# Patient Record
Sex: Female | Born: 1940
Health system: Southern US, Community
[De-identification: ages and names within clinical notes are randomized; demographics above are authoritative.]

## PROBLEM LIST (undated history)

## (undated) DIAGNOSIS — F32A Depression, unspecified: Secondary | ICD-10-CM

## (undated) DIAGNOSIS — R5383 Other fatigue: Secondary | ICD-10-CM

## (undated) DIAGNOSIS — M8580 Other specified disorders of bone density and structure, unspecified site: Secondary | ICD-10-CM

## (undated) DIAGNOSIS — M1990 Unspecified osteoarthritis, unspecified site: Secondary | ICD-10-CM

## (undated) DIAGNOSIS — F329 Major depressive disorder, single episode, unspecified: Secondary | ICD-10-CM

## (undated) DIAGNOSIS — M810 Age-related osteoporosis without current pathological fracture: Secondary | ICD-10-CM

## (undated) DIAGNOSIS — F419 Anxiety disorder, unspecified: Secondary | ICD-10-CM

## (undated) DIAGNOSIS — R51 Headache: Secondary | ICD-10-CM

## (undated) DIAGNOSIS — K219 Gastro-esophageal reflux disease without esophagitis: Secondary | ICD-10-CM

## (undated) DIAGNOSIS — G250 Essential tremor: Secondary | ICD-10-CM

## (undated) DIAGNOSIS — K648 Other hemorrhoids: Secondary | ICD-10-CM

## (undated) DIAGNOSIS — C4490 Unspecified malignant neoplasm of skin, unspecified: Secondary | ICD-10-CM

## (undated) DIAGNOSIS — G8929 Other chronic pain: Secondary | ICD-10-CM

## (undated) DIAGNOSIS — R519 Headache, unspecified: Secondary | ICD-10-CM

## (undated) DIAGNOSIS — R42 Dizziness and giddiness: Secondary | ICD-10-CM

## (undated) DIAGNOSIS — M545 Low back pain, unspecified: Secondary | ICD-10-CM

## (undated) DIAGNOSIS — E785 Hyperlipidemia, unspecified: Secondary | ICD-10-CM

## (undated) HISTORY — PX: APPENDECTOMY: SHX54

## (undated) HISTORY — DX: Other specified disorders of bone density and structure, unspecified site: M85.80

## (undated) HISTORY — PX: LAMINECTOMY: SHX219

## (undated) HISTORY — DX: Age-related osteoporosis without current pathological fracture: M81.0

## (undated) HISTORY — DX: Headache, unspecified: R51.9

## (undated) HISTORY — DX: Low back pain, unspecified: M54.50

## (undated) HISTORY — DX: Anxiety disorder, unspecified: F41.9

## (undated) HISTORY — PX: LUMBAR SPINE SURGERY: SHX701

## (undated) HISTORY — DX: Other hemorrhoids: K64.8

## (undated) HISTORY — DX: Other fatigue: R53.83

## (undated) HISTORY — PX: COLONOSCOPY: SHX174

## (undated) HISTORY — PX: OTHER SURGICAL HISTORY: SHX169

## (undated) HISTORY — PX: TONSILLECTOMY: SUR1361

## (undated) HISTORY — DX: Unspecified osteoarthritis, unspecified site: M19.90

## (undated) HISTORY — DX: Essential tremor: G25.0

## (undated) HISTORY — DX: Depression, unspecified: F32.A

## (undated) HISTORY — DX: Major depressive disorder, single episode, unspecified: F32.9

## (undated) HISTORY — DX: Unspecified malignant neoplasm of skin, unspecified: C44.90

## (undated) HISTORY — DX: Other chronic pain: G89.29

## (undated) HISTORY — DX: Dizziness and giddiness: R42

## (undated) HISTORY — DX: Gastro-esophageal reflux disease without esophagitis: K21.9

## (undated) HISTORY — DX: Hyperlipidemia, unspecified: E78.5

## (undated) HISTORY — DX: Headache: R51

## (undated) HISTORY — PX: EYE SURGERY: SHX253

## (undated) HISTORY — PX: ABDOMINAL HYSTERECTOMY: SHX81

---

## 2001-04-20 ENCOUNTER — Other Ambulatory Visit: Admission: RE | Admit: 2001-04-20 | Discharge: 2001-04-20 | Payer: Self-pay | Admitting: Gynecology

## 2005-01-18 ENCOUNTER — Other Ambulatory Visit: Admission: RE | Admit: 2005-01-18 | Discharge: 2005-01-18 | Payer: Self-pay | Admitting: Gynecology

## 2005-04-15 ENCOUNTER — Ambulatory Visit: Payer: Self-pay | Admitting: Internal Medicine

## 2005-04-26 ENCOUNTER — Ambulatory Visit: Payer: Self-pay | Admitting: Internal Medicine

## 2005-10-21 ENCOUNTER — Encounter: Admission: RE | Admit: 2005-10-21 | Discharge: 2005-10-21 | Payer: Self-pay | Admitting: Internal Medicine

## 2005-11-05 ENCOUNTER — Encounter: Admission: RE | Admit: 2005-11-05 | Discharge: 2005-11-05 | Payer: Self-pay | Admitting: Internal Medicine

## 2005-12-10 ENCOUNTER — Encounter: Admission: RE | Admit: 2005-12-10 | Discharge: 2005-12-10 | Payer: Self-pay | Admitting: Internal Medicine

## 2007-09-05 ENCOUNTER — Encounter: Admission: RE | Admit: 2007-09-05 | Discharge: 2007-09-05 | Payer: Self-pay | Admitting: Internal Medicine

## 2009-05-08 ENCOUNTER — Encounter: Admission: RE | Admit: 2009-05-08 | Discharge: 2009-05-08 | Payer: Self-pay | Admitting: Internal Medicine

## 2009-07-02 ENCOUNTER — Emergency Department (HOSPITAL_COMMUNITY): Admission: EM | Admit: 2009-07-02 | Discharge: 2009-07-02 | Payer: Self-pay | Admitting: Emergency Medicine

## 2009-07-04 ENCOUNTER — Encounter: Admission: RE | Admit: 2009-07-04 | Discharge: 2009-07-04 | Payer: Self-pay | Admitting: Emergency Medicine

## 2009-07-15 ENCOUNTER — Ambulatory Visit (HOSPITAL_COMMUNITY): Admission: RE | Admit: 2009-07-15 | Discharge: 2009-07-15 | Payer: Self-pay | Admitting: Neurosurgery

## 2010-03-19 ENCOUNTER — Encounter: Admission: RE | Admit: 2010-03-19 | Discharge: 2010-03-19 | Payer: Self-pay | Admitting: Internal Medicine

## 2010-12-26 ENCOUNTER — Encounter (HOSPITAL_BASED_OUTPATIENT_CLINIC_OR_DEPARTMENT_OTHER): Payer: Self-pay | Admitting: Internal Medicine

## 2011-03-13 LAB — CBC
HCT: 36.2 % (ref 36.0–46.0)
Hemoglobin: 12.2 g/dL (ref 12.0–15.0)
MCHC: 33.6 g/dL (ref 30.0–36.0)
MCV: 96 fL (ref 78.0–100.0)
Platelets: 162 10*3/uL (ref 150–400)
RBC: 3.77 MIL/uL — ABNORMAL LOW (ref 3.87–5.11)
RDW: 13 % (ref 11.5–15.5)
WBC: 5.6 10*3/uL (ref 4.0–10.5)

## 2011-04-20 NOTE — Op Note (Signed)
NAMESAMANTHAN, DUGO             ACCOUNT NO.:  [id number]   MEDICAL RECORD NO.:  [id number]          PATIENT TYPE:  OIB   LOCATION:  3526                         FACILITY:  [facility]   PHYSICIAN:  Reinaldo Meeker, M.D. DATE OF BIRTH:  21-Oct-1941   DATE OF PROCEDURE:  07/15/2009  DATE OF DISCHARGE:                               OPERATIVE REPORT   PREOPERATIVE DIAGNOSIS:  Herniated disk L4-5 right.   POSTOPERATIVE DIAGNOSIS:  Herniated disk L4-5 right.   PROCEDURE:  Right L4-5 interlaminar laminotomy for excision of herniated  disk with operative microscope.   SURGEON:  Reinaldo Meeker, M.D.   ASSISTANT:  Donalee Citrin, M.D.   PROCEDURE IN DETAIL:  After placing in the prone position, the patient's  back was prepped and draped in usual sterile fashion.  Localizing x-rays  taken prior to incision to identify the appropriate level.  Midline  incision was made above the spinous process of L4 and L5.  Using Bovie  cutting current, the incision was carried down the spinous processes.  Subperiosteal dissection was then carried out on the right side of the  spinous processes and lamina.  Self-retaining retractor was placed for  exposure.  X-rays showed approach to the appropriate level.  Using the  high-speed drill, the inferior one-third of the L4 lamina, medial one-  third of the facet joint and superior one-third of the L5 lamina were  removed.  Residual bone and ligamentum flavum removed in a piecemeal  fashion.  The microscope was draped and brought into the field and used  through the remainder of the case.  Using microdissection technique, the  lateral aspect of the thecal sac and L5 nerve were identified.  Further  coagulation was carried out down the floor of the canal to identify the  L4-5 disk which was found to be remarkably herniated.  It appeared  through the disk space, there was some free disk material and this was  removed.  The disk was then incised with a 15 blade and  thoroughly  cleaned out with pituitary rongeurs and curettes.  This time inspection  was carried out in all directions for any evidence of residual  compression and none could be identified.  Large amounts of irrigation  carried out and any bleeding controlled with bipolar coagulation and  Gelfoam.  The wound was then closed in multiple layers of Vicryl in the  muscle fascia, subcutaneous, subcuticular tissues and staples were  placed on the skin.  A sterile dressing was then applied, and the  patient was extubated and taken to recovery room in stable condition.           ______________________________  Reinaldo Meeker, M.D.     ROK/MEDQ  D:  07/15/2009  T:  07/15/2009  Job:  [id number]

## 2011-08-11 ENCOUNTER — Other Ambulatory Visit: Payer: Self-pay | Admitting: Otolaryngology

## 2011-08-13 ENCOUNTER — Ambulatory Visit
Admission: RE | Admit: 2011-08-13 | Discharge: 2011-08-13 | Disposition: A | Discharge status: Home / Self Care | Payer: PRIVATE HEALTH INSURANCE | Source: Ambulatory Visit | Attending: Otolaryngology | Admitting: Otolaryngology

## 2012-01-12 ENCOUNTER — Other Ambulatory Visit (HOSPITAL_COMMUNITY): Payer: Self-pay | Admitting: Internal Medicine

## 2012-01-12 DIAGNOSIS — Z139 Encounter for screening, unspecified: Secondary | ICD-10-CM

## 2012-01-17 ENCOUNTER — Ambulatory Visit (HOSPITAL_COMMUNITY)
Admission: RE | Admit: 2012-01-17 | Discharge: 2012-01-17 | Disposition: A | Discharge status: Home / Self Care | Payer: Medicare Other | Source: Ambulatory Visit | Attending: Internal Medicine | Admitting: Internal Medicine

## 2012-01-17 DIAGNOSIS — Z139 Encounter for screening, unspecified: Secondary | ICD-10-CM

## 2012-01-17 DIAGNOSIS — Z1231 Encounter for screening mammogram for malignant neoplasm of breast: Secondary | ICD-10-CM | POA: Insufficient documentation

## 2012-08-02 DIAGNOSIS — Z79899 Other long term (current) drug therapy: Secondary | ICD-10-CM | POA: Diagnosis not present

## 2012-08-02 DIAGNOSIS — E785 Hyperlipidemia, unspecified: Secondary | ICD-10-CM | POA: Diagnosis not present

## 2012-08-02 DIAGNOSIS — E039 Hypothyroidism, unspecified: Secondary | ICD-10-CM | POA: Diagnosis not present

## 2012-08-09 DIAGNOSIS — Z0000 Encounter for general adult medical examination without abnormal findings: Secondary | ICD-10-CM | POA: Diagnosis not present

## 2012-08-09 DIAGNOSIS — Z23 Encounter for immunization: Secondary | ICD-10-CM | POA: Diagnosis not present

## 2012-08-09 DIAGNOSIS — M899 Disorder of bone, unspecified: Secondary | ICD-10-CM | POA: Diagnosis not present

## 2012-08-09 DIAGNOSIS — E785 Hyperlipidemia, unspecified: Secondary | ICD-10-CM | POA: Diagnosis not present

## 2012-08-09 DIAGNOSIS — R1312 Dysphagia, oropharyngeal phase: Secondary | ICD-10-CM | POA: Diagnosis not present

## 2012-08-31 ENCOUNTER — Encounter: Payer: Self-pay | Admitting: *Deleted

## 2012-09-01 ENCOUNTER — Encounter: Payer: Self-pay | Admitting: Internal Medicine

## 2012-09-19 ENCOUNTER — Encounter: Payer: Self-pay | Admitting: Internal Medicine

## 2012-09-19 ENCOUNTER — Ambulatory Visit (INDEPENDENT_AMBULATORY_CARE_PROVIDER_SITE_OTHER): Payer: Medicare Other | Admitting: Internal Medicine

## 2012-09-19 VITALS — BP 140/80 | HR 68 | Ht 66.5 in | Wt 135.4 lb

## 2012-09-19 DIAGNOSIS — K219 Gastro-esophageal reflux disease without esophagitis: Secondary | ICD-10-CM | POA: Diagnosis not present

## 2012-09-19 MED ORDER — OMEPRAZOLE 40 MG PO CPDR: 40.0000 mg | DELAYED_RELEASE_CAPSULE | Freq: Every day | ORAL | Status: DC

## 2012-09-19 NOTE — Patient Instructions (Addendum)
You have been scheduled for an endoscopy with propofol. Please follow written instructions given to you at your visit today. If you use inhalers (even only as needed), please bring them with you on the day of your procedure. We have sent the following medications to your pharmacy for you to pick up at your convenience: Omeprazole CC: Dr Jarome Matin

## 2012-09-19 NOTE — Progress Notes (Signed)
Suzanne Leon 1941-02-25 MRN [id number]        History of Present Illness:  This is a 71 year old white female with  solid food dysphagia. This has been getting gradually worse for past several years. We saw her in 2006 for screening colonoscopy and constipation. She was found to have internal hemorrhoids. Barium esophagram in September 2012 showed normal peristalsis. No reflux. 13 mm tablet past without delay. Her complaints are mostly related to meals. Several minutes after finishing meal the food seems to regurgitate  all the way up to her mouth; this is worse if she bends over. She denies nocturnal cough or hoarseness. Her barium swallow showed  transient silent laryngeal penetration. She avoids taking anti-inflammatory agents because of heartburn. She has been under a lot of stress due. to  her daughter's illness. She  recently passed away. Patient  is about 10 pounds below her usual weight. She has involuntary tremor involving her head and neck which was evaluated by Dr. Sandria Manly about 8 years ago but no definite diagnosis was made, she denies having any neurological diseases such  MS stroke or neuropathy. She describes severe burning substernally after food and acid.regurgitates. She has taken TUMS and Rolaids with only partial relief  Past Medical History  Diagnosis Date  . Internal hemorrhoids   . Anxiety   . Skin cancer   . Chronic headaches   . Depression   . HLD (hyperlipidemia)    Past Surgical History  Procedure Date  . Laminectomy   . Appendectomy   . Abdominal hysterectomy   . Tonsillectomy     reports that she has quit smoking. Her smoking use included Cigarettes. She has never used smokeless tobacco. She reports that she does not drink alcohol or use illicit drugs. family history includes Breast cancer in her daughter; Throat cancer in her mother; and Ulcers in her father. No Known Allergies      Review of Systems:  The remainder of the 10 point ROS is negative  except as outlined in H&P   Physical Exam: General appearance  Well developed, in no distress. Eyes- non icteric. HEENT nontraumatic, normocephalic. Mouth no lesions, tongue papillated, no cheilosis. Neck supple without adenopathy, thyroid not enlarged, no carotid bruits, no JVD. Lungs Clear to auscultation bilaterally. Cor normal S1, normal S2, regular rhythm, no murmur,  quiet precordium. Abdomen: soft, non tender, active bowl sounds Rectal: Not done Extremities no pedal edema. Skin no lesions. Neurological alert and oriented x 3. Voluntary bobbing of her head, no hand or leg tremor. Psychological normal mood and affect.  Assessment and Plan:  Dysphagia and substernal burning  consistent with severe gastroesophageal reflux. This has developed gradually over past several years and it is consistent with esophageal dysmotility in absence of esophageal stricture on Barium esophagram.. Her barium esophagram one year of old was completely normal which is hard to believe. We will start her on Prilosec 20 mg twice a day. I have given her samples of Prilosec because she doesn't have any insurance coverage. We will schedule her for upper endoscopy to rule out Barrett's esophagus and to assess her gastric outlet to rule out GOO. She may in the future benefit from gastric emptying scan to r/o gastroparesis.. I would be reluctant to put her on metoclopramide at this point because of her neurological symptoms. Depending on the findings on the upper endoscopy I would consider adding Carafate or antacids for additional coating affect. She has been already  elevating the head of the  bed at night .Esophageal manometry may be indicated in order to assess esophageal peristalsis..She may need to follow up with Dr Sandria Manly with respect to a neurological disorder underlying her tremor .   09/19/2012 [name]

## 2012-10-03 DIAGNOSIS — M810 Age-related osteoporosis without current pathological fracture: Secondary | ICD-10-CM | POA: Diagnosis not present

## 2012-10-03 DIAGNOSIS — R1312 Dysphagia, oropharyngeal phase: Secondary | ICD-10-CM | POA: Diagnosis not present

## 2012-11-14 ENCOUNTER — Telehealth: Payer: Self-pay | Admitting: *Deleted

## 2012-11-14 ENCOUNTER — Encounter: Payer: Self-pay | Admitting: Internal Medicine

## 2012-11-14 ENCOUNTER — Other Ambulatory Visit: Payer: Self-pay | Admitting: *Deleted

## 2012-11-14 ENCOUNTER — Ambulatory Visit (AMBULATORY_SURGERY_CENTER): Payer: Medicare Other | Admitting: Internal Medicine

## 2012-11-14 VITALS — BP 121/62 | HR 54 | Temp 98.0°F | Resp 22 | Ht 66.0 in | Wt 135.0 lb

## 2012-11-14 DIAGNOSIS — K219 Gastro-esophageal reflux disease without esophagitis: Secondary | ICD-10-CM | POA: Diagnosis not present

## 2012-11-14 DIAGNOSIS — K2960 Other gastritis without bleeding: Secondary | ICD-10-CM

## 2012-11-14 DIAGNOSIS — F341 Dysthymic disorder: Secondary | ICD-10-CM | POA: Diagnosis not present

## 2012-11-14 DIAGNOSIS — R1319 Other dysphagia: Secondary | ICD-10-CM | POA: Diagnosis not present

## 2012-11-14 DIAGNOSIS — E785 Hyperlipidemia, unspecified: Secondary | ICD-10-CM | POA: Diagnosis not present

## 2012-11-14 MED ORDER — SODIUM CHLORIDE 0.9 % IV SOLN: 500.0000 mL | INTRAVENOUS | Status: DC

## 2012-11-14 NOTE — Progress Notes (Signed)
Called to room to assist during endoscopic procedure.  Patient ID and intended procedure confirmed with present staff. Received instructions for my participation in the procedure from the performing physician.  

## 2012-11-14 NOTE — Op Note (Signed)
[facility] [address] [place], [address]   ENDOSCOPY PROCEDURE REPORT  PATIENT: Suzanne Leon, Suzanne Leon  MR#: [id number] BIRTHDATE: Jan 01, 1941 , 71  yrs. old GENDER: Female ENDOSCOPIST: Hart Carwin, MD REFERRED BY:  Jarome Matin, M.D. PROCEDURE DATE:  11/14/2012 PROCEDURE:  EGD w/ biopsy and Maloney dilation of esophagus ASA CLASS:     Class III INDICATIONS:  Dysphagia.   History of esophageal reflux.   barium swallow recently showed ? penetration, GERD,. MEDICATIONS: MAC sedation, administered by CRNA and propofol (Diprivan) 200mg  IV TOPICAL ANESTHETIC: none  DESCRIPTION OF PROCEDURE: After the risks benefits and alternatives of the procedure were thoroughly explained, informed consent was obtained.  The LB GIF-H180 [id number] endoscope was introduced through the mouth and advanced to the second portion of the duodenum. Without limitations.  The instrument was slowly withdrawn as the mucosa was fully examined.      esophageal lumen was normal. There was no retained food or secretions. There was no evidence of esophageal stricture. Endoscope traversed into the stomach without resistance while acutely and piriform sinuses appeared normal the gastric mucosa was normal in the proximal portion. In the gastric antrum there were scattered erosions in the prepyloric antrum. Biopsies were taken to rule out H. pylori duodenal bulb and descending duodenum was normal Maloney dilators 48 [place] passed through esophagus without resistance there was no blood on the dilator      Retroflexed views revealed no abnormalities.     The scope was then withdrawn from the patient and the procedure completed.  COMPLICATIONS: There were no complications. ENDOSCOPIC IMPRESSION: essentially normal upper endoscopy without evidence of esophageal stricture ,status post passage of 48 French Maloney dilator without resistance Mild antral gastritis. Status post biopsies to rule out  H. pylori RECOMMENDATIONS: 1.  Anti-reflux regimen to be follow 2.  Continue PPI 3. schedule esophageal manometry to r/o dysmotility 4 .await biopsies  REPEAT EXAM: no recall EGD  eSigned:  Hart Carwin, MD 11/14/2012 1:38 PM   CC:  PATIENT NAME:  [name], [name] MR#: [id number]

## 2012-11-14 NOTE — Progress Notes (Signed)
Pt. Scheduled for esophageal manometry, January 13.  Instructed to not eat or drink anything after midnight prior to test. Location is [facility].   Patient did not experience any of the following events: a burn prior to discharge; a fall within the facility; wrong site/side/patient/procedure/implant event; or a hospital transfer or hospital admission upon discharge from the facility. [phone]) Patient did not have preoperative order for IV antibiotic SSI prophylaxis. [phone])

## 2012-11-14 NOTE — Patient Instructions (Addendum)
YOU HAD AN ENDOSCOPIC PROCEDURE TODAY AT THE [facility]: Refer to the procedure report that was given to you for any specific questions about what was found during the examination.  If the procedure report does not answer your questions, please call your gastroenterologist to clarify.  If you requested that your care partner not be given the details of your procedure findings, then the procedure report has been included in a sealed envelope for you to review at your convenience later.  YOU SHOULD EXPECT: Some feelings of bloating in the abdomen. Passage of more gas than usual.  Walking can help get rid of the air that was put into your GI tract during the procedure and reduce the bloating. If you had a lower endoscopy (such as a colonoscopy or flexible sigmoidoscopy) you may notice spotting of blood in your stool or on the toilet paper. If you underwent a bowel prep for your procedure, then you may not have a normal bowel movement for a few days.  DIET: Your first meal following the procedure should be a light meal and then it is ok to progress to your normal diet.  A half-sandwich or bowl of soup is an example of a good first meal.  Heavy or fried foods are harder to digest and may make you feel nauseous or bloated.  Likewise meals heavy in dairy and vegetables can cause extra gas to form and this can also increase the bloating.  Drink plenty of fluids but you should avoid alcoholic beverages for 24 hours.  ACTIVITY: Your care partner should take you home directly after the procedure.  You should plan to take it easy, moving slowly for the rest of the day.  You can resume normal activity the day after the procedure however you should NOT DRIVE or use heavy machinery for 24 hours (because of the sedation medicines used during the test).    SYMPTOMS TO REPORT IMMEDIATELY: A gastroenterologist can be reached at any hour.  During normal business hours, 8:30 AM to 5:00 PM Monday through Friday,  call [phone].  After hours and on weekends, please call the GI answering service at [phone] who will take a message and have the physician on call contact you.    Following upper endoscopy (EGD)  Vomiting of blood or coffee ground material  New chest pain or pain under the shoulder blades  Painful or persistently difficult swallowing  New shortness of breath  Fever of 100F or higher  Black, tarry-looking stools  FOLLOW UP: If any biopsies were taken you will be contacted by phone or by letter within the next 1-3 weeks.  Call your gastroenterologist if you have not heard about the biopsies in 3 weeks.  Our staff will call the home number listed on your records the next business day following your procedure to check on you and address any questions or concerns that you may have at that time regarding the information given to you following your procedure. This is a courtesy call and so if there is no answer at the home number and we have not heard from you through the emergency physician on call, we will assume that you have returned to your regular daily activities without incident.  SIGNATURES/CONFIDENTIALITY: You and/or your care partner have signed paperwork which will be entered into your electronic medical record.  These signatures attest to the fact that that the information above on your After Visit Summary has been reviewed and is understood.  Full   responsibility of the confidentiality of this discharge information lies with you and/or your care-partner.    Post dilation diet given with instructions  Gastritis information given.  Take Prilosec twice a day  Esophageal manometry to be scheduled.

## 2012-11-14 NOTE — Telephone Encounter (Signed)
Per Dr. Juanda Chance, patient needs esophageal manometry. Scheduled at [facility] endo Noreene Larsson) on 12/18/12 at 10:15 AM. NPO after midnight. Booking number [id number]. Erskine Squibb in [facility] notified and will give patient instructions.

## 2012-11-15 ENCOUNTER — Telehealth: Payer: Self-pay

## 2012-11-15 NOTE — Telephone Encounter (Signed)
  Follow up Call-  Call back number 11/14/2012  Post procedure Call Back phone  # [phone]  Permission to leave phone message Yes     Patient questions:  Do you have a fever, pain , or abdominal swelling? no Pain Score  0 *  Have you tolerated food without any problems? yes  Have you been able to return to your normal activities? yes  Do you have any questions about your discharge instructions: Diet   no Medications  no Follow up visit  no  Do you have questions or concerns about your Care? no  Actions: * If pain score is 4 or above: No action needed, pain <4.

## 2012-11-20 ENCOUNTER — Encounter: Payer: Self-pay | Admitting: Internal Medicine

## 2012-12-18 ENCOUNTER — Ambulatory Visit (HOSPITAL_COMMUNITY)
Admission: RE | Admit: 2012-12-18 | Discharge: 2012-12-18 | Disposition: A | Discharge status: Home / Self Care | Payer: Medicare Other | Source: Ambulatory Visit | Attending: Internal Medicine | Admitting: Internal Medicine

## 2012-12-18 ENCOUNTER — Encounter (HOSPITAL_COMMUNITY)
Admission: RE | Disposition: A | Discharge status: Home / Self Care | Payer: Self-pay | Source: Ambulatory Visit | Attending: Internal Medicine

## 2012-12-18 DIAGNOSIS — R1319 Other dysphagia: Secondary | ICD-10-CM

## 2012-12-18 DIAGNOSIS — K219 Gastro-esophageal reflux disease without esophagitis: Secondary | ICD-10-CM

## 2012-12-18 DIAGNOSIS — R1310 Dysphagia, unspecified: Secondary | ICD-10-CM | POA: Diagnosis not present

## 2012-12-18 HISTORY — PX: ESOPHAGEAL MANOMETRY: SHX5429

## 2012-12-18 SURGERY — MANOMETRY, ESOPHAGUS

## 2012-12-18 MED ORDER — LIDOCAINE VISCOUS 2 % MT SOLN
OROMUCOSAL | Status: AC
  Filled 2012-12-18: qty 15

## 2012-12-19 ENCOUNTER — Encounter (HOSPITAL_COMMUNITY): Payer: Self-pay | Admitting: Internal Medicine

## 2013-03-29 DIAGNOSIS — IMO0002 Reserved for concepts with insufficient information to code with codable children: Secondary | ICD-10-CM | POA: Diagnosis not present

## 2013-03-29 DIAGNOSIS — M545 Low back pain, unspecified: Secondary | ICD-10-CM | POA: Diagnosis not present

## 2013-03-29 DIAGNOSIS — F411 Generalized anxiety disorder: Secondary | ICD-10-CM | POA: Diagnosis not present

## 2013-03-29 DIAGNOSIS — R1312 Dysphagia, oropharyngeal phase: Secondary | ICD-10-CM | POA: Diagnosis not present

## 2013-03-29 DIAGNOSIS — M810 Age-related osteoporosis without current pathological fracture: Secondary | ICD-10-CM | POA: Diagnosis not present

## 2013-08-09 DIAGNOSIS — E785 Hyperlipidemia, unspecified: Secondary | ICD-10-CM | POA: Diagnosis not present

## 2013-08-09 DIAGNOSIS — M810 Age-related osteoporosis without current pathological fracture: Secondary | ICD-10-CM | POA: Diagnosis not present

## 2013-08-09 DIAGNOSIS — E039 Hypothyroidism, unspecified: Secondary | ICD-10-CM | POA: Diagnosis not present

## 2013-08-09 DIAGNOSIS — Z79899 Other long term (current) drug therapy: Secondary | ICD-10-CM | POA: Diagnosis not present

## 2013-08-16 DIAGNOSIS — Z1331 Encounter for screening for depression: Secondary | ICD-10-CM | POA: Diagnosis not present

## 2013-08-16 DIAGNOSIS — IMO0002 Reserved for concepts with insufficient information to code with codable children: Secondary | ICD-10-CM | POA: Diagnosis not present

## 2013-08-16 DIAGNOSIS — F411 Generalized anxiety disorder: Secondary | ICD-10-CM | POA: Diagnosis not present

## 2013-08-16 DIAGNOSIS — R1312 Dysphagia, oropharyngeal phase: Secondary | ICD-10-CM | POA: Diagnosis not present

## 2013-08-16 DIAGNOSIS — Z79899 Other long term (current) drug therapy: Secondary | ICD-10-CM | POA: Diagnosis not present

## 2013-08-16 DIAGNOSIS — M545 Low back pain, unspecified: Secondary | ICD-10-CM | POA: Diagnosis not present

## 2013-08-16 DIAGNOSIS — E039 Hypothyroidism, unspecified: Secondary | ICD-10-CM | POA: Diagnosis not present

## 2013-08-16 DIAGNOSIS — R5381 Other malaise: Secondary | ICD-10-CM | POA: Diagnosis not present

## 2013-08-16 DIAGNOSIS — M810 Age-related osteoporosis without current pathological fracture: Secondary | ICD-10-CM | POA: Diagnosis not present

## 2013-08-16 DIAGNOSIS — E785 Hyperlipidemia, unspecified: Secondary | ICD-10-CM | POA: Diagnosis not present

## 2013-08-17 DIAGNOSIS — Z1212 Encounter for screening for malignant neoplasm of rectum: Secondary | ICD-10-CM | POA: Diagnosis not present

## 2013-11-16 DIAGNOSIS — IMO0001 Reserved for inherently not codable concepts without codable children: Secondary | ICD-10-CM | POA: Diagnosis not present

## 2013-11-16 DIAGNOSIS — E785 Hyperlipidemia, unspecified: Secondary | ICD-10-CM | POA: Diagnosis not present

## 2013-11-16 DIAGNOSIS — IMO0002 Reserved for concepts with insufficient information to code with codable children: Secondary | ICD-10-CM | POA: Diagnosis not present

## 2013-11-16 DIAGNOSIS — M810 Age-related osteoporosis without current pathological fracture: Secondary | ICD-10-CM | POA: Diagnosis not present

## 2013-12-21 ENCOUNTER — Other Ambulatory Visit (HOSPITAL_COMMUNITY): Payer: Self-pay | Admitting: Internal Medicine

## 2013-12-21 DIAGNOSIS — Z139 Encounter for screening, unspecified: Secondary | ICD-10-CM

## 2013-12-27 ENCOUNTER — Ambulatory Visit (HOSPITAL_COMMUNITY)
Admission: RE | Admit: 2013-12-27 | Discharge: 2013-12-27 | Disposition: A | Discharge status: Home / Self Care | Payer: Medicare Other | Source: Ambulatory Visit | Attending: Internal Medicine | Admitting: Internal Medicine

## 2013-12-27 DIAGNOSIS — Z1231 Encounter for screening mammogram for malignant neoplasm of breast: Secondary | ICD-10-CM | POA: Insufficient documentation

## 2013-12-27 DIAGNOSIS — Z139 Encounter for screening, unspecified: Secondary | ICD-10-CM

## 2014-06-03 DIAGNOSIS — J029 Acute pharyngitis, unspecified: Secondary | ICD-10-CM | POA: Diagnosis not present

## 2014-06-05 ENCOUNTER — Other Ambulatory Visit: Payer: Self-pay | Admitting: Otolaryngology

## 2014-06-05 DIAGNOSIS — J029 Acute pharyngitis, unspecified: Secondary | ICD-10-CM

## 2014-06-10 ENCOUNTER — Ambulatory Visit
Admission: RE | Admit: 2014-06-10 | Discharge: 2014-06-10 | Disposition: A | Discharge status: Home / Self Care | Payer: Medicare Other | Source: Ambulatory Visit | Attending: Otolaryngology | Admitting: Otolaryngology

## 2014-06-10 DIAGNOSIS — J029 Acute pharyngitis, unspecified: Secondary | ICD-10-CM | POA: Diagnosis not present

## 2014-06-10 MED ORDER — IOHEXOL 300 MG/ML  SOLN
75.0000 mL | Freq: Once | INTRAMUSCULAR | Status: AC | PRN
  Administered 2014-06-10: 75 mL via INTRAVENOUS

## 2014-07-23 DIAGNOSIS — J029 Acute pharyngitis, unspecified: Secondary | ICD-10-CM | POA: Diagnosis not present

## 2014-08-21 DIAGNOSIS — R809 Proteinuria, unspecified: Secondary | ICD-10-CM | POA: Diagnosis not present

## 2014-08-21 DIAGNOSIS — R82998 Other abnormal findings in urine: Secondary | ICD-10-CM | POA: Diagnosis not present

## 2014-08-21 DIAGNOSIS — Z79899 Other long term (current) drug therapy: Secondary | ICD-10-CM | POA: Diagnosis not present

## 2014-08-21 DIAGNOSIS — E039 Hypothyroidism, unspecified: Secondary | ICD-10-CM | POA: Diagnosis not present

## 2014-08-21 DIAGNOSIS — M810 Age-related osteoporosis without current pathological fracture: Secondary | ICD-10-CM | POA: Diagnosis not present

## 2014-08-21 DIAGNOSIS — E785 Hyperlipidemia, unspecified: Secondary | ICD-10-CM | POA: Diagnosis not present

## 2014-08-28 DIAGNOSIS — Z1212 Encounter for screening for malignant neoplasm of rectum: Secondary | ICD-10-CM | POA: Diagnosis not present

## 2014-08-28 DIAGNOSIS — E039 Hypothyroidism, unspecified: Secondary | ICD-10-CM | POA: Diagnosis not present

## 2014-08-28 DIAGNOSIS — R1312 Dysphagia, oropharyngeal phase: Secondary | ICD-10-CM | POA: Diagnosis not present

## 2014-08-28 DIAGNOSIS — Z0000 Encounter for general adult medical examination without abnormal findings: Secondary | ICD-10-CM | POA: Diagnosis not present

## 2014-08-28 DIAGNOSIS — Z23 Encounter for immunization: Secondary | ICD-10-CM | POA: Diagnosis not present

## 2014-08-28 DIAGNOSIS — E785 Hyperlipidemia, unspecified: Secondary | ICD-10-CM | POA: Diagnosis not present

## 2014-08-28 DIAGNOSIS — F411 Generalized anxiety disorder: Secondary | ICD-10-CM | POA: Diagnosis not present

## 2014-08-28 DIAGNOSIS — IMO0001 Reserved for inherently not codable concepts without codable children: Secondary | ICD-10-CM | POA: Diagnosis not present

## 2014-08-28 DIAGNOSIS — Z1331 Encounter for screening for depression: Secondary | ICD-10-CM | POA: Diagnosis not present

## 2014-08-28 DIAGNOSIS — Z79899 Other long term (current) drug therapy: Secondary | ICD-10-CM | POA: Diagnosis not present

## 2014-08-28 DIAGNOSIS — M810 Age-related osteoporosis without current pathological fracture: Secondary | ICD-10-CM | POA: Diagnosis not present

## 2014-10-03 DIAGNOSIS — M2550 Pain in unspecified joint: Secondary | ICD-10-CM | POA: Diagnosis not present

## 2014-10-03 DIAGNOSIS — M791 Myalgia: Secondary | ICD-10-CM | POA: Diagnosis not present

## 2014-10-03 DIAGNOSIS — F419 Anxiety disorder, unspecified: Secondary | ICD-10-CM | POA: Diagnosis not present

## 2014-10-03 DIAGNOSIS — R5382 Chronic fatigue, unspecified: Secondary | ICD-10-CM | POA: Diagnosis not present

## 2014-12-11 DIAGNOSIS — M2550 Pain in unspecified joint: Secondary | ICD-10-CM | POA: Diagnosis not present

## 2014-12-11 DIAGNOSIS — F419 Anxiety disorder, unspecified: Secondary | ICD-10-CM | POA: Diagnosis not present

## 2014-12-11 DIAGNOSIS — M791 Myalgia: Secondary | ICD-10-CM | POA: Diagnosis not present

## 2014-12-11 DIAGNOSIS — R5382 Chronic fatigue, unspecified: Secondary | ICD-10-CM | POA: Diagnosis not present

## 2014-12-13 DIAGNOSIS — R110 Nausea: Secondary | ICD-10-CM | POA: Diagnosis not present

## 2014-12-13 DIAGNOSIS — R5381 Other malaise: Secondary | ICD-10-CM | POA: Diagnosis not present

## 2015-02-25 ENCOUNTER — Encounter: Payer: Self-pay | Admitting: Internal Medicine

## 2015-07-25 ENCOUNTER — Encounter (HOSPITAL_COMMUNITY): Payer: Self-pay | Admitting: Nurse Practitioner

## 2015-07-25 ENCOUNTER — Emergency Department (HOSPITAL_COMMUNITY)
Admission: EM | Admit: 2015-07-25 | Discharge: 2015-07-25 | Disposition: A | Discharge status: Home / Self Care | Payer: Self-pay

## 2015-07-25 ENCOUNTER — Emergency Department (HOSPITAL_COMMUNITY)
Admission: EM | Admit: 2015-07-25 | Discharge: 2015-07-25 | Disposition: A | Discharge status: Home / Self Care | Payer: Medicare Other | Attending: Emergency Medicine | Admitting: Emergency Medicine

## 2015-07-25 DIAGNOSIS — Y998 Other external cause status: Secondary | ICD-10-CM | POA: Diagnosis not present

## 2015-07-25 DIAGNOSIS — S81831A Puncture wound without foreign body, right lower leg, initial encounter: Secondary | ICD-10-CM | POA: Diagnosis not present

## 2015-07-25 DIAGNOSIS — Z8739 Personal history of other diseases of the musculoskeletal system and connective tissue: Secondary | ICD-10-CM | POA: Insufficient documentation

## 2015-07-25 DIAGNOSIS — Y9289 Other specified places as the place of occurrence of the external cause: Secondary | ICD-10-CM | POA: Insufficient documentation

## 2015-07-25 DIAGNOSIS — Z79899 Other long term (current) drug therapy: Secondary | ICD-10-CM | POA: Insufficient documentation

## 2015-07-25 DIAGNOSIS — E785 Hyperlipidemia, unspecified: Secondary | ICD-10-CM | POA: Diagnosis not present

## 2015-07-25 DIAGNOSIS — Z85828 Personal history of other malignant neoplasm of skin: Secondary | ICD-10-CM | POA: Diagnosis not present

## 2015-07-25 DIAGNOSIS — Z23 Encounter for immunization: Secondary | ICD-10-CM | POA: Insufficient documentation

## 2015-07-25 DIAGNOSIS — G8929 Other chronic pain: Secondary | ICD-10-CM | POA: Diagnosis not present

## 2015-07-25 DIAGNOSIS — W5501XA Bitten by cat, initial encounter: Secondary | ICD-10-CM | POA: Insufficient documentation

## 2015-07-25 DIAGNOSIS — S81851A Open bite, right lower leg, initial encounter: Secondary | ICD-10-CM | POA: Diagnosis not present

## 2015-07-25 DIAGNOSIS — Y9389 Activity, other specified: Secondary | ICD-10-CM | POA: Insufficient documentation

## 2015-07-25 DIAGNOSIS — F419 Anxiety disorder, unspecified: Secondary | ICD-10-CM | POA: Diagnosis not present

## 2015-07-25 DIAGNOSIS — K219 Gastro-esophageal reflux disease without esophagitis: Secondary | ICD-10-CM | POA: Diagnosis not present

## 2015-07-25 DIAGNOSIS — Z87891 Personal history of nicotine dependence: Secondary | ICD-10-CM | POA: Insufficient documentation

## 2015-07-25 MED ORDER — HYDROCODONE-ACETAMINOPHEN 5-325 MG PO TABS
1.0000 | ORAL_TABLET | Freq: Once | ORAL | Status: AC
  Administered 2015-07-25: 1 via ORAL
  Filled 2015-07-25: qty 1

## 2015-07-25 MED ORDER — RABIES VACCINE, PCEC IM SUSR
1.0000 mL | Freq: Once | INTRAMUSCULAR | Status: AC
  Administered 2015-07-25: 1 mL via INTRAMUSCULAR
  Filled 2015-07-25: qty 1

## 2015-07-25 MED ORDER — TETANUS-DIPHTH-ACELL PERTUSSIS 5-2.5-18.5 LF-MCG/0.5 IM SUSP
0.5000 mL | Freq: Once | INTRAMUSCULAR | Status: AC
  Administered 2015-07-25: 0.5 mL via INTRAMUSCULAR
  Filled 2015-07-25: qty 0.5

## 2015-07-25 MED ORDER — AMOXICILLIN-POT CLAVULANATE 875-125 MG PO TABS
1.0000 | ORAL_TABLET | Freq: Once | ORAL | Status: AC
  Administered 2015-07-25: 1 via ORAL
  Filled 2015-07-25: qty 1

## 2015-07-25 MED ORDER — AMOXICILLIN-POT CLAVULANATE 875-125 MG PO TABS: 1.0000 | ORAL_TABLET | Freq: Two times a day (BID) | ORAL | Status: DC

## 2015-07-25 MED ORDER — RABIES IMMUNE GLOBULIN 150 UNIT/ML IM INJ
20.0000 [IU]/kg | INJECTION | Freq: Once | INTRAMUSCULAR | Status: AC
Start: 2015-07-25 — End: 2015-07-25
  Administered 2015-07-25: 1275 [IU]
  Filled 2015-07-25: qty 8.5

## 2015-07-25 NOTE — Discharge Instructions (Signed)
Wash the affected area with soap and water and apply a thin layer of topical antibiotic ointment. Do this every 12 hours.   Do not use rubbing alcohol or hydrogen peroxide.                        Look for signs of infection: if you see redness, if the area becomes warm, if pain increases sharply, there is discharge (pus), if red streaks appear or you develop fever or vomiting, RETURN immediately to the Emergency Department  for a recheck.   Take your antibiotics as directed and to completion. You should never have any leftover antibiotics! Push fluids and stay well hydrated.   Go to the urgent care on [facility] for rabies vaccinations as follows:   DAY 3:  07/28/2015     To:  Urgent Care  DAY 7:  08/01/2015     To:  Urgent Care  DAY 14: 08/08/2015      To:  Urgent Care  Please follow with your primary care doctor in the next 2 days for a check-up. They must obtain records for further management.   Do not hesitate to return to the Emergency Department for any new, worsening or concerning symptoms.

## 2015-07-25 NOTE — ED Provider Notes (Signed)
CSN: [id number]     Arrival date & time 07/25/15  1761 History  This chart was scribed for Monico Blitz, PA-C, working with Evelina Bucy, MD by Starleen Arms, ED Scribe. This patient was seen in room TR07C/TR07C and the patient's care was started at 8:07 PM.   Chief Complaint  Patient presents with  . Animal Bite   The history is provided by the patient. No language interpreter was used.   HPI Comments: Suzanne Leon is a 74 y.o. female who presents to the Emergency Department complaining of Bite to right lower extremity occurring several hours prior to arrival. Patient was eating a stray cat, she said it several times in the past, she was petting it is it was eating and it became aggressive and bit her on the right calf. States that her last tetanus shot is unknown. She states that she tried to obtain the cat but that ran away. Pain is minimal.  Past Medical History  Diagnosis Date  . Internal hemorrhoids   . Anxiety   . Skin cancer   . Chronic headaches   . Depression   . HLD (hyperlipidemia)   . Arthritis   . GERD (gastroesophageal reflux disease)   . Osteoporosis    Past Surgical History  Procedure Laterality Date  . Laminectomy    . Appendectomy    . Abdominal hysterectomy    . Tonsillectomy    . Colonoscopy    . Esophageal manometry  12/18/2012    Procedure: ESOPHAGEAL MANOMETRY (EM);  Surgeon: Lafayette Dragon, MD;  Location: [facility];  Service: Endoscopy;  Laterality: N/A;   Family History  Problem Relation Age of Onset  . Breast cancer Daughter   . Ulcers Father     peptic  . Throat cancer Mother    Social History  Substance Use Topics  . Smoking status: Former Smoker    Types: Cigarettes  . Smokeless tobacco: Never Used  . Alcohol Use: No   OB History    No data available     Review of Systems  Skin: Positive for wound.      Allergies  Review of patient's allergies indicates no known allergies.  Home Medications   Prior to Admission  medications   Medication Sig Start Date End Date Taking? Authorizing Provider  ALPRAZolam (XANAX) 0.25 MG tablet Take 0.25 mg by mouth at bedtime as needed.    Historical Provider, MD  amoxicillin-clavulanate (AUGMENTIN) 875-125 MG per tablet Take 1 tablet by mouth 2 (two) times daily. One po bid x 7 days 07/25/15   Elmyra Ricks Rashawn Rolon, PA-C  omeprazole (PRILOSEC) 40 MG capsule Take 1 capsule (40 mg total) by mouth daily. 09/19/12   Lafayette Dragon, MD  propranolol (INDERAL) 10 MG tablet Take 10 mg by mouth as needed.    Historical Provider, MD  simvastatin (ZOCOR) 80 MG tablet Take 80 mg by mouth daily.    Historical Provider, MD   BP 150/68 mmHg  Pulse 73  Temp(Src) 98 F (36.7 C) (Oral)  Resp 16  Ht 5\' 8"  (1.727 m)  Wt 138 lb (62.596 kg)  BMI 20.99 kg/m2  SpO2 96% Physical Exam  Constitutional: She is oriented to person, place, and time. She appears well-developed and well-nourished. No distress.  HENT:  Head: Normocephalic and atraumatic.  Eyes: Conjunctivae and EOM are normal.  Neck: Neck supple. No tracheal deviation present.  Cardiovascular: Normal rate and regular rhythm.   Pulmonary/Chest: Effort normal and breath sounds normal. No respiratory  distress.  Musculoskeletal: Normal range of motion.  Neurological: She is alert and oriented to person, place, and time.  Positive tremor  Skin: Skin is warm and dry.  Patient has 2 puncture wounds to the right lower extremity just distal to the knee on the medial side.  Psychiatric: She has a normal mood and affect. Her behavior is normal.  Nursing note and vitals reviewed.   ED Course  Procedures (including critical care time)  DIAGNOSTIC STUDIES: Oxygen Saturation is 96% on RA, normal by my interpretation.    COORDINATION OF CARE:    Labs Review Labs Reviewed - No data to display  Imaging Review No results found. I have personally reviewed and evaluated these images and lab results as part of my medical  decision-making.   EKG Interpretation None      MDM   Final diagnoses:  Cat bite, initial encounter   Filed Vitals:   07/25/15 1857 07/25/15 1916  BP: 150/68   Pulse: 73   Temp: 98 F (36.7 C)   TempSrc: Oral   Resp: 16   Height: 5\' 8"  (1.727 m) 5\' 8"  (1.727 m)  Weight: 140 lb (63.504 kg) 138 lb (62.596 kg)  SpO2: 96%     Medications  rabies vaccine (RABAVERT) injection 1 mL (not administered)  rabies immune globulin (HYPERAB) injection 1,275 Units (not administered)  HYDROcodone-acetaminophen (NORCO/VICODIN) 5-325 MG per tablet 1 tablet (not administered)  Tdap (BOOSTRIX) injection 0.5 mL (0.5 mLs Intramuscular Given 07/25/15 1944)  amoxicillin-clavulanate (AUGMENTIN) 875-125 MG per tablet 1 tablet (1 tablet Oral Given 07/25/15 1943)    Suzanne Leon is a pleasant 74 y.o. female presenting with stray cat bite to right lower extremity. Cat is unavailable for rabies testing. She will need rabies prophylaxis and IgG. Her tetanus is updated and Augmentin is initiated.  Evaluation does not show pathology that would require ongoing emergent intervention or inpatient treatment. Pt is hemodynamically stable and mentating appropriately. Discussed findings and plan with patient/guardian, who agrees with care plan. All questions answered. Return precautions discussed and outpatient follow up given.   New Prescriptions   AMOXICILLIN-CLAVULANATE (AUGMENTIN) 875-125 MG PER TABLET    Take 1 tablet by mouth 2 (two) times daily. One po bid x 7 days   I personally performed the services described in this documentation, which was scribed in my presence. The recorded information has been reviewed and is accurate.    Monico Blitz, PA-C 07/25/15 2008  Evelina Bucy, MD 07/26/15 Berniece Salines

## 2015-07-25 NOTE — ED Notes (Signed)
A stray cat bit her on her R inner calf today. She was unable to catch the cat and does not know the cat. Small puncture wounds with no active bleeding noted. Denies pain.

## 2015-07-28 ENCOUNTER — Encounter (HOSPITAL_COMMUNITY): Payer: Self-pay

## 2015-07-28 ENCOUNTER — Emergency Department (INDEPENDENT_AMBULATORY_CARE_PROVIDER_SITE_OTHER)
Admission: EM | Admit: 2015-07-28 | Discharge: 2015-07-28 | Disposition: A | Discharge status: Home / Self Care | Payer: Medicare Other | Source: Home / Self Care

## 2015-07-28 DIAGNOSIS — Z23 Encounter for immunization: Secondary | ICD-10-CM

## 2015-07-28 DIAGNOSIS — Z203 Contact with and (suspected) exposure to rabies: Secondary | ICD-10-CM

## 2015-07-28 MED ORDER — RABIES VACCINE, PCEC IM SUSR
INTRAMUSCULAR | Status: AC
  Filled 2015-07-28: qty 1

## 2015-07-28 MED ORDER — RABIES VACCINE, PCEC IM SUSR
1.0000 mL | Freq: Once | INTRAMUSCULAR | Status: AC
  Administered 2015-07-28: 1 mL via INTRAMUSCULAR

## 2015-07-28 NOTE — ED Notes (Addendum)
Here for shot #2, schedule day #3 of series . Denies pain, denies problems

## 2015-08-01 ENCOUNTER — Encounter (HOSPITAL_COMMUNITY): Payer: Self-pay

## 2015-08-01 ENCOUNTER — Emergency Department (INDEPENDENT_AMBULATORY_CARE_PROVIDER_SITE_OTHER)
Admission: EM | Admit: 2015-08-01 | Discharge: 2015-08-01 | Disposition: A | Discharge status: Home / Self Care | Payer: Medicare Other | Source: Home / Self Care

## 2015-08-01 DIAGNOSIS — Z203 Contact with and (suspected) exposure to rabies: Secondary | ICD-10-CM

## 2015-08-01 DIAGNOSIS — S81851D Open bite, right lower leg, subsequent encounter: Secondary | ICD-10-CM | POA: Diagnosis not present

## 2015-08-01 DIAGNOSIS — Z23 Encounter for immunization: Secondary | ICD-10-CM

## 2015-08-01 MED ORDER — RABIES VACCINE, PCEC IM SUSR
1.0000 mL | Freq: Once | INTRAMUSCULAR | Status: AC
  Administered 2015-08-01: 1 mL via INTRAMUSCULAR

## 2015-08-01 MED ORDER — RABIES VACCINE, PCEC IM SUSR
INTRAMUSCULAR | Status: AC
  Filled 2015-08-01: qty 1

## 2015-08-01 NOTE — ED Notes (Signed)
Here for shot #3, day #7 of rabies series . Denies problems

## 2015-08-08 ENCOUNTER — Emergency Department (INDEPENDENT_AMBULATORY_CARE_PROVIDER_SITE_OTHER)
Admission: EM | Admit: 2015-08-08 | Discharge: 2015-08-08 | Disposition: A | Discharge status: Home / Self Care | Payer: Medicare Other | Source: Home / Self Care

## 2015-08-08 ENCOUNTER — Encounter (HOSPITAL_COMMUNITY): Payer: Self-pay | Admitting: *Deleted

## 2015-08-08 DIAGNOSIS — Z203 Contact with and (suspected) exposure to rabies: Secondary | ICD-10-CM | POA: Diagnosis not present

## 2015-08-08 DIAGNOSIS — Z23 Encounter for immunization: Secondary | ICD-10-CM | POA: Diagnosis not present

## 2015-08-08 MED ORDER — RABIES VACCINE, PCEC IM SUSR
INTRAMUSCULAR | Status: AC
  Filled 2015-08-08: qty 1

## 2015-08-08 MED ORDER — RABIES VACCINE, PCEC IM SUSR
1.0000 mL | Freq: Once | INTRAMUSCULAR | Status: AC
  Administered 2015-08-08: 1 mL via INTRAMUSCULAR

## 2015-08-08 NOTE — ED Notes (Signed)
Pt  Here  For  Next  In  Series   Of     Injections    Verbalizes  No     Complaints

## 2015-08-08 NOTE — Discharge Instructions (Signed)
congrats    You  Are  Finished   Return  As  Needed

## 2015-08-28 DIAGNOSIS — E785 Hyperlipidemia, unspecified: Secondary | ICD-10-CM | POA: Diagnosis not present

## 2015-08-28 DIAGNOSIS — E039 Hypothyroidism, unspecified: Secondary | ICD-10-CM | POA: Diagnosis not present

## 2015-08-28 DIAGNOSIS — N390 Urinary tract infection, site not specified: Secondary | ICD-10-CM | POA: Diagnosis not present

## 2015-08-28 DIAGNOSIS — M810 Age-related osteoporosis without current pathological fracture: Secondary | ICD-10-CM | POA: Diagnosis not present

## 2015-08-28 DIAGNOSIS — R8290 Unspecified abnormal findings in urine: Secondary | ICD-10-CM | POA: Diagnosis not present

## 2015-09-04 DIAGNOSIS — E785 Hyperlipidemia, unspecified: Secondary | ICD-10-CM | POA: Diagnosis not present

## 2015-09-04 DIAGNOSIS — M79641 Pain in right hand: Secondary | ICD-10-CM | POA: Diagnosis not present

## 2015-09-04 DIAGNOSIS — Z1389 Encounter for screening for other disorder: Secondary | ICD-10-CM | POA: Diagnosis not present

## 2015-09-04 DIAGNOSIS — M545 Low back pain: Secondary | ICD-10-CM | POA: Diagnosis not present

## 2015-09-04 DIAGNOSIS — Z0000 Encounter for general adult medical examination without abnormal findings: Secondary | ICD-10-CM | POA: Diagnosis not present

## 2015-09-04 DIAGNOSIS — R1312 Dysphagia, oropharyngeal phase: Secondary | ICD-10-CM | POA: Diagnosis not present

## 2015-09-04 DIAGNOSIS — M810 Age-related osteoporosis without current pathological fracture: Secondary | ICD-10-CM | POA: Diagnosis not present

## 2015-09-04 DIAGNOSIS — I10 Essential (primary) hypertension: Secondary | ICD-10-CM | POA: Diagnosis not present

## 2015-09-04 DIAGNOSIS — Z6821 Body mass index (BMI) 21.0-21.9, adult: Secondary | ICD-10-CM | POA: Diagnosis not present

## 2015-09-04 DIAGNOSIS — E039 Hypothyroidism, unspecified: Secondary | ICD-10-CM | POA: Diagnosis not present

## 2015-09-04 DIAGNOSIS — G4700 Insomnia, unspecified: Secondary | ICD-10-CM | POA: Diagnosis not present

## 2015-09-16 DIAGNOSIS — R400 Somnolence: Secondary | ICD-10-CM | POA: Diagnosis not present

## 2015-09-16 DIAGNOSIS — Z1212 Encounter for screening for malignant neoplasm of rectum: Secondary | ICD-10-CM | POA: Diagnosis not present

## 2015-09-17 DIAGNOSIS — R400 Somnolence: Secondary | ICD-10-CM | POA: Diagnosis not present

## 2015-10-16 ENCOUNTER — Encounter: Payer: Self-pay | Admitting: Internal Medicine

## 2016-01-01 ENCOUNTER — Other Ambulatory Visit (HOSPITAL_COMMUNITY): Payer: Self-pay | Admitting: Internal Medicine

## 2016-01-01 DIAGNOSIS — Z1231 Encounter for screening mammogram for malignant neoplasm of breast: Secondary | ICD-10-CM

## 2016-01-09 ENCOUNTER — Ambulatory Visit (HOSPITAL_COMMUNITY)
Admission: RE | Admit: 2016-01-09 | Discharge: 2016-01-09 | Disposition: A | Discharge status: Home / Self Care | Payer: Medicare Other | Source: Ambulatory Visit | Attending: Internal Medicine | Admitting: Internal Medicine

## 2016-01-09 DIAGNOSIS — Z1231 Encounter for screening mammogram for malignant neoplasm of breast: Secondary | ICD-10-CM | POA: Insufficient documentation

## 2016-01-13 ENCOUNTER — Other Ambulatory Visit: Payer: Self-pay | Admitting: Internal Medicine

## 2016-01-13 DIAGNOSIS — R928 Other abnormal and inconclusive findings on diagnostic imaging of breast: Secondary | ICD-10-CM

## 2016-01-20 ENCOUNTER — Other Ambulatory Visit: Payer: Self-pay | Admitting: Internal Medicine

## 2016-01-20 DIAGNOSIS — IMO0002 Reserved for concepts with insufficient information to code with codable children: Secondary | ICD-10-CM

## 2016-01-20 DIAGNOSIS — R229 Localized swelling, mass and lump, unspecified: Secondary | ICD-10-CM

## 2016-01-20 DIAGNOSIS — R928 Other abnormal and inconclusive findings on diagnostic imaging of breast: Secondary | ICD-10-CM

## 2016-01-22 ENCOUNTER — Ambulatory Visit (HOSPITAL_COMMUNITY)
Admission: RE | Admit: 2016-01-22 | Discharge: 2016-01-22 | Disposition: A | Discharge status: Home / Self Care | Payer: Medicare Other | Source: Ambulatory Visit | Attending: Internal Medicine | Admitting: Internal Medicine

## 2016-01-22 DIAGNOSIS — IMO0002 Reserved for concepts with insufficient information to code with codable children: Secondary | ICD-10-CM

## 2016-01-22 DIAGNOSIS — R928 Other abnormal and inconclusive findings on diagnostic imaging of breast: Secondary | ICD-10-CM

## 2016-01-22 DIAGNOSIS — N6001 Solitary cyst of right breast: Secondary | ICD-10-CM | POA: Diagnosis not present

## 2016-01-22 DIAGNOSIS — N63 Unspecified lump in breast: Secondary | ICD-10-CM | POA: Diagnosis not present

## 2016-01-22 DIAGNOSIS — N6011 Diffuse cystic mastopathy of right breast: Secondary | ICD-10-CM | POA: Diagnosis not present

## 2016-01-22 DIAGNOSIS — R229 Localized swelling, mass and lump, unspecified: Secondary | ICD-10-CM

## 2016-01-27 ENCOUNTER — Encounter (HOSPITAL_COMMUNITY): Payer: Medicare Other

## 2016-05-31 DIAGNOSIS — R0609 Other forms of dyspnea: Secondary | ICD-10-CM | POA: Diagnosis not present

## 2016-05-31 DIAGNOSIS — E038 Other specified hypothyroidism: Secondary | ICD-10-CM | POA: Diagnosis not present

## 2016-05-31 DIAGNOSIS — Z6820 Body mass index (BMI) 20.0-20.9, adult: Secondary | ICD-10-CM | POA: Diagnosis not present

## 2016-05-31 DIAGNOSIS — G4709 Other insomnia: Secondary | ICD-10-CM | POA: Diagnosis not present

## 2016-05-31 DIAGNOSIS — I10 Essential (primary) hypertension: Secondary | ICD-10-CM | POA: Diagnosis not present

## 2016-06-17 DIAGNOSIS — E7800 Pure hypercholesterolemia, unspecified: Secondary | ICD-10-CM | POA: Diagnosis not present

## 2016-06-17 DIAGNOSIS — R001 Bradycardia, unspecified: Secondary | ICD-10-CM | POA: Diagnosis not present

## 2016-06-17 DIAGNOSIS — R9431 Abnormal electrocardiogram [ECG] [EKG]: Secondary | ICD-10-CM | POA: Diagnosis not present

## 2016-06-17 DIAGNOSIS — R0609 Other forms of dyspnea: Secondary | ICD-10-CM | POA: Diagnosis not present

## 2016-06-28 DIAGNOSIS — R0602 Shortness of breath: Secondary | ICD-10-CM | POA: Diagnosis not present

## 2016-06-28 DIAGNOSIS — R9431 Abnormal electrocardiogram [ECG] [EKG]: Secondary | ICD-10-CM | POA: Diagnosis not present

## 2016-06-29 DIAGNOSIS — R9431 Abnormal electrocardiogram [ECG] [EKG]: Secondary | ICD-10-CM | POA: Diagnosis not present

## 2016-06-29 DIAGNOSIS — R0602 Shortness of breath: Secondary | ICD-10-CM | POA: Diagnosis not present

## 2016-07-07 DIAGNOSIS — R9431 Abnormal electrocardiogram [ECG] [EKG]: Secondary | ICD-10-CM | POA: Diagnosis not present

## 2016-07-07 DIAGNOSIS — R0609 Other forms of dyspnea: Secondary | ICD-10-CM | POA: Diagnosis not present

## 2016-07-07 DIAGNOSIS — E7800 Pure hypercholesterolemia, unspecified: Secondary | ICD-10-CM | POA: Diagnosis not present

## 2016-07-07 DIAGNOSIS — R001 Bradycardia, unspecified: Secondary | ICD-10-CM | POA: Diagnosis not present

## 2016-09-02 DIAGNOSIS — N390 Urinary tract infection, site not specified: Secondary | ICD-10-CM | POA: Diagnosis not present

## 2016-09-02 DIAGNOSIS — M810 Age-related osteoporosis without current pathological fracture: Secondary | ICD-10-CM | POA: Diagnosis not present

## 2016-09-02 DIAGNOSIS — E038 Other specified hypothyroidism: Secondary | ICD-10-CM | POA: Diagnosis not present

## 2016-09-02 DIAGNOSIS — R8299 Other abnormal findings in urine: Secondary | ICD-10-CM | POA: Diagnosis not present

## 2016-09-02 DIAGNOSIS — I10 Essential (primary) hypertension: Secondary | ICD-10-CM | POA: Diagnosis not present

## 2016-09-02 DIAGNOSIS — E784 Other hyperlipidemia: Secondary | ICD-10-CM | POA: Diagnosis not present

## 2016-09-09 DIAGNOSIS — Z23 Encounter for immunization: Secondary | ICD-10-CM | POA: Diagnosis not present

## 2016-09-09 DIAGNOSIS — F329 Major depressive disorder, single episode, unspecified: Secondary | ICD-10-CM | POA: Diagnosis not present

## 2016-09-09 DIAGNOSIS — Z6820 Body mass index (BMI) 20.0-20.9, adult: Secondary | ICD-10-CM | POA: Diagnosis not present

## 2016-09-09 DIAGNOSIS — E784 Other hyperlipidemia: Secondary | ICD-10-CM | POA: Diagnosis not present

## 2016-09-09 DIAGNOSIS — I10 Essential (primary) hypertension: Secondary | ICD-10-CM | POA: Diagnosis not present

## 2016-09-09 DIAGNOSIS — Z0000 Encounter for general adult medical examination without abnormal findings: Secondary | ICD-10-CM | POA: Diagnosis not present

## 2016-09-09 DIAGNOSIS — E038 Other specified hypothyroidism: Secondary | ICD-10-CM | POA: Diagnosis not present

## 2016-09-09 DIAGNOSIS — Z1389 Encounter for screening for other disorder: Secondary | ICD-10-CM | POA: Diagnosis not present

## 2016-09-09 DIAGNOSIS — M810 Age-related osteoporosis without current pathological fracture: Secondary | ICD-10-CM | POA: Diagnosis not present

## 2016-09-09 DIAGNOSIS — F418 Other specified anxiety disorders: Secondary | ICD-10-CM | POA: Diagnosis not present

## 2016-10-27 ENCOUNTER — Encounter: Payer: Self-pay | Admitting: Endocrinology

## 2016-10-27 ENCOUNTER — Ambulatory Visit (INDEPENDENT_AMBULATORY_CARE_PROVIDER_SITE_OTHER): Payer: Medicare Other | Admitting: Endocrinology

## 2016-10-27 VITALS — BP 134/70 | HR 79 | Ht 67.0 in | Wt 132.0 lb

## 2016-10-27 DIAGNOSIS — R5383 Other fatigue: Secondary | ICD-10-CM

## 2016-10-27 DIAGNOSIS — L630 Alopecia (capitis) totalis: Secondary | ICD-10-CM

## 2016-10-27 DIAGNOSIS — G4700 Insomnia, unspecified: Secondary | ICD-10-CM | POA: Diagnosis not present

## 2016-10-27 DIAGNOSIS — G250 Essential tremor: Secondary | ICD-10-CM | POA: Insufficient documentation

## 2016-10-27 HISTORY — DX: Alopecia (capitis) totalis: L63.0

## 2016-10-27 NOTE — Progress Notes (Signed)
Patient ID: Suzanne Leon, female   DOB: 1941/09/11, 75 y.o.   MRN: [id number]             Reason for Appointment:  ? Hypothyroidism, new visit    History of Present Illness:   Hypothyroidism was first diagnosed in 76s by her gynecologist  The patient is a poor historian and does not remember what symptoms she was having at that time She was followed by her gynecologist and periodically had adjustments of her dose. However since 1980s the patient has not taken any thyroid supplements that she was given because of being involved with caring for her family members and did not follow-up also  For quite some time the patient says that she has been having problems of feeling tired especially on waking up in the morning, she has had cold intolerance for several years.  Also thinks that she has had progressive hair loss over the last several years and now is wearing a week         The patient has been evaluated by her primary care physicians over the last several years and has been told that her thyroid levels have been normal However she wants a second opinion now since she thinks that her symptoms are related to the thyroid and that her blood tests are not showing her problem She has not had any significant weight change over the last few years         Patient's weight history is as follows:  Wt Readings from Last 3 Encounters:  10/27/16 132 lb (59.9 kg)  07/25/15 138 lb (62.6 kg)  11/14/12 135 lb (61.2 kg)    Thyroid function results have been as follows:  09/02/16: TSH was 2.2 and free T4 1 0.1  No results found for: FREET4, TSH   Past Medical History:  Diagnosis Date  . Anxiety   . Arthritis   . Chronic headaches   . Depression   . GERD (gastroesophageal reflux disease)   . HLD (hyperlipidemia)   . Internal hemorrhoids   . Osteoporosis   . Skin cancer     Past Surgical History:  Procedure Laterality Date  . ABDOMINAL HYSTERECTOMY    . APPENDECTOMY    .  COLONOSCOPY    . ESOPHAGEAL MANOMETRY  12/18/2012   Procedure: ESOPHAGEAL MANOMETRY (EM);  Surgeon: Lafayette Dragon, MD;  Location: [facility];  Service: Endoscopy;  Laterality: N/A;  . LAMINECTOMY    . TONSILLECTOMY      Family History  Problem Relation Age of Onset  . Breast cancer Daughter   . Thyroid disease Daughter   . Ulcers Father     peptic  . Throat cancer Mother   . Diabetes Neg Hx     Social History:  reports that she has quit smoking. Her smoking use included Cigarettes. She has never used smokeless tobacco. She reports that she does not drink alcohol or use drugs.  Allergies: No Known Allergies    Medication List       Accurate as of 10/27/16 12:36 PM. Always use your most recent med list.          ALPRAZolam 0.25 MG tablet Commonly known as:  XANAX Take 0.25 mg by mouth at bedtime as needed.   propranolol 10 MG tablet Commonly known as:  INDERAL Take 10 mg by mouth as needed.       Review of Systems:  Review of Systems  Constitutional: Negative for weight loss, weight gain and  reduced appetite.  HENT: Positive for trouble swallowing.        Dry mouth and throat Has trouble swallowing dry foods but otherwise no problem  Respiratory: Positive for shortness of breath.        She feels sometimes has difficulty taking of breath and something slowing her breathing in the windpipe  Cardiovascular:       She had negative evaluation from cardiologist, no cause of shortness of breath found  Gastrointestinal: Positive for constipation. Negative for nausea.       Better with fiber  Endocrine: Positive for fatigue and cold intolerance.       Tired mostly in ams   Musculoskeletal: Positive for joint pain.       Has mild joint pain of elbows or fingers but no swelling  Skin:       Hair loss: She has had progressive hair loss over the last several years including body hair and eyebrows  Neurological: Positive for tremors.       Has shakiness of her head but  not extremities, treated with Inderal, has not seen neurologist  Psychiatric/Behavioral: Positive for depressed mood, nervousness and insomnia.                Examination:    BP 134/70   Pulse 79   Ht 5\' 7"  (1.702 m)   Wt 132 lb (59.9 kg)   BMI 20.67 kg/m   GENERAL:  Average build, mildly asthenic.   No pallor, clubbing, lymphadenopathy or edema.   Skin:  no rash or pigmentation. She has diffuse thinning of her scalp. Has decreased body hair and only minimal hair medially on the eyebrows   EYES:  No prominence of the eyes or swelling of the eyelids  ENT: Oral mucosa and tongue normal.  No oral pigmentation  THYROID:  Not palpable.  HEART:  Normal  S1 and S2; no murmur or click.  CHEST:    Lungs: Vescicular breath sounds heard equally.  No crepitations/ wheeze.  ABDOMEN:  No distention.  Liver and spleen not palpable.  No other mass or tenderness.  NEUROLOGICAL: Reflexes are bilaterally normal at biceps, unable to elicit at ankles.  JOINTS:  Normal.  Spine appears normal to inspection   Assessment:  FATIGUE: This is likely to be related to her insomnia and some element of anxiety and depression  She does not have hypothyroidism No evidence of thyroid enlargement Explained to her that her TSH and free T4 are both normal ruling out hypothyroidism and even with taking thyroid hormone supplements it will not help her feel better and would be even harmful.  Does not appear to have any other systemic disease  ALOPECIA totalis: This is likely autoimmune and explained this to her.  Needs to work with a dermatologist for treatment  PLAN:   She will need to follow-up with her PCP for management of general medical problems, insomnia and depression   Jalysa Swopes 10/27/2016, 12:36 PM

## 2017-06-23 ENCOUNTER — Other Ambulatory Visit: Payer: Self-pay | Admitting: Internal Medicine

## 2017-06-23 DIAGNOSIS — R1310 Dysphagia, unspecified: Secondary | ICD-10-CM

## 2017-06-23 DIAGNOSIS — R5383 Other fatigue: Secondary | ICD-10-CM | POA: Diagnosis not present

## 2017-06-23 DIAGNOSIS — E038 Other specified hypothyroidism: Secondary | ICD-10-CM | POA: Diagnosis not present

## 2017-06-23 DIAGNOSIS — L988 Other specified disorders of the skin and subcutaneous tissue: Secondary | ICD-10-CM | POA: Diagnosis not present

## 2017-06-23 DIAGNOSIS — R1312 Dysphagia, oropharyngeal phase: Secondary | ICD-10-CM | POA: Diagnosis not present

## 2017-06-23 DIAGNOSIS — I10 Essential (primary) hypertension: Secondary | ICD-10-CM | POA: Diagnosis not present

## 2017-06-23 DIAGNOSIS — Z1389 Encounter for screening for other disorder: Secondary | ICD-10-CM | POA: Diagnosis not present

## 2017-06-23 DIAGNOSIS — F3289 Other specified depressive episodes: Secondary | ICD-10-CM | POA: Diagnosis not present

## 2017-06-23 DIAGNOSIS — Z6820 Body mass index (BMI) 20.0-20.9, adult: Secondary | ICD-10-CM | POA: Diagnosis not present

## 2017-06-27 ENCOUNTER — Other Ambulatory Visit: Payer: Medicare Other

## 2017-06-29 ENCOUNTER — Ambulatory Visit
Admission: RE | Admit: 2017-06-29 | Discharge: 2017-06-29 | Disposition: A | Discharge status: Home / Self Care | Payer: Medicare Other | Source: Ambulatory Visit | Attending: Internal Medicine | Admitting: Internal Medicine

## 2017-06-29 DIAGNOSIS — R1310 Dysphagia, unspecified: Secondary | ICD-10-CM | POA: Diagnosis not present

## 2017-07-22 DIAGNOSIS — J029 Acute pharyngitis, unspecified: Secondary | ICD-10-CM | POA: Diagnosis not present

## 2017-07-22 DIAGNOSIS — J3489 Other specified disorders of nose and nasal sinuses: Secondary | ICD-10-CM

## 2017-07-22 HISTORY — DX: Other specified disorders of nose and nasal sinuses: J34.89

## 2017-09-06 DIAGNOSIS — I10 Essential (primary) hypertension: Secondary | ICD-10-CM | POA: Diagnosis not present

## 2017-09-06 DIAGNOSIS — M810 Age-related osteoporosis without current pathological fracture: Secondary | ICD-10-CM | POA: Diagnosis not present

## 2017-09-06 DIAGNOSIS — E7849 Other hyperlipidemia: Secondary | ICD-10-CM | POA: Diagnosis not present

## 2017-09-06 DIAGNOSIS — E038 Other specified hypothyroidism: Secondary | ICD-10-CM | POA: Diagnosis not present

## 2017-09-06 DIAGNOSIS — Z0000 Encounter for general adult medical examination without abnormal findings: Secondary | ICD-10-CM | POA: Diagnosis not present

## 2017-09-13 DIAGNOSIS — L988 Other specified disorders of the skin and subcutaneous tissue: Secondary | ICD-10-CM | POA: Diagnosis not present

## 2017-09-13 DIAGNOSIS — Z23 Encounter for immunization: Secondary | ICD-10-CM | POA: Diagnosis not present

## 2017-09-13 DIAGNOSIS — I10 Essential (primary) hypertension: Secondary | ICD-10-CM | POA: Diagnosis not present

## 2017-09-13 DIAGNOSIS — E7849 Other hyperlipidemia: Secondary | ICD-10-CM | POA: Diagnosis not present

## 2017-09-13 DIAGNOSIS — Z0000 Encounter for general adult medical examination without abnormal findings: Secondary | ICD-10-CM | POA: Diagnosis not present

## 2017-09-13 DIAGNOSIS — R1312 Dysphagia, oropharyngeal phase: Secondary | ICD-10-CM | POA: Diagnosis not present

## 2017-09-13 DIAGNOSIS — Z6820 Body mass index (BMI) 20.0-20.9, adult: Secondary | ICD-10-CM | POA: Diagnosis not present

## 2017-09-13 DIAGNOSIS — M810 Age-related osteoporosis without current pathological fracture: Secondary | ICD-10-CM | POA: Diagnosis not present

## 2017-09-13 DIAGNOSIS — E038 Other specified hypothyroidism: Secondary | ICD-10-CM | POA: Diagnosis not present

## 2017-09-21 DIAGNOSIS — Z1212 Encounter for screening for malignant neoplasm of rectum: Secondary | ICD-10-CM | POA: Diagnosis not present

## 2017-10-05 DIAGNOSIS — B078 Other viral warts: Secondary | ICD-10-CM | POA: Diagnosis not present

## 2017-10-07 DIAGNOSIS — Z681 Body mass index (BMI) 19 or less, adult: Secondary | ICD-10-CM | POA: Diagnosis not present

## 2017-10-07 DIAGNOSIS — R008 Other abnormalities of heart beat: Secondary | ICD-10-CM | POA: Diagnosis not present

## 2017-10-07 DIAGNOSIS — R002 Palpitations: Secondary | ICD-10-CM | POA: Diagnosis not present

## 2017-12-15 DIAGNOSIS — D140 Benign neoplasm of middle ear, nasal cavity and accessory sinuses: Secondary | ICD-10-CM | POA: Diagnosis not present

## 2017-12-15 DIAGNOSIS — L739 Follicular disorder, unspecified: Secondary | ICD-10-CM | POA: Diagnosis not present

## 2018-06-30 DIAGNOSIS — J029 Acute pharyngitis, unspecified: Secondary | ICD-10-CM | POA: Diagnosis not present

## 2018-06-30 DIAGNOSIS — Z681 Body mass index (BMI) 19 or less, adult: Secondary | ICD-10-CM | POA: Diagnosis not present

## 2018-06-30 DIAGNOSIS — K219 Gastro-esophageal reflux disease without esophagitis: Secondary | ICD-10-CM | POA: Diagnosis not present

## 2018-09-26 DIAGNOSIS — M810 Age-related osteoporosis without current pathological fracture: Secondary | ICD-10-CM | POA: Diagnosis not present

## 2018-09-26 DIAGNOSIS — E7849 Other hyperlipidemia: Secondary | ICD-10-CM | POA: Diagnosis not present

## 2018-09-26 DIAGNOSIS — I10 Essential (primary) hypertension: Secondary | ICD-10-CM | POA: Diagnosis not present

## 2018-09-26 DIAGNOSIS — R82998 Other abnormal findings in urine: Secondary | ICD-10-CM | POA: Diagnosis not present

## 2018-09-26 DIAGNOSIS — E038 Other specified hypothyroidism: Secondary | ICD-10-CM | POA: Diagnosis not present

## 2018-09-26 LAB — LIPID PANEL
CHOLESTEROL: 250 — AB (ref 0–200)
HDL: 79 — AB (ref 35–70)
LDL Cholesterol: 158
LDl/HDL Ratio: 2
TRIGLYCERIDES: 66 (ref 40–160)

## 2018-09-26 LAB — HEPATIC FUNCTION PANEL
ALK PHOS: 72 (ref 25–125)
ALT: 16 (ref 7–35)
AST: 22 (ref 13–35)
Bilirubin, Total: 1

## 2018-09-26 LAB — CBC AND DIFFERENTIAL
HCT: 38 (ref 36–46)
HEMOGLOBIN: 11.9 — AB (ref 12.0–16.0)
Neutrophils Absolute: 2
WBC: 4

## 2018-09-26 LAB — BASIC METABOLIC PANEL
BUN: 17 (ref 4–21)
Creatinine: 0.9 (ref 0.5–1.1)
Glucose: 93
Potassium: 4.1 (ref 3.4–5.3)
Sodium: 143 (ref 137–147)

## 2018-09-26 LAB — TSH: TSH: 1.18 (ref 0.41–5.90)

## 2018-09-26 LAB — MICROALBUMIN, URINE: Microalb, Ur: 53.2

## 2018-09-26 LAB — VITAMIN D 25 HYDROXY (VIT D DEFICIENCY, FRACTURES): VIT D 25 HYDROXY: 27.2

## 2018-10-03 DIAGNOSIS — I10 Essential (primary) hypertension: Secondary | ICD-10-CM | POA: Diagnosis not present

## 2018-10-03 DIAGNOSIS — M545 Low back pain: Secondary | ICD-10-CM | POA: Diagnosis not present

## 2018-10-03 DIAGNOSIS — F3289 Other specified depressive episodes: Secondary | ICD-10-CM | POA: Diagnosis not present

## 2018-10-03 DIAGNOSIS — K5909 Other constipation: Secondary | ICD-10-CM | POA: Diagnosis not present

## 2018-10-03 DIAGNOSIS — Z681 Body mass index (BMI) 19 or less, adult: Secondary | ICD-10-CM | POA: Diagnosis not present

## 2018-10-03 DIAGNOSIS — R1319 Other dysphagia: Secondary | ICD-10-CM | POA: Diagnosis not present

## 2018-10-03 DIAGNOSIS — M810 Age-related osteoporosis without current pathological fracture: Secondary | ICD-10-CM | POA: Diagnosis not present

## 2018-10-03 DIAGNOSIS — K219 Gastro-esophageal reflux disease without esophagitis: Secondary | ICD-10-CM | POA: Diagnosis not present

## 2018-10-03 DIAGNOSIS — Z1389 Encounter for screening for other disorder: Secondary | ICD-10-CM | POA: Diagnosis not present

## 2018-10-03 DIAGNOSIS — G4709 Other insomnia: Secondary | ICD-10-CM | POA: Diagnosis not present

## 2018-10-03 DIAGNOSIS — Z0000 Encounter for general adult medical examination without abnormal findings: Secondary | ICD-10-CM | POA: Diagnosis not present

## 2018-10-10 ENCOUNTER — Other Ambulatory Visit (HOSPITAL_COMMUNITY): Payer: Self-pay | Admitting: Internal Medicine

## 2018-10-10 DIAGNOSIS — Z1212 Encounter for screening for malignant neoplasm of rectum: Secondary | ICD-10-CM | POA: Diagnosis not present

## 2018-10-10 DIAGNOSIS — R1310 Dysphagia, unspecified: Secondary | ICD-10-CM

## 2018-10-19 ENCOUNTER — Encounter: Payer: Self-pay | Admitting: Family Medicine

## 2018-10-19 ENCOUNTER — Ambulatory Visit (INDEPENDENT_AMBULATORY_CARE_PROVIDER_SITE_OTHER): Payer: Medicare Other | Admitting: Family Medicine

## 2018-10-19 VITALS — BP 150/82 | HR 61 | Temp 97.8°F | Resp 12 | Ht 67.0 in | Wt 120.0 lb

## 2018-10-19 DIAGNOSIS — R1319 Other dysphagia: Secondary | ICD-10-CM

## 2018-10-19 DIAGNOSIS — G250 Essential tremor: Secondary | ICD-10-CM

## 2018-10-19 DIAGNOSIS — R030 Elevated blood-pressure reading, without diagnosis of hypertension: Secondary | ICD-10-CM | POA: Diagnosis not present

## 2018-10-19 DIAGNOSIS — R1310 Dysphagia, unspecified: Secondary | ICD-10-CM

## 2018-10-19 DIAGNOSIS — R634 Abnormal weight loss: Secondary | ICD-10-CM | POA: Insufficient documentation

## 2018-10-19 DIAGNOSIS — I10 Essential (primary) hypertension: Secondary | ICD-10-CM | POA: Insufficient documentation

## 2018-10-19 DIAGNOSIS — R0989 Other specified symptoms and signs involving the circulatory and respiratory systems: Secondary | ICD-10-CM

## 2018-10-19 DIAGNOSIS — R09A2 Foreign body sensation, throat: Secondary | ICD-10-CM | POA: Insufficient documentation

## 2018-10-19 DIAGNOSIS — R198 Other specified symptoms and signs involving the digestive system and abdomen: Secondary | ICD-10-CM | POA: Insufficient documentation

## 2018-10-19 HISTORY — DX: Abnormal weight loss: R63.4

## 2018-10-19 NOTE — Progress Notes (Signed)
Subjective:    Patient ID: Suzanne Leon, female    DOB: 1941-07-04, 77 y.o.   MRN: [id number]  Chief Complaint  Patient presents with  . Establish Care  . Choking    Sensation of food been stock in her throat. x 2 years.  . Thyroid Problem  . Hypertension    HPI  #Thyroid issues - originally diagnosed in 1973 - hypothyroid and taking replacement medication - stopped in the 1980s - endorses trembling, throat issues - stays freezing cold "all the time" - difficulty concentrating - symptoms have been an issue for 30 years -- has "been having trouble" - endorses 20 lbs wt loss w/o change in eating habits - eating a lot  Wt Readings from Last 3 Encounters:  10/19/18 120 lb 0.8 oz (54.5 kg)  10/27/16 132 lb (59.9 kg)  07/25/15 138 lb (62.6 kg)     #Choking - sensation of something in her throat - endorses mild heartburn - difficulty swallowing - food not wanting to go down - things keep coming back  - tried - some kind of medication about 6 months ago - Thinks it was a primary care office - cannot eat sold food - even pound cake - does OK with liquids but sometimes with difficulty - normal esophageal motility in 2018 - x2 years or more  No hx of abnormal cancer screening  HTN BP Readings from Last 3 Encounters:  10/19/18 (!) 158/84  10/27/16 134/70  08/01/15 146/69   - has had high BP in doctor offices - endorses fatigue and SOB - no hx of being on medication for BP   Review of Systems  Constitutional: Negative for chills, diaphoresis and fever.  HENT: Positive for congestion, postnasal drip, rhinorrhea and sinus pain. Negative for sinus pressure.   Respiratory: Negative for cough.   Cardiovascular: Negative for chest pain.  Gastrointestinal: Negative for abdominal pain, blood in stool, constipation, diarrhea, nausea and vomiting.  Endocrine: Positive for cold intolerance and polydipsia (dry mouth). Negative for heat intolerance.  Genitourinary:  Negative.        Objective:   Physical Exam  Constitutional:  Thin, well appearing  HENT:  Head: Normocephalic and atraumatic.  Right Ear: Ear canal normal. A middle ear effusion is present.  Left Ear: Tympanic membrane and ear canal normal.  Eyes: Pupils are equal, round, and reactive to light. Conjunctivae are normal.  Neck: Normal range of motion. Neck supple. No tracheal deviation present. No thyromegaly present.  Cardiovascular: Normal rate and regular rhythm.  No murmur heard. Pulmonary/Chest: Effort normal and breath sounds normal.  Abdominal: Soft. Bowel sounds are normal. She exhibits no distension and no mass. There is no tenderness. There is no rebound and no guarding.  Lymphadenopathy:    She has no cervical adenopathy.  Neurological: She is alert.  Skin: Skin is warm and dry.  Psychiatric: She has a normal mood and affect.    BP (!) 158/84 (BP Location: Left Arm, Patient Position: Sitting, Cuff Size: Normal)   Pulse 61   Temp 97.8 F (36.6 C)   Resp 12   Ht 5\' 7"  (1.702 m)   Wt 120 lb 0.8 oz (54.5 kg)   BMI 18.80 kg/m   Repeat BP 150/82     Assessment & Plan:  Suzanne Leon is a 77 yo female who lives independently and presents with several complaints that have been ongoing for years.   Essential tremor Takes Propranolol. Improves symptoms. Stable  Globus sensation  Present for >2 years (hx not clear). Normal ESOPHOGRAM / BARIUM SWALLOW / BARIUM TABLET STUDY in 2018. Endorses new 15 lb weight loss with regular diet changes. Cannot remember what medication she tried previously w/o help - refer to GI for endoscopy - SLP if normal - obtain prior records  Essential hypertension Unclear if whitecoat or essential at this time, though suspect essential. Home BP monitoring - return in 1 month to re-evaluate  Weight loss 15 lbs over the last 2 years. Unclear etiology. Recent labs with prior PCP with normal work-up including TSH. Non-smoker.  - obtain  outside records - GI referral for globus sensation  Lesleigh Noe

## 2018-10-19 NOTE — Assessment & Plan Note (Signed)
Present for >2 years (hx not clear). Normal ESOPHOGRAM / BARIUM SWALLOW / BARIUM TABLET STUDY in 2018. Endorses new 15 lb weight loss with regular diet changes. Cannot remember what medication she tried previously w/o help - refer to GI for endoscopy - SLP if normal - obtain prior records

## 2018-10-19 NOTE — Assessment & Plan Note (Signed)
Takes Propranolol. Improves symptoms. Stable

## 2018-10-19 NOTE — Patient Instructions (Signed)
Please see referral coordinator after your visit  1. Swallowing difficulty - step 1: refer to GI doctor to look down your throat - step 2: if normal - then we will refer you to speech pathology for evaluation  2. Blood Pressure - Check Blood pressure at home 3 times per week 4 hours after you wake up - Make sure you are sitting with your feet flat on the ground - write down your numbers and bring them to your next appointment - make an appointment to see me in 1 month to follow-up on throat and BP  It was great to meet you today!

## 2018-10-19 NOTE — Assessment & Plan Note (Signed)
15 lbs over the last 2 years. Unclear etiology. Recent labs with prior PCP with normal work-up including TSH. Non-smoker.  - obtain outside records - GI referral for globus sensation

## 2018-10-19 NOTE — Assessment & Plan Note (Signed)
Unclear if whitecoat or essential at this time, though suspect essential. Home BP monitoring - return in 1 month to re-evaluate

## 2018-10-20 ENCOUNTER — Telehealth: Payer: Self-pay | Admitting: Family Medicine

## 2018-10-20 ENCOUNTER — Encounter: Payer: Self-pay | Admitting: Gastroenterology

## 2018-10-20 LAB — T4, FREE: FREE T4: 1.2

## 2018-10-20 NOTE — Telephone Encounter (Signed)
Pt's son called office stating the doctor the pt seen back in October placed a referral and his mother received a letter in the mail yesterday stating she is scheduled with [facility] December 3, at 2:30 for Hearing and Speech. She was also scheduled for December 9, 3:30 with [facility] for Ear, Nose, Throat, & Neck. Please advise

## 2018-10-20 NOTE — Telephone Encounter (Signed)
OK to move forward with previous referrals. They should be helpful. Would still recommend GI referral which they can cancel if there is clarity in diagnosis from the ENT referral.   Lesleigh Noe

## 2018-10-20 NOTE — Telephone Encounter (Signed)
Scheduled patient with Dr Havery Moros with [facility] on 11/16/18 at 10:45am and patients son advised.

## 2018-10-20 NOTE — Telephone Encounter (Signed)
Called patients son and told him I would check with Dr Einar Pheasant if she wants to hold off on the GI Referral until patient sees ENT Dr at [facility] on December 9th. REferral was made by patients previous PCP and they just got these Appointments in the mail yesterday. Please advise if you want to wait for ENT consult notes before sending to GI.

## 2018-10-20 NOTE — Telephone Encounter (Signed)
Called to schedule a 4 wk f/u around 12/11. Lvm asking pt to call office

## 2018-11-13 DIAGNOSIS — G252 Other specified forms of tremor: Secondary | ICD-10-CM | POA: Diagnosis not present

## 2018-11-13 DIAGNOSIS — J383 Other diseases of vocal cords: Secondary | ICD-10-CM | POA: Diagnosis not present

## 2018-11-13 DIAGNOSIS — J3801 Paralysis of vocal cords and larynx, unilateral: Secondary | ICD-10-CM | POA: Diagnosis not present

## 2018-11-13 DIAGNOSIS — F458 Other somatoform disorders: Secondary | ICD-10-CM | POA: Diagnosis not present

## 2018-11-13 DIAGNOSIS — R1313 Dysphagia, pharyngeal phase: Secondary | ICD-10-CM | POA: Diagnosis not present

## 2018-11-15 ENCOUNTER — Ambulatory Visit: Payer: PRIVATE HEALTH INSURANCE | Admitting: Gastroenterology

## 2018-11-16 ENCOUNTER — Ambulatory Visit: Payer: PRIVATE HEALTH INSURANCE | Admitting: Gastroenterology

## 2018-12-13 DIAGNOSIS — H25013 Cortical age-related cataract, bilateral: Secondary | ICD-10-CM | POA: Diagnosis not present

## 2019-03-29 ENCOUNTER — Ambulatory Visit (INDEPENDENT_AMBULATORY_CARE_PROVIDER_SITE_OTHER)
Admission: EM | Admit: 2019-03-29 | Discharge: 2019-03-29 | Disposition: A | Discharge status: Other Acute Inpatient Hospital | Payer: Medicare Other | Source: Home / Self Care | Attending: Internal Medicine | Admitting: Internal Medicine

## 2019-03-29 ENCOUNTER — Encounter (HOSPITAL_COMMUNITY): Payer: Self-pay

## 2019-03-29 ENCOUNTER — Other Ambulatory Visit: Payer: Self-pay

## 2019-03-29 ENCOUNTER — Emergency Department (HOSPITAL_COMMUNITY)
Admission: EM | Admit: 2019-03-29 | Discharge: 2019-03-29 | Disposition: A | Discharge status: Home / Self Care | Payer: Medicare Other | Attending: Emergency Medicine | Admitting: Emergency Medicine

## 2019-03-29 ENCOUNTER — Emergency Department (HOSPITAL_COMMUNITY): Payer: Medicare Other

## 2019-03-29 DIAGNOSIS — I10 Essential (primary) hypertension: Secondary | ICD-10-CM

## 2019-03-29 DIAGNOSIS — R002 Palpitations: Secondary | ICD-10-CM | POA: Insufficient documentation

## 2019-03-29 DIAGNOSIS — R42 Dizziness and giddiness: Secondary | ICD-10-CM | POA: Diagnosis not present

## 2019-03-29 DIAGNOSIS — Z85828 Personal history of other malignant neoplasm of skin: Secondary | ICD-10-CM | POA: Diagnosis not present

## 2019-03-29 DIAGNOSIS — K219 Gastro-esophageal reflux disease without esophagitis: Secondary | ICD-10-CM | POA: Insufficient documentation

## 2019-03-29 DIAGNOSIS — I493 Ventricular premature depolarization: Secondary | ICD-10-CM | POA: Diagnosis not present

## 2019-03-29 DIAGNOSIS — E079 Disorder of thyroid, unspecified: Secondary | ICD-10-CM | POA: Insufficient documentation

## 2019-03-29 LAB — TSH: TSH: 2.228 u[IU]/mL (ref 0.350–4.500)

## 2019-03-29 LAB — TROPONIN I
Troponin I: <0.03 ng/mL (ref <0.03)
Troponin I: <0.03 ng/mL (ref <0.03)

## 2019-03-29 LAB — CBC
HCT: 39.2 % (ref 36.0–46.0)
Hemoglobin: 12.5 g/dL (ref 12.0–15.0)
MCH: 30.6 pg (ref 26.0–34.0)
MCHC: 31.9 g/dL (ref 30.0–36.0)
MCV: 96.1 fL (ref 80.0–100.0)
Platelets: 160 10*3/uL (ref 150–400)
RBC: 4.08 MIL/uL (ref 3.87–5.11)
RDW: 12.8 % (ref 11.5–15.5)
WBC: 5.8 10*3/uL (ref 4.0–10.5)
nRBC: 0 % (ref 0.0–0.2)

## 2019-03-29 LAB — BASIC METABOLIC PANEL
Anion gap: 11 (ref 5–15)
BUN: 18 mg/dL (ref 8–23)
CO2: 26 mmol/L (ref 22–32)
Calcium: 9.5 mg/dL (ref 8.9–10.3)
Chloride: 106 mmol/L (ref 98–111)
Creatinine, Ser: 1.18 mg/dL — ABNORMAL HIGH (ref 0.44–1.00)
GFR calc Af Amer: 52 mL/min — ABNORMAL LOW (ref >60)
GFR calc non Af Amer: 44 mL/min — ABNORMAL LOW (ref >60)
Glucose, Bld: 102 mg/dL — ABNORMAL HIGH (ref 70–99)
Potassium: 4.3 mmol/L (ref 3.5–5.1)
Sodium: 143 mmol/L (ref 135–145)

## 2019-03-29 LAB — MAGNESIUM: Magnesium: 2.2 mg/dL (ref 1.7–2.4)

## 2019-03-29 MED ORDER — METOPROLOL SUCCINATE ER 25 MG PO TB24: 25.0000 mg | ORAL_TABLET | Freq: Every day | ORAL | 0 refills | Status: DC

## 2019-03-29 NOTE — ED Notes (Addendum)
Pt ambulated independently to restroom

## 2019-03-29 NOTE — ED Triage Notes (Signed)
Pt reports her heart racing around 1600 today. No chest pain, no LOC, lightheaded and weakness.

## 2019-03-29 NOTE — ED Provider Notes (Signed)
[facility] Provider Note   CSN: [id number] Arrival date & time: 03/29/19  1741    History   Chief Complaint Chief Complaint  Patient presents with  . Palpitations    HPI CARLOTA Leon is a 78 y.o. female with past medical history of GERD, abnormal swallowing, depression, essential tremor, thyroid abnormalities, who presents today for evaluation of palpitations.  She reports that for over a year she has been having palpitations, however they they have gotten much more frequent.  She says that they are in the same location is usual however she has become lightheaded when standing with these palpitations.  She reports that she only drinks 1 cup of coffee a day and that is decaf.  She does not drink any sodas.  She denies any new lesions or changes to medicines and reports compliance with her home medicines.  She denies any leg swelling.  No shortness of breath.  No fevers at home or cough.  She does not have a cardiologist.     HPI  Past Medical History:  Diagnosis Date  . Anxiety   . Arthritis   . Chronic headaches   . Depression   . GERD (gastroesophageal reflux disease)   . HLD (hyperlipidemia)   . Internal hemorrhoids   . Osteoporosis   . Skin cancer    skin cancer    Patient Active Problem List   Diagnosis Date Noted  . Globus sensation 10/19/2018  . Essential hypertension 10/19/2018  . Weight loss 10/19/2018  . Alopecia totalis 10/27/2016  . Insomnia 10/27/2016  . Essential tremor 10/27/2016    Past Surgical History:  Procedure Laterality Date  . ABDOMINAL HYSTERECTOMY    . APPENDECTOMY    . COLONOSCOPY    . ESOPHAGEAL MANOMETRY  12/18/2012   Procedure: ESOPHAGEAL MANOMETRY (EM);  Surgeon: Lafayette Dragon, MD;  Location: [facility];  Service: Endoscopy;  Laterality: N/A;  . LAMINECTOMY    . TONSILLECTOMY       OB History   No obstetric history on file.      Home Medications    Prior to Admission  medications   Medication Sig Start Date End Date Taking? Authorizing Provider  metoprolol succinate (TOPROL-XL) 25 MG 24 hr tablet Take 1 tablet (25 mg total) by mouth daily. 03/29/19   Lorin Glass, PA-C  propranolol (INDERAL) 10 MG tablet Take 10 mg by mouth 2 (two) times daily as needed.     [provider]    Family History Family History  Problem Relation Age of Onset  . Breast cancer Daughter   . Thyroid disease Daughter   . Ulcers Father        peptic  . Throat cancer Mother 31  . Migraines Son   . Diabetes Neg Hx     Social History Social History   Tobacco Use  . Smoking status: Former Smoker    Types: Cigarettes  . Smokeless tobacco: Never Used  . Tobacco comment: when she was a teenager  Substance Use Topics  . Alcohol use: No  . Drug use: No     Allergies   Patient has no known allergies.   Review of Systems Review of Systems  Constitutional: Negative for chills, diaphoresis and fever.  HENT: Negative for congestion.   Respiratory: Negative for cough and chest tightness.   Cardiovascular: Positive for palpitations. Negative for chest pain and leg swelling.  Gastrointestinal: Negative for abdominal pain, diarrhea, nausea and vomiting.  Musculoskeletal: Negative for back pain.  Skin: Negative for color change, pallor and wound.  Neurological: Positive for light-headedness. Negative for headaches.  All other systems reviewed and are negative.    Physical Exam Updated Vital Signs BP 138/75   Pulse (!) 29   Temp 98.2 F (36.8 C) (Oral)   Resp 16   Ht 5\' 7"  (1.702 m)   Wt 54.4 kg   SpO2 100%   BMI 18.79 kg/m   Physical Exam Vitals signs and nursing note reviewed.  Constitutional:      General: She is not in acute distress.    Appearance: She is well-developed. She is not toxic-appearing.     Comments: Thin  HENT:     Head: Normocephalic and atraumatic.     Mouth/Throat:     Mouth: Mucous membranes are moist.  Eyes:      Conjunctiva/sclera: Conjunctivae normal.  Neck:     Musculoskeletal: Normal range of motion and neck supple.  Cardiovascular:     Rate and Rhythm: Normal rate. Rhythm irregular.     Heart sounds: No murmur.     Comments: It appears every 4th beat on tele is a PVC.  Pulmonary:     Effort: Pulmonary effort is normal. No respiratory distress.     Breath sounds: Normal breath sounds.  Abdominal:     General: There is no distension.     Palpations: Abdomen is soft.     Tenderness: There is no abdominal tenderness.  Musculoskeletal:        General: No tenderness.     Right lower leg: No edema.     Left lower leg: No edema.  Skin:    General: Skin is warm and dry.  Neurological:     General: No focal deficit present.     Mental Status: She is alert.     Comments: Tremor consistent with baseline  Psychiatric:        Mood and Affect: Mood normal.        Behavior: Behavior normal.      ED Treatments / Results  Labs (all labs ordered are listed, but only abnormal results are displayed) Labs Reviewed  BASIC METABOLIC PANEL - Abnormal; Notable for the following components:      Result Value   Glucose, Bld 102 (*)    Creatinine, Ser 1.18 (*)    GFR calc non Af Amer 44 (*)    GFR calc Af Amer 52 (*)    All other components within normal limits  CBC  TROPONIN I  TSH  MAGNESIUM  TROPONIN I    EKG None  Radiology Dg Chest Port 1 View  Result Date: 03/29/2019 CLINICAL DATA:  Heart palpitations EXAM: PORTABLE CHEST 1 VIEW COMPARISON:  None. FINDINGS: Cardiomegaly. Lungs clear. No effusions or pneumothorax. No acute bony abnormality. IMPRESSION: Cardiomegaly.  No active disease. Electronically Signed   By: Rolm Baptise M.D.   On: 03/29/2019 22:09    Procedures Procedures (including critical care time)  Medications Ordered in ED Medications - No data to display   Initial Impression / Assessment and Plan / ED Course  I have reviewed the triage vital signs and the nursing  notes.  Pertinent labs & imaging results that were available during my care of the patient were reviewed by me and considered in my medical decision making (see chart for details).  Clinical Course as of Mar 28 2314  Thu Mar 29, 2019  1838 In room patient having frequent PVCs.  It appears every 4th beat is a PVC.     [EH]  2115 Spoke with dr. Aundra Dubin from cardiology, he recommended starting metoprolol xr 25mg  daily and follow up with cards.    [EH]    Clinical Course User Index [EH] Lorin Glass, PA-C      Patient presents today for evaluation of palpitations that have significantly worsened today.  She does not have significant caffeine use, and does not have a cardiologist.  She reports occasionally feeling lightheaded with these palpitations.  Rhythm monitoring shows that approximately every fourth beat is a PVC however she does not report the feeling of palpitations with all of these PVCs.  Labs are obtained and reviewed, she does not have any significant hematologic or electrolyte abnormalities.  Her creatinine is slightly lower than previous, however this may represent dehydration, and is not significant enough to require admission.  Troponin x2 is not elevated.  Chest x-ray shows mild cardiomegaly without evidence of other acute abnormalities.  Given that she is symptomatic, however not orthostatic, I spoke with cardiology who recommended Starting patient on metoprolol xr 25 mg daily and follow-up with cardiology.  This patient was seen as a shared visit with Dr. Wilson Singer.    Return precautions were discussed with patient who states their understanding.  At the time of discharge patient denied any unaddressed complaints or concerns.  Patient is agreeable for discharge home.   Final Clinical Impressions(s) / ED Diagnoses   Final diagnoses:  Palpitations  PVC (premature ventricular contraction)    ED Discharge Orders         Ordered    metoprolol succinate (TOPROL-XL) 25 MG  24 hr tablet  Daily     03/29/19 2221           Ollen Gross 03/29/19 2315    Virgel Manifold, MD 03/30/19 2022

## 2019-03-29 NOTE — ED Notes (Signed)
Patient transported to ED for continuum of care and cardiac monitoring.

## 2019-03-29 NOTE — Discharge Instructions (Addendum)
Today your heart enzyme was normal.  Your chest x-ray shows that your heart is slightly enlarged, which is often something that happens over the course of many years.  Your kidney numbers show that you may be dehydrated, however please get these rechecked by your primary care doctor in approximately 1 week.  I have given you a prescription for metoprolol which should help your palpitations.  Do not take your propranolol while taking the metoprolol.  Please avoid caffeine.  Please call the cardiologist to set up a follow-up appointment.  If you develop chest pain, shortness of breath, your symptoms worsen or become concerning in any way please seek additional medical care and evaluation.

## 2019-03-29 NOTE — ED Provider Notes (Addendum)
Suzanne Leon    CSN: [id number] Arrival date & time: 03/29/19  1620     History   Chief Complaint Chief Complaint  Patient presents with  . Palpitations    HPI Suzanne Leon is a 78 y.o. female with a history of hyperlipidemia, tremors comes to urgent care with complaints of palpitations which is worsened over the past couple of weeks.  Patient says that she has had palpitations over a year ago but over the past couple of weeks she is noticed that the palpitations have worsened and she feels like her heart sometimes stops beating. No known aggravating or relieving factors. She has developed some dizziness, lightheadedness and near fainting episodes with palpitations.  No nausea or vomiting.   Patient denies any chest pain or chest pressure.  No fever or chills.  No cough or sputum production.  Patient has good appetite but she continues to lose weight.  HPI  Past Medical History:  Diagnosis Date  . Anxiety   . Arthritis   . Chronic headaches   . Depression   . GERD (gastroesophageal reflux disease)   . HLD (hyperlipidemia)   . Internal hemorrhoids   . Osteoporosis   . Skin cancer    skin cancer    Patient Active Problem List   Diagnosis Date Noted  . Globus sensation 10/19/2018  . Essential hypertension 10/19/2018  . Weight loss 10/19/2018  . Alopecia totalis 10/27/2016  . Insomnia 10/27/2016  . Essential tremor 10/27/2016    Past Surgical History:  Procedure Laterality Date  . ABDOMINAL HYSTERECTOMY    . APPENDECTOMY    . COLONOSCOPY    . ESOPHAGEAL MANOMETRY  12/18/2012   Procedure: ESOPHAGEAL MANOMETRY (EM);  Surgeon: Lafayette Dragon, MD;  Location: [facility];  Service: Endoscopy;  Laterality: N/A;  . LAMINECTOMY    . TONSILLECTOMY      OB History   No obstetric history on file.      Home Medications    Prior to Admission medications   Medication Sig Start Date End Date Taking? Authorizing Provider  propranolol (INDERAL) 10 MG tablet  Take 10 mg by mouth 2 (two) times daily as needed.    Yes [provider]    Family History Family History  Problem Relation Age of Onset  . Breast cancer Daughter   . Thyroid disease Daughter   . Ulcers Father        peptic  . Throat cancer Mother 59  . Migraines Son   . Diabetes Neg Hx     Social History Social History   Tobacco Use  . Smoking status: Former Smoker    Types: Cigarettes  . Smokeless tobacco: Never Used  . Tobacco comment: when she was a teenager  Substance Use Topics  . Alcohol use: No  . Drug use: No     Allergies   Patient has no known allergies.   Review of Systems Review of Systems  Constitutional: Negative for activity change, appetite change and fever.  HENT: Negative.   Eyes: Negative.   Respiratory: Negative for cough, chest tightness, shortness of breath and wheezing.   Cardiovascular: Positive for palpitations. Negative for chest pain.  Gastrointestinal: Negative for abdominal distention, constipation, nausea and vomiting.  Genitourinary: Negative for dysuria, frequency and urgency.  Musculoskeletal: Negative.   Skin: Negative for color change and rash.  Neurological: Positive for dizziness, weakness and light-headedness.     Physical Exam Triage Vital Signs ED Triage Vitals  Enc Vitals  Group     BP 03/29/19 1631 (!) 171/74     Pulse Rate 03/29/19 1631 75     Resp --      Temp 03/29/19 1631 98 F (36.7 C)     Temp src --      SpO2 03/29/19 1631 95 %     Weight --      Height --      Head Circumference --      Peak Flow --      Pain Score 03/29/19 1636 0     Pain Loc --      Pain Edu? --      Excl. in [place]? --    No data found.  Updated Vital Signs BP (!) 171/74   Pulse 75   Temp 98 F (36.7 C)   SpO2 95%   Visual Acuity Right Eye Distance:   Left Eye Distance:   Bilateral Distance:    Right Eye Near:   Left Eye Near:    Bilateral Near:     Physical Exam Constitutional:      Appearance: She is  ill-appearing.  Neck:     Musculoskeletal: Normal range of motion.  Cardiovascular:     Rate and Rhythm: Normal rate and regular rhythm.     Pulses: Normal pulses.     Heart sounds: No murmur. No friction rub. Gallop present.   Pulmonary:     Effort: Pulmonary effort is normal.     Breath sounds: Normal breath sounds.  Skin:    General: Skin is warm.     Capillary Refill: Capillary refill takes less than 2 seconds.     Coloration: Skin is not jaundiced.     Findings: No bruising.  Neurological:     General: No focal deficit present.     Mental Status: She is alert and oriented to person, place, and time.      UC Treatments / Results  Labs (all labs ordered are listed, but only abnormal results are displayed) Labs Reviewed - No data to display  EKG None  Radiology No results found.  Procedures Procedures (including critical care time)  Medications Ordered in UC Medications - No data to display  Initial Impression / Assessment and Plan / UC Course  I have reviewed the triage vital signs and the nursing notes.  Pertinent labs & imaging results that were available during my care of the patient were reviewed by me and considered in my medical decision making (see chart for details).     1.  Palpitations with dizziness/near syncope, suspected symptomatic arrhythmia: EKG showed sinus rhythm with frequent PVCs Given the patient's symptoms with PVCs, she might need further work-up and may benefit from telemetry monitoring Cardiology evaluation will be beneficial Patient is advised to go to emergency department for further work-up and monitoring Patient verbalized understanding and is willing to go to the emergency department. Final Clinical Impressions(s) / UC Diagnoses   Final diagnoses:  Palpitations   Discharge Instructions   None    ED Prescriptions    None     Controlled Substance Prescriptions [place] Controlled Substance Registry consulted? No   Chase Picket, MD 03/29/19 1914    Chase Picket, MD 03/29/19 2012    Chase Picket, MD 04/02/19 1052    Chase Picket, MD 04/02/19 [phone]

## 2019-03-29 NOTE — ED Notes (Signed)
Pt reports having issues swallowing over the last year with increasing weight loss. Pt maintains good appetite.

## 2019-03-29 NOTE — ED Triage Notes (Signed)
Has had palpitations for last year but states today has been more sever

## 2019-03-31 NOTE — Progress Notes (Signed)
Virtual Visit via Telephone Note   This visit type was conducted due to national recommendations for restrictions regarding the COVID-19 Pandemic (e.g. social distancing) in an effort to limit this patient's exposure and mitigate transmission in our community.  Due to her co-morbid illnesses, this patient is at least at moderate risk for complications without adequate follow up.  This format is felt to be most appropriate for this patient at this time.  The patient did not have access to video technology/had technical difficulties with video requiring transitioning to audio format only (telephone).  All issues noted in this document were discussed and addressed.  No physical exam could be performed with this format.  Please refer to the patient's chart for her  consent to telehealth for [facility].     Evaluation Performed:  New patient visit.    Date:  04/02/2019   ID:  Suzanne Leon, DOB May 14, 1941, MRN [id number]  Patient Location: Home Provider Location: Home  PCP:  Leanna Battles, MD  Cardiologist:  Minus Breeding, MD  Electrophysiologist:  None   Chief Complaint:  Palpitations  History of Present Illness:    Suzanne Leon is a 78 y.o. female who is referred by the ED for evaluation of palpitations.   She was seen two days ago for evaluation of this in the ED.  I reviewed these records for this visit. She was found to have PVCs.  She was started on metoprolol.       The patient has no significant past cardiac history.  She said in October she started feeling her heart pounding.  This was a couple of days after she received a flu shot.  She felt palpitations, presyncope or syncope.  She did not have any problems she got prior to this.  She might get breathless with this.  She might get a little diaphoretic.  These would come on sporadically.  She could not bring them on.  She went to the emergency room the other day and I did review this.  Her EKG did demonstrate premature  ventricular contractions.  She was given a beta-blocker and she thinks she is had a little improvement with this.  She is active in her yard.  She denies any chest pressure, neck or arm discomfort.  She does not have any inducible shortness of breath, PND or orthopnea.  She is had no weight gain or edema.  I did review recent labs to include a thyroid which was normal.   The patient does not have symptoms concerning for COVID-19 infection (fever, chills, cough, or new shortness of breath).    Past Medical History:  Diagnosis Date  . Anxiety   . Arthritis   . Chronic headaches   . Depression   . GERD (gastroesophageal reflux disease)   . HLD (hyperlipidemia)   . Internal hemorrhoids   . Osteoporosis   . Skin cancer    skin cancer   Past Surgical History:  Procedure Laterality Date  . ABDOMINAL HYSTERECTOMY    . APPENDECTOMY    . COLONOSCOPY    . ESOPHAGEAL MANOMETRY  12/18/2012   Procedure: ESOPHAGEAL MANOMETRY (EM);  Surgeon: Lafayette Dragon, MD;  Location: [facility];  Service: Endoscopy;  Laterality: N/A;  . LAMINECTOMY    . TONSILLECTOMY       Current Meds  Medication Sig  . metoprolol succinate (TOPROL-XL) 25 MG 24 hr tablet Take 1 tablet (25 mg total) by mouth daily.     Allergies:  Patient has no known allergies.   Social History   Tobacco Use  . Smoking status: Former Smoker    Types: Cigarettes  . Smokeless tobacco: Never Used  . Tobacco comment: when she was a teenager  Substance Use Topics  . Alcohol use: No  . Drug use: No     Family Hx: The patient's family history includes Breast cancer in her daughter; Migraines in her son; Throat cancer (age of onset: 2) in her mother; Thyroid disease in her daughter; Ulcers in her father. There is no history of Diabetes.  ROS:   Please see the history of present illness.    Positive for tremors Otherwise as stated in the HPI and negative for all other systems.   Prior CV studies:   The following studies were  reviewed today:  ED, EKG  Labs/Other Tests and Data Reviewed:    EKG:  NSR, rate 68, left axis deviation, premature ventricular contractions, poor anterior R wave progression, no acute ST-T wave changes.  Recent Labs: 09/26/2018: ALT 16 03/29/2019: BUN 18; Creatinine, Ser 1.18; Hemoglobin 12.5; Magnesium 2.2; Platelets 160; Potassium 4.3; Sodium 143; TSH 2.228   Recent Lipid Panel Lab Results  Component Value Date/Time   CHOL 250 (A) 09/26/2018   TRIG 66 09/26/2018   HDL 79 (A) 09/26/2018   LDLCALC 158 09/26/2018    Wt Readings from Last 3 Encounters:  04/02/19 121 lb (54.9 kg)  03/29/19 120 lb (54.4 kg)  10/19/18 120 lb 0.8 oz (54.5 kg)     Objective:    Vital Signs:  BP 123/60   Ht 5\' 7"  (1.702 m)   Wt 121 lb (54.9 kg)   BMI 18.95 kg/m    VITAL SIGNS:  reviewed NA  ASSESSMENT & PLAN:    PVCs: I reviewed the ER records.  At this point the patient needs a 2-week event monitor.  I will then need to see her back in the office after this.  Further evaluation will be based on this.  COVID-19 Education: The signs and symptoms of COVID-19 were discussed with the patient and how to seek care for testing (follow up with PCP or arrange E-visit).  The importance of social distancing was discussed today.  Time:   Today, I have spent 21 minutes with the patient with telehealth technology discussing the above problems.     Medication Adjustments/Labs and Tests Ordered: Current medicines are reviewed at length with the patient today.  Concerns regarding medicines are outlined above.   Tests Ordered: No orders of the defined types were placed in this encounter.   Medication Changes: No orders of the defined types were placed in this encounter.   Disposition:  Follow up See me after the monitor.   Signed, Minus Breeding, MD  04/02/2019 1:42 PM    [facility]

## 2019-04-02 ENCOUNTER — Encounter: Payer: Self-pay | Admitting: Cardiology

## 2019-04-02 ENCOUNTER — Telehealth (INDEPENDENT_AMBULATORY_CARE_PROVIDER_SITE_OTHER): Payer: Medicare Other | Admitting: Cardiology

## 2019-04-02 VITALS — BP 123/60 | Ht 67.0 in | Wt 121.0 lb

## 2019-04-02 DIAGNOSIS — Z7189 Other specified counseling: Secondary | ICD-10-CM

## 2019-04-02 DIAGNOSIS — I493 Ventricular premature depolarization: Secondary | ICD-10-CM | POA: Insufficient documentation

## 2019-04-02 HISTORY — DX: Other specified counseling: Z71.89

## 2019-04-02 NOTE — Addendum Note (Signed)
Addended by: Vennie Homans on: 04/02/2019 02:10 PM   Modules accepted: Orders

## 2019-04-02 NOTE — Patient Instructions (Addendum)
Medication Instructions:  Continue current medications  If you need a refill on your cardiac medications before your next appointment, please call your pharmacy.  Labwork: None Ordered   Testing/Procedures: Your physician has recommended that you wear a Zio Patch monitor 2 weeks. Holter monitors are medical devices that record the heart's electrical activity. Doctors most often use these monitors to diagnose arrhythmias. Arrhythmias are problems with the speed or rhythm of the heartbeat. The monitor is a small, portable device. You can wear one while you do your normal daily activities. This is usually used to diagnose what is causing palpitations/syncope (passing out).   Follow-Up: . Your physician recommends that you schedule a follow-up appointment in: 5 weeks    At [facility], you and your health needs are our priority.  As part of our continuing mission to provide you with exceptional heart care, we have created designated Provider Care Teams.  These Care Teams include your primary Cardiologist (physician) and Advanced Practice Providers (APPs -  Physician Assistants and Nurse Practitioners) who all work together to provide you with the care you need, when you need it.  Thank you for choosing [facility] at [facility]!!

## 2019-06-06 ENCOUNTER — Encounter: Payer: Self-pay | Admitting: Cardiology

## 2019-07-12 ENCOUNTER — Telehealth: Payer: Self-pay

## 2019-07-17 DIAGNOSIS — H18413 Arcus senilis, bilateral: Secondary | ICD-10-CM | POA: Diagnosis not present

## 2019-07-17 DIAGNOSIS — H25043 Posterior subcapsular polar age-related cataract, bilateral: Secondary | ICD-10-CM | POA: Diagnosis not present

## 2019-07-17 DIAGNOSIS — H25013 Cortical age-related cataract, bilateral: Secondary | ICD-10-CM | POA: Diagnosis not present

## 2019-07-17 DIAGNOSIS — H2513 Age-related nuclear cataract, bilateral: Secondary | ICD-10-CM | POA: Diagnosis not present

## 2019-07-17 DIAGNOSIS — H35372 Puckering of macula, left eye: Secondary | ICD-10-CM | POA: Diagnosis not present

## 2019-07-17 DIAGNOSIS — H2511 Age-related nuclear cataract, right eye: Secondary | ICD-10-CM | POA: Diagnosis not present

## 2019-07-20 ENCOUNTER — Encounter: Payer: Self-pay | Admitting: Cardiology

## 2019-07-23 ENCOUNTER — Other Ambulatory Visit: Payer: Self-pay

## 2019-07-23 ENCOUNTER — Ambulatory Visit (INDEPENDENT_AMBULATORY_CARE_PROVIDER_SITE_OTHER): Payer: Medicare Other | Admitting: Cardiology

## 2019-07-23 ENCOUNTER — Telehealth: Payer: Self-pay

## 2019-07-23 ENCOUNTER — Encounter: Payer: Self-pay | Admitting: Cardiology

## 2019-07-23 VITALS — BP 176/91 | HR 84 | Temp 98.4°F | Ht 67.0 in | Wt 114.0 lb

## 2019-07-23 DIAGNOSIS — I10 Essential (primary) hypertension: Secondary | ICD-10-CM | POA: Diagnosis not present

## 2019-07-23 DIAGNOSIS — R634 Abnormal weight loss: Secondary | ICD-10-CM

## 2019-07-23 DIAGNOSIS — R0602 Shortness of breath: Secondary | ICD-10-CM

## 2019-07-23 DIAGNOSIS — R1310 Dysphagia, unspecified: Secondary | ICD-10-CM

## 2019-07-23 DIAGNOSIS — R09A2 Foreign body sensation, throat: Secondary | ICD-10-CM

## 2019-07-23 DIAGNOSIS — R198 Other specified symptoms and signs involving the digestive system and abdomen: Secondary | ICD-10-CM

## 2019-07-23 DIAGNOSIS — R0989 Other specified symptoms and signs involving the circulatory and respiratory systems: Secondary | ICD-10-CM | POA: Diagnosis not present

## 2019-07-23 MED ORDER — AMLODIPINE BESYLATE 5 MG PO TABS: 5.0000 mg | ORAL_TABLET | Freq: Every day | ORAL | 1 refills | Status: DC

## 2019-07-23 MED ORDER — VALSARTAN-HYDROCHLOROTHIAZIDE 160-12.5 MG PO TABS: 1.0000 | ORAL_TABLET | Freq: Every day | ORAL | 1 refills | Status: DC

## 2019-07-23 NOTE — Progress Notes (Signed)
Primary Physician:  Leanna Battles, MD   Patient ID: Suzanne Leon, female    DOB: 02-05-1941, 78 y.o.   MRN: [id number]  Subjective:    Chief Complaint  Patient presents with  . Shortness of Breath  . Follow-up    HPI: GLYNIS HUNSUCKER  is a 78 y.o. female  with  HTN and HLD, not on therapy for either.   She was last seen in 2017, underwent treadmill stress test at that time that was without EKG changes, but did show low normal exercise capacity for age. Echocardiogram also at that time revealed normal LVEF, mild MVP with mild MR.  Her main complaint is difficulty swallowing and throat soreness. She has lost 40 lbs over the last few years. Has some shortness of breath associated with this. She has had extensive evaluation with ENT before that was unyielding.She does walk approximately 2 miles regularly that she tolerates well. She denies any chest pain, PND or orthopnea. Throat soreness is persistent.    Past Medical History:  Diagnosis Date  . Anxiety   . Arthritis   . Chronic headaches   . Depression   . GERD (gastroesophageal reflux disease)   . HLD (hyperlipidemia)   . Internal hemorrhoids   . Osteoporosis   . Skin cancer    skin cancer    Past Surgical History:  Procedure Laterality Date  . ABDOMINAL HYSTERECTOMY    . APPENDECTOMY    . COLONOSCOPY    . ESOPHAGEAL MANOMETRY  12/18/2012   Procedure: ESOPHAGEAL MANOMETRY (EM);  Surgeon: Lafayette Dragon, MD;  Location: [facility];  Service: Endoscopy;  Laterality: N/A;  . LAMINECTOMY    . TONSILLECTOMY      Social History   Socioeconomic History  . Marital status: Widowed    Spouse name: Not on file  . Number of children: 1  . Years of education: Not on file  . Highest education level: Not on file  Occupational History  . Occupation: retired  Scientific laboratory technician  . Financial resource strain: Not hard at all  . Food insecurity    Worry: Not on file    Inability: Not on file  . Transportation needs   Medical: No    Non-medical: No  Tobacco Use  . Smoking status: Former Smoker    Types: Cigarettes  . Smokeless tobacco: Never Used  . Tobacco comment: when she was a teenager  Substance and Sexual Activity  . Alcohol use: No  . Drug use: No  . Sexual activity: Not on file  Lifestyle  . Physical activity    Days per week: Not on file    Minutes per session: Not on file  . Stress: Not on file  Relationships  . Social Herbalist on phone: Not on file    Gets together: Not on file    Attends religious service: Not on file    Active member of club or organization: Not on file    Attends meetings of clubs or organizations: Not on file    Relationship status: Not on file  . Intimate partner violence    Fear of current or ex partner: Not on file    Emotionally abused: Not on file    Physically abused: Not on file    Forced sexual activity: Not on file  Other Topics Concern  . Not on file  Social History Narrative   Lives alone, independent   Walks regularly, rakes    Son,  Coralyn Mark lives next door   One grandchild   Attends church   Psych: denies depressed mood or anxiety    Review of Systems  Constitution: Positive for weight loss. Negative for decreased appetite, malaise/fatigue and weight gain.  HENT: Positive for hoarse voice and sore throat (difficulty swallowing).   Eyes: Negative for visual disturbance.  Cardiovascular: Negative for chest pain, claudication, dyspnea on exertion, leg swelling, orthopnea, palpitations and syncope.  Respiratory: Positive for shortness of breath. Negative for hemoptysis and wheezing.   Endocrine: Negative for cold intolerance and heat intolerance.  Hematologic/Lymphatic: Does not bruise/bleed easily.  Skin: Negative for nail changes.  Musculoskeletal: Negative for muscle weakness and myalgias.  Gastrointestinal: Negative for abdominal pain, change in bowel habit, nausea and vomiting.  Neurological: Negative for difficulty with  concentration, dizziness, focal weakness and headaches.  Psychiatric/Behavioral: Negative for altered mental status and suicidal ideas.  All other systems reviewed and are negative.     Objective:  Blood pressure (!) 176/91, pulse 84, temperature 98.4 F (36.9 C), height 5\' 7"  (1.702 m), weight 114 lb (51.7 kg), SpO2 99 %. Body mass index is 17.85 kg/m.    Physical Exam  Constitutional: She is oriented to person, place, and time. Vital signs are normal. She appears well-developed and well-nourished.  HENT:  Head: Normocephalic and atraumatic.  Neck: Normal range of motion.  Cardiovascular: Normal rate, regular rhythm, normal heart sounds and intact distal pulses.  Pulmonary/Chest: Effort normal and breath sounds normal. No accessory muscle usage. No respiratory distress.  Abdominal: Soft. Bowel sounds are normal.  Musculoskeletal: Normal range of motion.  Neurological: She is alert and oriented to person, place, and time.  Skin: Skin is warm and dry.  Vitals reviewed.  Radiology: No results found.  Laboratory examination:    CMP Latest Ref Rng & Units 03/29/2019 09/26/2018  Glucose 70 - 99 mg/dL 102(H) -  BUN 8 - 23 mg/dL 18 17  Creatinine 0.44 - 1.00 mg/dL 1.18(H) 0.9  Sodium 135 - 145 mmol/L 143 143  Potassium 3.5 - 5.1 mmol/L 4.3 4.1  Chloride 98 - 111 mmol/L 106 -  CO2 22 - 32 mmol/L 26 -  Calcium 8.9 - 10.3 mg/dL 9.5 -  Alkaline Phos 25 - 125 - 72  AST 13 - 35 - 22  ALT 7 - 35 - 16   CBC Latest Ref Rng & Units 03/29/2019 09/26/2018 07/10/2009  WBC 4.0 - 10.5 K/uL 5.8 4.0 5.6  Hemoglobin 12.0 - 15.0 g/dL 12.5 11.9(A) 12.2  Hematocrit 36.0 - 46.0 % 39.2 38 36.2  Platelets 150 - 400 K/uL 160 - 162   Lipid Panel     Component Value Date/Time   CHOL 250 (A) 09/26/2018   TRIG 66 09/26/2018   HDL 79 (A) 09/26/2018   LDLCALC 158 09/26/2018   HEMOGLOBIN A1C No results found for: HGBA1C, MPG TSH Recent Labs    09/25/18 03/29/19 1810  TSH 1.18 2.228    PRN  Meds:. Medications Discontinued During This Encounter  Medication Reason  . metoprolol succinate (TOPROL-XL) 25 MG 24 hr tablet    Current Meds  Medication Sig  . propranolol (INDERAL) 10 MG tablet Take 10 mg by mouth daily as needed.    Cardiac Studies:   Echocardiogram 06/29/2016: Left ventricle cavity is normal in size. Normal global wall motion. Normal diastolic filling pattern. Calculated EF 60%. Mild prolapse of the mitral valve leaflets. Mild (Grade I) mitral regurgitation. Mild to moderate tricuspid regurgitation. No evidence of pulmonary hypertension.  Mild to moderate pulmonic regurgitation.  Treadmill exercise stress test 06/28/2016: Indications: Shortness of breath, Abn ECG The resting electrocardiogram demonstrated normal sinus rhythm, normal resting conduction, no resting arrhythmias and normal rest repolarization. The stress electrocardiogram was normal. Upsloping ST depression with exercise back to baseline at < 17mSec of QRS. There were no significant arrhythmias. Patient exercised on Bruce protocol for 5:00 minutes and achieved 93% of Max Predicted HR (Target HR was >85% MPHR) and 7.02 METS. Stress symptoms included fatigue and dyspnea. Normal BP response.  Impression: Normal stress EKG. Low normal exercise capacity for age.  Assessment:     ICD-10-CM   1. SOB (shortness of breath)  R06.02 EKG 12-Lead  2. Dysphagia, unspecified type  R13.10   3. Globus sensation  R09.89   4. Essential hypertension  I10     EKG 07/23/2019: Normal sinus rhythm at the rate of 50 bpm, left axis deviation, left anterior fascicular block. Poor R-wave progression, cannot exclude anterior infarct old. T-wave abnormality, nonspecific in the inferior and anterior leads. Low-voltage complexes. Abnormal EKG. No change from EKG 07/04/2016.  Recommendations:   Patient has had worsening dysphagia, sore throat for the last 2 years with negative evaluation by ENT despite multiple barium  swallow studies. She has had negative treadmill stress testing and echocardiogram in 2017. I do not feel that she needs repeat cardiac at this time as I do not suspect cardiac etiology. She is now having weight loss and has family history of throat cancer. She may benefit from GI evaluation, will defer to PCP.   Her blood pressure is markedly elevated. She was recently started on Valsartan HCT; however, states she did not tolerate due to headache. I will start her on amlodipine 5 mg daily. She is scheduled for follow up with Dr. Philip Aspen in Oct and will follow up on her blood pressure at that time as well.   She has abnormal EKG that is unchanged compared to 2017. Physical exam is unremarkable. If she tolerates amlodipine, will ask Dr. Philip Aspen to take over prescribing amlodipine. I have urged her to contact me if further evaluation is unyielding, otherwise, I will see her back on a PRN basis.   *I have discussed this case with Dr. Virgina Jock and he personally examined the patient and participated in formulating the plan.*    Miquel Dunn, MSN, APRN, FNP-C [facility]. [place] Office: [phone] Fax: [phone]

## 2019-07-23 NOTE — Telephone Encounter (Signed)
Contacted pharmacy to advise to not fill Valsartan, pt has an intolerance to this medication.

## 2019-07-30 DIAGNOSIS — H2511 Age-related nuclear cataract, right eye: Secondary | ICD-10-CM | POA: Diagnosis not present

## 2019-07-30 DIAGNOSIS — H25013 Cortical age-related cataract, bilateral: Secondary | ICD-10-CM | POA: Diagnosis not present

## 2019-07-31 DIAGNOSIS — H2512 Age-related nuclear cataract, left eye: Secondary | ICD-10-CM | POA: Diagnosis not present

## 2019-07-31 IMAGING — CR PORTABLE CHEST - 1 VIEW
1 series · 1 of 1 positions shown · non-contrast
Comparison: None.

CLINICAL DATA: Heart palpitations

EXAM:
PORTABLE CHEST 1 VIEW

[AP]
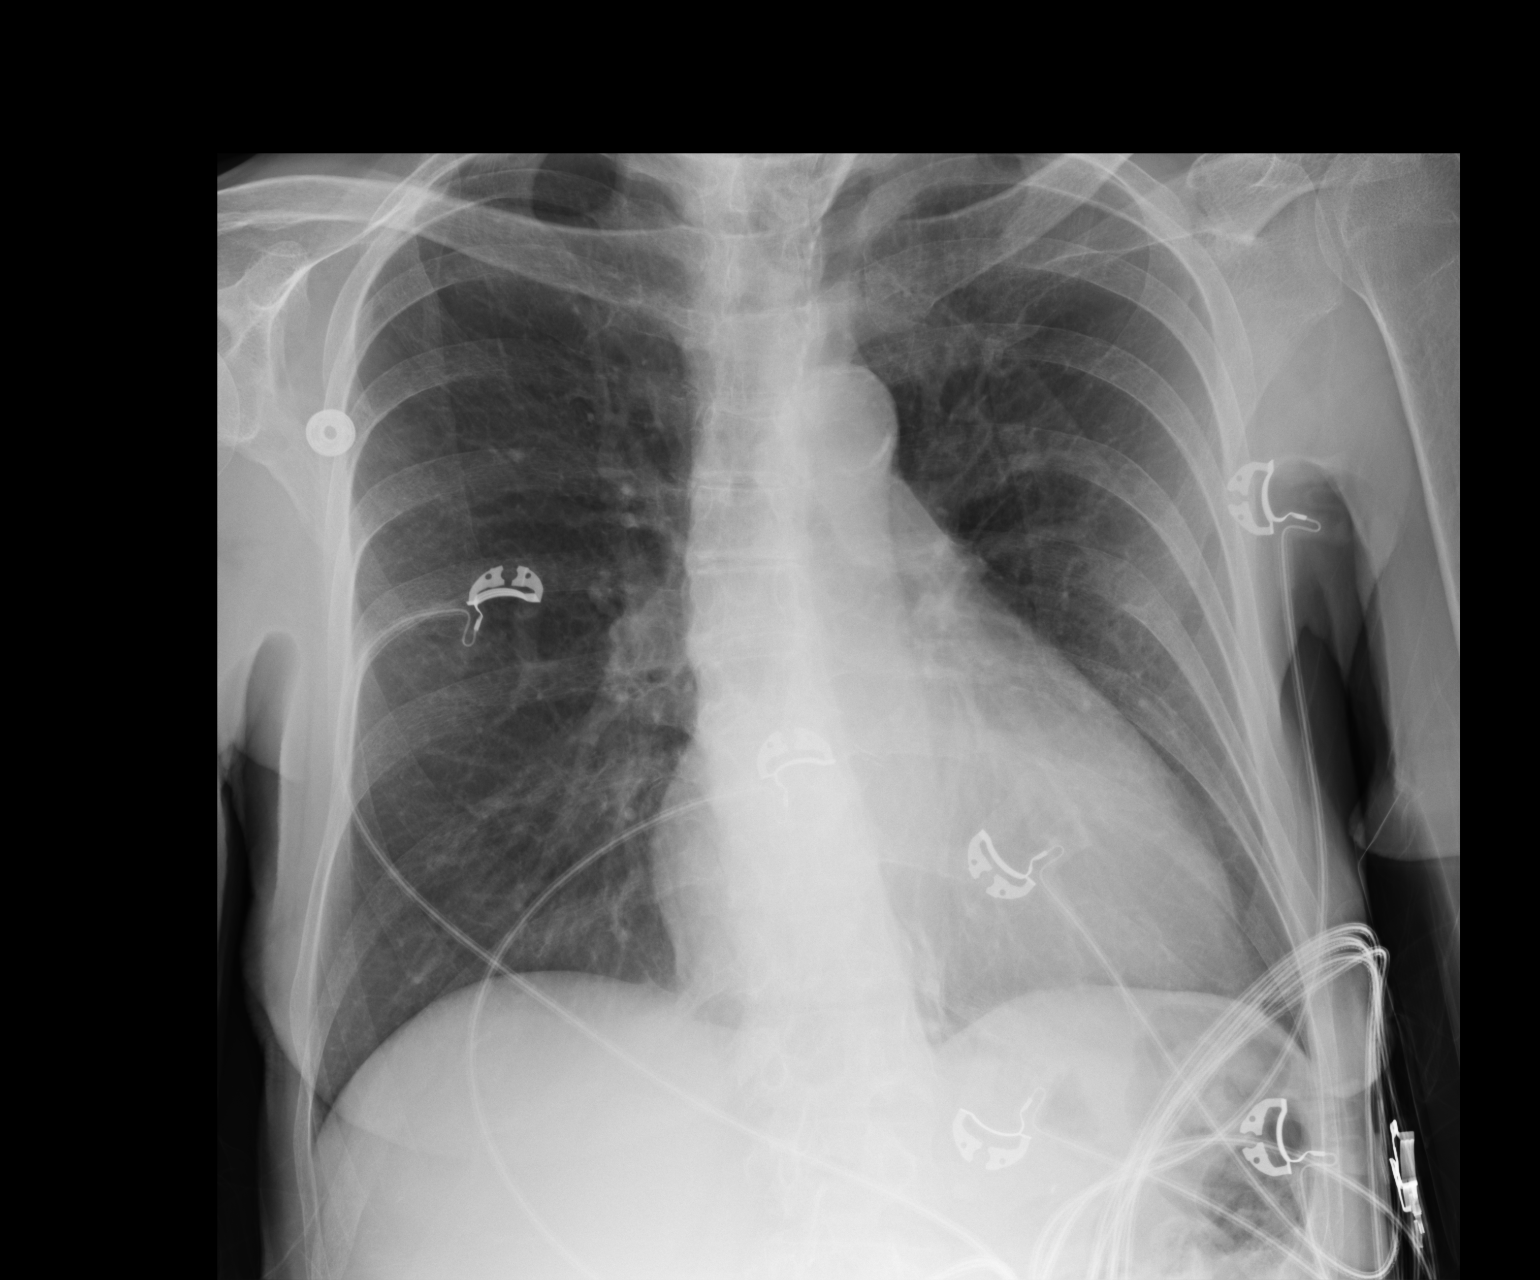

[1 of 1 positions shown; findings below may reference images not displayed]

FINDINGS: Cardiomegaly. Lungs clear. No effusions or pneumothorax. No acute
bony abnormality.
IMPRESSION: Cardiomegaly.  No active disease.

## 2019-08-20 DIAGNOSIS — H2512 Age-related nuclear cataract, left eye: Secondary | ICD-10-CM | POA: Diagnosis not present

## 2019-08-27 ENCOUNTER — Ambulatory Visit: Payer: Self-pay | Admitting: Cardiology

## 2019-10-04 DIAGNOSIS — L820 Inflamed seborrheic keratosis: Secondary | ICD-10-CM | POA: Diagnosis not present

## 2019-10-04 DIAGNOSIS — H61001 Unspecified perichondritis of right external ear: Secondary | ICD-10-CM | POA: Diagnosis not present

## 2019-10-05 DIAGNOSIS — M810 Age-related osteoporosis without current pathological fracture: Secondary | ICD-10-CM | POA: Diagnosis not present

## 2019-10-05 DIAGNOSIS — E038 Other specified hypothyroidism: Secondary | ICD-10-CM | POA: Diagnosis not present

## 2019-10-05 DIAGNOSIS — E7849 Other hyperlipidemia: Secondary | ICD-10-CM | POA: Diagnosis not present

## 2019-10-12 ENCOUNTER — Other Ambulatory Visit: Payer: Self-pay | Admitting: Gastroenterology

## 2019-10-12 ENCOUNTER — Other Ambulatory Visit: Payer: Self-pay | Admitting: Internal Medicine

## 2019-10-12 DIAGNOSIS — E039 Hypothyroidism, unspecified: Secondary | ICD-10-CM | POA: Diagnosis not present

## 2019-10-12 DIAGNOSIS — R634 Abnormal weight loss: Secondary | ICD-10-CM

## 2019-10-12 DIAGNOSIS — G4700 Insomnia, unspecified: Secondary | ICD-10-CM | POA: Diagnosis not present

## 2019-10-12 DIAGNOSIS — R1310 Dysphagia, unspecified: Secondary | ICD-10-CM | POA: Diagnosis not present

## 2019-10-12 DIAGNOSIS — I10 Essential (primary) hypertension: Secondary | ICD-10-CM | POA: Diagnosis not present

## 2019-10-12 DIAGNOSIS — R82998 Other abnormal findings in urine: Secondary | ICD-10-CM | POA: Diagnosis not present

## 2019-10-12 DIAGNOSIS — Z0000 Encounter for general adult medical examination without abnormal findings: Secondary | ICD-10-CM | POA: Diagnosis not present

## 2019-10-12 DIAGNOSIS — Z1339 Encounter for screening examination for other mental health and behavioral disorders: Secondary | ICD-10-CM | POA: Diagnosis not present

## 2019-10-12 DIAGNOSIS — E785 Hyperlipidemia, unspecified: Secondary | ICD-10-CM | POA: Diagnosis not present

## 2019-10-12 DIAGNOSIS — Z1331 Encounter for screening for depression: Secondary | ICD-10-CM | POA: Diagnosis not present

## 2019-10-15 DIAGNOSIS — Z1212 Encounter for screening for malignant neoplasm of rectum: Secondary | ICD-10-CM | POA: Diagnosis not present

## 2019-10-22 ENCOUNTER — Ambulatory Visit
Admission: RE | Admit: 2019-10-22 | Discharge: 2019-10-22 | Disposition: A | Discharge status: Home / Self Care | Payer: Medicare Other | Source: Ambulatory Visit | Attending: Internal Medicine | Admitting: Internal Medicine

## 2019-10-22 DIAGNOSIS — R911 Solitary pulmonary nodule: Secondary | ICD-10-CM | POA: Diagnosis not present

## 2019-10-22 DIAGNOSIS — K5730 Diverticulosis of large intestine without perforation or abscess without bleeding: Secondary | ICD-10-CM | POA: Diagnosis not present

## 2019-10-22 DIAGNOSIS — R634 Abnormal weight loss: Secondary | ICD-10-CM

## 2019-10-22 MED ORDER — IOPAMIDOL (ISOVUE-300) INJECTION 61%
100.0000 mL | Freq: Once | INTRAVENOUS | Status: AC | PRN
  Administered 2019-10-22: 100 mL via INTRAVENOUS

## 2019-10-31 ENCOUNTER — Ambulatory Visit (INDEPENDENT_AMBULATORY_CARE_PROVIDER_SITE_OTHER): Payer: Medicare Other | Admitting: Cardiology

## 2019-10-31 ENCOUNTER — Encounter: Payer: Self-pay | Admitting: Cardiology

## 2019-10-31 ENCOUNTER — Other Ambulatory Visit: Payer: Self-pay

## 2019-10-31 VITALS — BP 150/74 | HR 44 | Ht 67.0 in | Wt 118.5 lb

## 2019-10-31 DIAGNOSIS — R1310 Dysphagia, unspecified: Secondary | ICD-10-CM

## 2019-10-31 DIAGNOSIS — I2510 Atherosclerotic heart disease of native coronary artery without angina pectoris: Secondary | ICD-10-CM | POA: Diagnosis not present

## 2019-10-31 DIAGNOSIS — I272 Pulmonary hypertension, unspecified: Secondary | ICD-10-CM

## 2019-10-31 DIAGNOSIS — R030 Elevated blood-pressure reading, without diagnosis of hypertension: Secondary | ICD-10-CM

## 2019-10-31 DIAGNOSIS — E7800 Pure hypercholesterolemia, unspecified: Secondary | ICD-10-CM

## 2019-10-31 DIAGNOSIS — I493 Ventricular premature depolarization: Secondary | ICD-10-CM

## 2019-10-31 DIAGNOSIS — R0602 Shortness of breath: Secondary | ICD-10-CM

## 2019-10-31 DIAGNOSIS — R9431 Abnormal electrocardiogram [ECG] [EKG]: Secondary | ICD-10-CM | POA: Diagnosis not present

## 2019-10-31 MED ORDER — ATORVASTATIN CALCIUM 10 MG PO TABS: 10.0000 mg | ORAL_TABLET | Freq: Every day | ORAL | 2 refills | Status: DC

## 2019-10-31 MED ORDER — ASPIRIN EC 81 MG PO TBEC: 81.0000 mg | DELAYED_RELEASE_TABLET | Freq: Every day | ORAL | 3 refills | Status: DC

## 2019-10-31 NOTE — Progress Notes (Signed)
Primary Physician:  Leanna Battles, MD   Patient ID: Suzanne Leon, female    DOB: 1941/03/18, 78 y.o.   MRN: [id number]  Subjective:    Chief Complaint  Patient presents with  . Hypertension  . Shortness of Breath    HPI: Suzanne Leon  is a 78 y.o. female  HTN and HLD, not on therapy for either, was started on amlodipine, but developed severe dizziness, ARB caused fatigue. She also has chronic generalized tremors for which she takes propranolol. Continues to have difficulty in swallowing and neck discomfort (ongoing since 2017). She had also lost about 50 pounds in weight due to dysphagia.  However GI work-up and ENT w/u was unremarkable.  Her weight is now stable in the past 6 months.    She was seen by [place] on 07/23/2019 for dyspnea. At that time her shortness of breath was felt to be related to pectus excavatum, deconditioning and obesity as she has had a low risk routine treadmill stress test in 2017 and echocardiogram it only revealed mild MR and mild to moderate TR without pulmonary hypertension.  She is now referred back to [place] as she underwent CT angiogram of the chest and abdomen to evaluate for weight loss and also shortness of breath and was found to have cardiomegaly with biatrial enlargement and coronary atherosclerosis, evidence of pulmonary hypertension.   She continues to not feel well. Continues to c/o dyspnea with routine activity. No PND or orthopnea, no leg edema. Continues to state she has something hard in her throat and has difficulty with swallowing.    Past Medical History:  Diagnosis Date  . Anxiety   . Arthritis   . Chronic headaches   . Depression   . GERD (gastroesophageal reflux disease)   . HLD (hyperlipidemia)   . Internal hemorrhoids   . Osteoporosis   . Skin cancer    skin cancer    Past Surgical History:  Procedure Laterality Date  . ABDOMINAL HYSTERECTOMY    . APPENDECTOMY    . COLONOSCOPY    . ESOPHAGEAL MANOMETRY  12/18/2012   Procedure: ESOPHAGEAL MANOMETRY (EM);  Surgeon: Lafayette Dragon, MD;  Location: [facility];  Service: Endoscopy;  Laterality: N/A;  . EYE SURGERY    . LAMINECTOMY    . TONSILLECTOMY      Social History   Socioeconomic History  . Marital status: Widowed    Spouse name: Not on file  . Number of children: 1  . Years of education: Not on file  . Highest education level: Not on file  Occupational History  . Occupation: retired  Scientific laboratory technician  . Financial resource strain: Not hard at all  . Food insecurity    Worry: Not on file    Inability: Not on file  . Transportation needs    Medical: No    Non-medical: No  Tobacco Use  . Smoking status: Former Smoker    Types: Cigarettes  . Smokeless tobacco: Never Used  . Tobacco comment: when she was a teenager  Substance and Sexual Activity  . Alcohol use: No  . Drug use: No  . Sexual activity: Not on file  Lifestyle  . Physical activity    Days per week: Not on file    Minutes per session: Not on file  . Stress: Not on file  Relationships  . Social Herbalist on phone: Not on file    Gets together: Not on file  Attends religious service: Not on file    Active member of club or organization: Not on file    Attends meetings of clubs or organizations: Not on file    Relationship status: Not on file  . Intimate partner violence    Fear of current or ex partner: Not on file    Emotionally abused: Not on file    Physically abused: Not on file    Forced sexual activity: Not on file  Other Topics Concern  . Not on file  Social History Narrative   Lives alone, independent   Walks regularly, rakes    Son, Coralyn Mark lives next door   One grandchild   Attends church   Psych: denies depressed mood or anxiety    Review of Systems  Constitution: Positive for weight loss (stable over past 6 months). Negative for decreased appetite, malaise/fatigue and weight gain.  HENT: Positive for hoarse voice and sore throat (difficulty  swallowing).   Eyes: Negative for visual disturbance.  Cardiovascular: Negative for chest pain, claudication, dyspnea on exertion, leg swelling, orthopnea, palpitations and syncope.  Respiratory: Positive for shortness of breath. Negative for hemoptysis and wheezing.   Endocrine: Negative for cold intolerance and heat intolerance.  Hematologic/Lymphatic: Does not bruise/bleed easily.  Skin: Negative for nail changes.  Musculoskeletal: Negative for muscle weakness and myalgias.  Gastrointestinal: Negative for abdominal pain, change in bowel habit, nausea and vomiting.  Neurological: Negative for difficulty with concentration, dizziness, focal weakness and headaches.  Psychiatric/Behavioral: Negative for altered mental status and suicidal ideas.  All other systems reviewed and are negative.     Objective:   Vitals with BMI 10/31/2019 07/23/2019 04/02/2019  Height 5\' 7"  5\' 7"  5\' 7"   Weight 118 lbs 8 oz 114 lbs 121 lbs  BMI 18.56 [id number]  Systolic [id number]  Diastolic 74 91 60  Pulse 44 84 -    Physical Exam  Constitutional: She is oriented to person, place, and time. Vital signs are normal.  Moderately built and petite in no acute distress.  Generalized tremors and head bobbing noted.   HENT:  Head: Normocephalic and atraumatic.  Neck: Normal range of motion.  Cardiovascular: Normal rate, regular rhythm, normal heart sounds and intact distal pulses.  Pulmonary/Chest: Effort normal and breath sounds normal. No accessory muscle usage. No respiratory distress.  Abdominal: Soft. Bowel sounds are normal.  Musculoskeletal: Normal range of motion.  Neurological: She is alert and oriented to person, place, and time.  Skin: Skin is warm and dry.   Radiology: No results found.  Laboratory examination:    CMP Latest Ref Rng & Units 03/29/2019 09/26/2018  Glucose 70 - 99 mg/dL 102(H) -  BUN 8 - 23 mg/dL 18 17  Creatinine 0.44 - 1.00 mg/dL 1.18(H) 0.9  Sodium 135 - 145 mmol/L 143  143  Potassium 3.5 - 5.1 mmol/L 4.3 4.1  Chloride 98 - 111 mmol/L 106 -  CO2 22 - 32 mmol/L 26 -  Calcium 8.9 - 10.3 mg/dL 9.5 -  Alkaline Phos 25 - 125 - 72  AST 13 - 35 - 22  ALT 7 - 35 - 16   CBC Latest Ref Rng & Units 03/29/2019 09/26/2018 07/10/2009  WBC 4.0 - 10.5 K/uL 5.8 4.0 5.6  Hemoglobin 12.0 - 15.0 g/dL 12.5 11.9(A) 12.2  Hematocrit 36.0 - 46.0 % 39.2 38 36.2  Platelets 150 - 400 K/uL 160 - 162   Lipid Panel     Component Value Date/Time   CHOL  250 (A) 09/26/2018   TRIG 66 09/26/2018   HDL 79 (A) 09/26/2018   LDLCALC 158 09/26/2018   HEMOGLOBIN A1C No results found for: HGBA1C, MPG TSH Recent Labs    03/29/19 1810  TSH 2.228    PRN Meds:. Medications Discontinued During This Encounter  Medication Reason  . amLODipine (NORVASC) 5 MG tablet Error  . DUREZOL 0.05 % EMUL Error  . ketorolac (ACULAR) 0.5 % ophthalmic solution Error   Current Meds  Medication Sig  . Polyethyl Glycol-Propyl Glycol (SYSTANE OP) Apply to eye as needed.  . [DISCONTINUED] amLODipine (NORVASC) 5 MG tablet Take 1 tablet (5 mg total) by mouth daily.    Cardiac Studies:   Echocardiogram 06/29/2016: Left ventricle cavity is normal in size. Normal global wall motion. Normal diastolic filling pattern. Calculated EF 60%. Mild prolapse of the mitral valve leaflets. Mild (Grade I) mitral regurgitation. Mild to moderate tricuspid regurgitation. No evidence of pulmonary hypertension. Mild to moderate pulmonic regurgitation.  Treadmill exercise stress test 06/28/2016: Indications: Shortness of breath, Abn ECG The resting electrocardiogram demonstrated normal sinus rhythm, normal resting conduction, no resting arrhythmias and normal rest repolarization. The stress electrocardiogram was normal. Upsloping ST depression with exercise back to baseline at < 61mSec of QRS. There were no significant arrhythmias. Patient exercised on Bruce protocol for 5:00 minutes and achieved 93% of Max Predicted  HR (Target HR was >85% MPHR) and 7.02 METS. Stress symptoms included fatigue and dyspnea. Normal BP response.  Impression: Normal stress EKG. Low normal exercise capacity for age.  CT angiogram of the chest and abdomen 10/22/2019: Chest: 1. 7 mm solid spiculated nodule in the posterior segment left upper lobe. Could consider short-term interval follow-up given patient's history of prior malignancy and clinical presentation versus PET-CT or tissue sampling. 2. Cardiomegaly with biatrial enlargement. Coronary atherosclerosis. 3. Central pulmonary artery enlargement compatible with pulmonary artery hypertension. 4. Mild compressive effect upon the right heart by a pectus deformity of the chest wall. 5. Aortic Atherosclerosis (ICD10-I70.0).  Abdomen: 1. No acute intra-abdominal process . 2. Mild skin thickening and subcutaneous stranding superficial to the sacrum. Correlate with physical examination to exclude decubitus skin breakdown. 3. Paucity of intraperitoneal and subcutaneous fat compatible with reported history of weight loss.  Assessment:     ICD-10-CM   1. Coronary artery calcification seen on CAT scan  I25.10 EKG 12-Lead    PCV MYOCARDIAL PERFUSION WITH LEXISCAN    atorvastatin (LIPITOR) 10 MG tablet    aspirin EC 81 MG tablet  2. SOB (shortness of breath)  R06.02 PCV ECHOCARDIOGRAM COMPLETE    PCV MYOCARDIAL PERFUSION WITH LEXISCAN  3. Pulmonary hypertension, unspecified ([place])  I27.20 PCV ECHOCARDIOGRAM COMPLETE  4. PVC (premature ventricular contraction)  I49.3   5. Nonspecific abnormal electrocardiogram (ECG) (EKG)  R94.31 PCV MYOCARDIAL PERFUSION WITH LEXISCAN  6. Elevated BP without diagnosis of hypertension  R03.0   7. Dysphagia, unspecified type  R13.10 Ambulatory referral to Nephrology    ANA  8. Hypercholesteremia  E78.00 atorvastatin (LIPITOR) 10 MG tablet   EKG 10/31/2019: Normal sinus rhythm at the rate of 61 bpm, left atrial abnormality, left axis deviation,  left plantar fascicular block.  Incomplete right bundle branch block.  Poor R-wave progression, cannot exclude anteroseptal infarct old.  PVCs (2).  Nonspecific T normality.  Abnormal EKG. No significant change from EKG 07/23/2019.  Recommendations:   Suzanne Leon  is a 78 y.o. female  with  HTN and HLD, not on therapy for either, was started on  amlodipine, but developed severe dizziness, ARB caused fatigue. She also has chronic generalized tremors for which she takes propranolol. Continues to have difficulty in swallowing and neck discomfort (ongoing since 2017). She had also lost about 50 pounds in weight due to dysphagia.  However GI work-up and ENT w/u was unremarkable.  Her weight is now stable in the past 6 months.   She was seen by [place] on 07/23/2019 for dyspnea. At that time her shortness of breath was felt to be related to pectus excavatum, deconditioning and obesity as she has had a low risk routine treadmill stress test in 2017 and echocardiogram it only revealed mild MR and mild to moderate TR without pulmonary hypertension. Now referred back for abnormal CTA chest.   With regard to dyspnea, due to underlying coronary calcification, I have recommended repeating an echocardiogram to reevaluate both LV and RV function and also to look for pulmonary hypertension.  She'll also need a Lexiscan Myoview stress test to exclude hemodynamically significant CAD.  She needs lipid Lowering therapy, In view of her intolerance to medications, I'll start her on 10 mg of Lipitor.  Advised her to start 81 mg of aspirin, patient previously did not tolerate this but will try.  It may be worthwhile to obtain neurology consultation for dysphagia to exclude any bulbar palsy or MND (Motor neuron disease). If nuclear stress is negative, she will need right heart catheterization to evaluate for pulmonary hypertension. Check ANA for suggestion of connective tissue disease.   Her BP is elevated, she has diagnosis of  hypertension but has not been able to tolerate antihypertensives as she feels very dizzy, will continue to observe for now.   "Total time spent with patient was  40 minutes and greater than 50% of that time was spent in face to face discussion, counseling and coordination care"   Adrian Prows, MD, [facility] 11/01/2019, 8:08 AM [facility] [place] Pager: [phone] Office: [phone] If no answer Cell [phone]

## 2019-11-05 ENCOUNTER — Other Ambulatory Visit: Payer: Self-pay

## 2019-11-05 ENCOUNTER — Ambulatory Visit (INDEPENDENT_AMBULATORY_CARE_PROVIDER_SITE_OTHER): Payer: Medicare Other

## 2019-11-05 DIAGNOSIS — R0602 Shortness of breath: Secondary | ICD-10-CM

## 2019-11-05 DIAGNOSIS — R9431 Abnormal electrocardiogram [ECG] [EKG]: Secondary | ICD-10-CM

## 2019-11-05 DIAGNOSIS — I2510 Atherosclerotic heart disease of native coronary artery without angina pectoris: Secondary | ICD-10-CM | POA: Diagnosis not present

## 2019-11-06 ENCOUNTER — Encounter: Payer: Self-pay | Admitting: Neurology

## 2019-11-06 LAB — ANA: ANA Titer 1: NEGATIVE

## 2019-11-09 ENCOUNTER — Ambulatory Visit (INDEPENDENT_AMBULATORY_CARE_PROVIDER_SITE_OTHER): Payer: Medicare Other

## 2019-11-09 ENCOUNTER — Other Ambulatory Visit: Payer: Self-pay

## 2019-11-09 DIAGNOSIS — I272 Pulmonary hypertension, unspecified: Secondary | ICD-10-CM | POA: Diagnosis not present

## 2019-11-09 DIAGNOSIS — R0602 Shortness of breath: Secondary | ICD-10-CM

## 2019-12-10 ENCOUNTER — Encounter: Payer: Self-pay | Admitting: Neurology

## 2019-12-10 ENCOUNTER — Ambulatory Visit (INDEPENDENT_AMBULATORY_CARE_PROVIDER_SITE_OTHER): Payer: Medicare HMO | Admitting: Neurology

## 2019-12-10 ENCOUNTER — Other Ambulatory Visit: Payer: Self-pay

## 2019-12-10 VITALS — BP 166/82 | HR 71 | Ht 67.0 in | Wt 122.5 lb

## 2019-12-10 DIAGNOSIS — R292 Abnormal reflex: Secondary | ICD-10-CM

## 2019-12-10 DIAGNOSIS — R1310 Dysphagia, unspecified: Secondary | ICD-10-CM

## 2019-12-10 DIAGNOSIS — G250 Essential tremor: Secondary | ICD-10-CM | POA: Diagnosis not present

## 2019-12-10 NOTE — Progress Notes (Signed)
[facility] Clinic Note - Initial Visit   Date: 12/10/19  Suzanne Leon MRN: [id number] DOB: 05-16-1941   Dear Dr. Einar Gip:   Thank you for your kind referral of [facility] for consultation of dysphagia. Although her history is well known to you, please allow [place] to reiterate it for the purpose of our medical record. The patient was accompanied to the clinic by self.      History of Present Illness: Suzanne Leon is a 79 y.o. female with hypertension, hyperlipidemia, GERD, and anxiety presenting for evaluation of dysphagia. Independent history was obtained from her son, Coralyn Mark. Starting around 2018, patient began having sensation that something was stuck in the back of her throat.  She also started having spells of difficulty swallowing, especially with harder textured foods, such as meat.  She denies any problems swallowing liquids. She has previously been evaluated by ENT and GI whose work-up was negative.  She had barium swallow study in 2018 which was normal, specifically no dysmotility. She has lost about 40lb over the past 2 years, but reports very good appetite.  Her son also agrees that she eats well.   She endorses exertional shortness of breath and is being evaluated for this by cardiology. She denies orthopnea and sleeps on one pillow. No problems with double vision, slurred speech, muscle twitches, cramps, or arm/leg weakness.     Past Medical History:  Diagnosis Date  . Anxiety   . Arthritis   . Chronic headaches   . Depression   . GERD (gastroesophageal reflux disease)   . HLD (hyperlipidemia)   . Internal hemorrhoids   . Osteoporosis   . Skin cancer    skin cancer    Past Surgical History:  Procedure Laterality Date  . ABDOMINAL HYSTERECTOMY    . APPENDECTOMY    . COLONOSCOPY    . ESOPHAGEAL MANOMETRY  12/18/2012   Procedure: ESOPHAGEAL MANOMETRY (EM);  Surgeon: Lafayette Dragon, MD;  Location: [facility];  Service:  Endoscopy;  Laterality: N/A;  . EYE SURGERY    . LAMINECTOMY    . TONSILLECTOMY       Medications:  Outpatient Encounter Medications as of 12/10/2019  Medication Sig  . aspirin EC 81 MG tablet Take 1 tablet (81 mg total) by mouth daily.  Marland Kitchen atorvastatin (LIPITOR) 10 MG tablet Take 1 tablet (10 mg total) by mouth daily.  . propranolol (INDERAL) 10 MG tablet Take 10 mg by mouth daily as needed.  . [DISCONTINUED] Polyethyl Glycol-Propyl Glycol (SYSTANE OP) Apply to eye as needed.   No facility-administered encounter medications on file as of 12/10/2019.    Allergies: No Known Allergies  Family History: Family History  Problem Relation Age of Onset  . Breast cancer Daughter   . Thyroid disease Daughter   . Ulcers Father        peptic  . Throat cancer Mother 6  . Migraines Son   . Diabetes Neg Hx     Social History: Social History   Tobacco Use  . Smoking status: Former Smoker    Types: Cigarettes  . Smokeless tobacco: Never Used  . Tobacco comment: when she was a teenager  Substance Use Topics  . Alcohol use: No  . Drug use: No   Social History   Social History Narrative   Lives alone, independent   Walks regularly, rakes    Son, Coralyn Mark lives next door   One grandchild   Attends church   Psych: denies  depressed mood or anxiety    Vital Signs:  BP (!) 166/82   Pulse 71   Ht 5\' 7"  (1.702 m)   Wt 122 lb 8 oz (55.6 kg)   BMI 19.19 kg/m    General Medical Exam:   General:  Thin and frail-appearing, comfortable.   Eyes/ENT: see cranial nerve examination.   Neck:   No carotid bruits. Respiratory:  Clear to auscultation, good air entry bilaterally.   Cardiac:  Regular rate and rhythm, no murmur.   Extremities:  No deformities, edema, or skin discoloration.  Skin:  No rashes or lesions.  Neurological Exam: MENTAL STATUS including orientation to time, place, person, recent and remote memory, attention span and concentration, language, and fund of knowledge is  normal.  Speech is not dysarthric.  CRANIAL NERVES: II:  No visual field defects.  III-IV-VI: Pupils equal round and reactive to light.  Normal conjugate, extra-ocular eye movements in all directions of gaze.  No nystagmus.  No ptosis.   V:  Normal facial sensation.    VII:  Normal facial symmetry and movements.  Orbicularis oris, orbicularis oculi and buccinator is 5/5. VIII:  Normal hearing and vestibular function.   IX-X:  Normal palatal movement.   XI:  Normal shoulder shrug and head rotation.   XII:  Normal tongue strength (5/5) and range of motion, no deviation or fasciculation.  MOTOR:  Generalized loss of muscle bulk throughout.  No focal atrophy, fasciculations.  Side to side head tremor in moderate amplitude and rare is present at rest and with activity. No pronator drift.   Upper Extremity:  Right  Left  Deltoid  5/5   5/5   Biceps  5/5   5/5   Triceps  5/5   5/5   Infraspinatus 5/5  5/5  Medial pectoralis 5/5  5/5  Wrist extensors  5/5   5/5   Wrist flexors  5/5   5/5   Finger extensors  5/5   5/5   Finger flexors  5/5   5/5   Dorsal interossei  5/5   5/5   Abductor pollicis  5/5   5/5   Tone (Ashworth scale)  0  0   Lower Extremity:  Right  Left  Hip flexors  5/5   5/5   Hip extensors  5/5   5/5   Adductor 5/5  5/5  Abductor 5/5  5/5  Knee flexors  5/5   5/5   Knee extensors  5/5   5/5   Dorsiflexors  5/5   5/5   Plantarflexors  5/5   5/5   Toe extensors  5/5   5/5   Toe flexors  5/5   5/5   Tone (Ashworth scale)  0  0   MSRs:  Right        Left                  brachioradialis 2+  2+  biceps 2+  2+  triceps 2+  2+  patellar 3+  3+  ankle jerk 2+  2+  Hoffman no  no  plantar response down  down   SENSORY:  Normal and symmetric perception of light touch, pinprick, vibration, and proprioception.    COORDINATION/GAIT: Normal finger-to- nose-finger.  Intact rapid alternating movements bilaterally.   Gait narrow based and stable. Tandem and stressed gait  intact.    IMPRESSION: 1.  Dysphagia, globus sensation, with associated 40lb weight loss.  Normal neuromuscular exam without features to  suggest motor neuron disease, myasthenia gravis, or parkinson's disease. Given that her prior work-up has been nondiagnostic, I will proceed with testing to exclude any primary neurological cause for her dysphagia.  2.  Benign essential tremor involving the head.  Patient is not bothered by symptoms and takes propranolol 10mg  only as needed.  Discussed that this is a very low dose and we can certainly adjust dose as needed, but that we treat tremor when it is interfering when patient's quality of life. She is happy with using low dose propranolol on as needed.    PLAN/RECOMMENDATIONS:  NCS/EMG of the left arm and leg MRI brain wo contrast MRI cervical spine wo contrast  Further recommendations pending results.   Thank you for allowing me to participate in patient's care.  If I can answer any additional questions, I would be pleased to do so.    Sincerely,    Faustino Luecke K. Posey Pronto, DO

## 2019-12-10 NOTE — Patient Instructions (Addendum)
Nerve testing of the left arm and leg MRI brain and cervical spine    ELECTROMYOGRAM AND NERVE CONDUCTION STUDIES (EMG/NCS) INSTRUCTIONS  How to Prepare The neurologist conducting the EMG will need to know if you have certain medical conditions. Tell the neurologist and other EMG lab personnel if you: . Have a pacemaker or any other electrical medical device . Take blood-thinning medications . Have hemophilia, a blood-clotting disorder that causes prolonged bleeding Bathing Take a shower or bath shortly before your exam in order to remove oils from your skin. Don't apply lotions or creams before the exam.  What to Expect You'll likely be asked to change into a hospital gown for the procedure and lie down on an examination table. The following explanations can help you understand what will happen during the exam.  . Electrodes. The neurologist or a technician places surface electrodes at various locations on your skin depending on where you're experiencing symptoms. Or the neurologist may insert needle electrodes at different sites depending on your symptoms.  . Sensations. The electrodes will at times transmit a tiny electrical current that you may feel as a twinge or spasm. The needle electrode may cause discomfort or pain that usually ends shortly after the needle is removed. If you are concerned about discomfort or pain, you may want to talk to the neurologist about taking a short break during the exam.  . Instructions. During the needle EMG, the neurologist will assess whether there is any spontaneous electrical activity when the muscle is at rest - activity that isn't present in healthy muscle tissue - and the degree of activity when you slightly contract the muscle.  He or she will give you instructions on resting and contracting a muscle at appropriate times. Depending on what muscles and nerves the neurologist is examining, he or she may ask you to change positions during the exam.  After  your EMG You may experience some temporary, minor bruising where the needle electrode was inserted into your muscle. This bruising should fade within several days. If it persists, contact your primary care doctor.

## 2019-12-12 ENCOUNTER — Other Ambulatory Visit: Payer: Self-pay

## 2019-12-12 ENCOUNTER — Ambulatory Visit: Payer: Medicare HMO | Admitting: Cardiology

## 2019-12-12 ENCOUNTER — Encounter: Payer: Self-pay | Admitting: Cardiology

## 2019-12-12 VITALS — BP 133/68 | HR 62 | Temp 98.1°F | Resp 16 | Ht 67.0 in | Wt 123.4 lb

## 2019-12-12 DIAGNOSIS — I10 Essential (primary) hypertension: Secondary | ICD-10-CM

## 2019-12-12 DIAGNOSIS — E7800 Pure hypercholesterolemia, unspecified: Secondary | ICD-10-CM | POA: Diagnosis not present

## 2019-12-12 DIAGNOSIS — R0602 Shortness of breath: Secondary | ICD-10-CM

## 2019-12-12 DIAGNOSIS — I340 Nonrheumatic mitral (valve) insufficiency: Secondary | ICD-10-CM | POA: Diagnosis not present

## 2019-12-12 DIAGNOSIS — I2721 Secondary pulmonary arterial hypertension: Secondary | ICD-10-CM

## 2019-12-12 DIAGNOSIS — I2510 Atherosclerotic heart disease of native coronary artery without angina pectoris: Secondary | ICD-10-CM | POA: Diagnosis not present

## 2019-12-12 MED ORDER — ATORVASTATIN CALCIUM 10 MG PO TABS: 10.0000 mg | ORAL_TABLET | Freq: Every day | ORAL | 3 refills | Status: DC

## 2019-12-12 NOTE — Progress Notes (Signed)
Primary Physician:  Leanna Battles, MD   Patient ID: Suzanne Leon, female    DOB: [birth year], 79 y.o.   MRN: [id number]  Subjective:    Chief Complaint  Patient presents with  . Coronary Artery Disease  . Shortness of Breath  . Results    Echocardiogram and Stress    HPI: Suzanne Leon  is a 79 y.o. female  HTN and HLD, not on therapy for either, was started on amlodipine, but developed severe dizziness, ARB caused fatigue. She also has chronic generalized tremors for which she takes propranolol. Continues to have difficulty in swallowing and neck discomfort (ongoing since 2017). She had also lost about 50 pounds in weight due to dysphagia.  However GI work-up and ENT w/u was unremarkable.  Her weight is now stable in the past 6 months. Continues to state she has something hard in her throat and has difficulty with swallowing.  She was seen by [place] on 07/23/2019 for dyspnea. At that time her shortness of breath was felt to be related to pectus excavatum, deconditioning and obesity. I am seeing her back for Fatigue and dyspnea with exertion are stable. No PND or orthopnea, no leg edema.   Underwent CT angiogram of the chest and abdomen to evaluate for weight loss and also shortness of breath and was found to have cardiomegaly with biatrial enlargement and coronary atherosclerosis, evidence of pulmonary hypertension. Now presents for f/u. Tolerating Atorvastatin well. No new symptoms. Underwent echo and stress test.  Past Medical History:  Diagnosis Date  . Anxiety   . Arthritis   . Chronic headaches   . Depression   . GERD (gastroesophageal reflux disease)   . HLD (hyperlipidemia)   . Internal hemorrhoids   . Osteoporosis   . Skin cancer    skin cancer    Past Surgical History:  Procedure Laterality Date  . ABDOMINAL HYSTERECTOMY    . APPENDECTOMY    . COLONOSCOPY    . ESOPHAGEAL MANOMETRY  12/18/2012   Procedure: ESOPHAGEAL MANOMETRY (EM);  Surgeon: Lafayette Dragon,  MD;  Location: [facility];  Service: Endoscopy;  Laterality: N/A;  . EYE SURGERY    . LAMINECTOMY    . TONSILLECTOMY      Social History   Socioeconomic History  . Marital status: Widowed    Spouse name: Not on file  . Number of children: 2  . Years of education: Not on file  . Highest education level: Not on file  Occupational History  . Occupation: retired  Tobacco Use  . Smoking status: Former Smoker    Types: Cigarettes  . Smokeless tobacco: Never Used  . Tobacco comment: when she was a teenager  Substance and Sexual Activity  . Alcohol use: No  . Drug use: No  . Sexual activity: Not on file  Other Topics Concern  . Not on file  Social History Narrative   Lives alone, independent   Walks regularly, rakes    Son, Coralyn Mark lives next door   One grandchild   Attends church   Psych: denies depressed mood or anxiety   Social Determinants of Radio broadcast assistant Strain:   . Difficulty of Paying Living Expenses: Not on file  Food Insecurity:   . Worried About Charity fundraiser in the Last Year: Not on file  . Ran Out of Food in the Last Year: Not on file  Transportation Needs:   . Lack of Transportation (Medical): Not on file  .  Lack of Transportation (Non-Medical): Not on file  Physical Activity:   . Days of Exercise per Week: Not on file  . Minutes of Exercise per Session: Not on file  Stress:   . Feeling of Stress : Not on file  Social Connections:   . Frequency of Communication with Friends and Family: Not on file  . Frequency of Social Gatherings with Friends and Family: Not on file  . Attends Religious Services: Not on file  . Active Member of Clubs or Organizations: Not on file  . Attends Archivist Meetings: Not on file  . Marital Status: Not on file  Intimate Partner Violence:   . Fear of Current or Ex-Partner: Not on file  . Emotionally Abused: Not on file  . Physically Abused: Not on file  . Sexually Abused: Not on file     Review of Systems  Constitution: Positive for weight loss (stable over past 6 months). Negative for decreased appetite, malaise/fatigue and weight gain.  HENT: Positive for hoarse voice and sore throat (difficulty swallowing).   Eyes: Negative for visual disturbance.  Cardiovascular: Negative for chest pain, claudication, dyspnea on exertion, leg swelling, orthopnea, palpitations and syncope.  Respiratory: Positive for shortness of breath. Negative for hemoptysis and wheezing.   Endocrine: Negative for cold intolerance and heat intolerance.  Hematologic/Lymphatic: Does not bruise/bleed easily.  Skin: Negative for nail changes.  Musculoskeletal: Negative for muscle weakness and myalgias.  Gastrointestinal: Negative for abdominal pain, change in bowel habit, nausea and vomiting.  Neurological: Negative for difficulty with concentration, dizziness, focal weakness and headaches.  Psychiatric/Behavioral: Negative for altered mental status and suicidal ideas.  All other systems reviewed and are negative.     Objective:   Vitals with BMI 12/12/2019 12/10/2019 10/31/2019  Height 5' 7"  5' 7"  5' 7"   Weight 123 lbs 6 oz 122 lbs 8 oz 118 lbs 8 oz  BMI 19.32 29.56 21.30  Systolic 865 784 696  Diastolic 68 82 74  Pulse 62 71 44    Physical Exam  Constitutional: She is oriented to person, place, and time. Vital signs are normal.  Moderately built and petite in no acute distress.  Generalized tremors and head bobbing noted.   HENT:  Head: Normocephalic and atraumatic.  Cardiovascular: Normal rate, regular rhythm, normal heart sounds and intact distal pulses.  Pulmonary/Chest: Effort normal and breath sounds normal. No accessory muscle usage. No respiratory distress.  Abdominal: Soft. Bowel sounds are normal.  Musculoskeletal:        General: Normal range of motion.     Cervical back: Normal range of motion.  Neurological: She is alert and oriented to person, place, and time.  Skin: Skin is  warm and dry.   Radiology: No results found.  Laboratory examination:    CMP Latest Ref Rng & Units 03/29/2019 09/26/2018  Glucose 70 - 99 mg/dL 102(H) -  BUN 8 - 23 mg/dL 18 17  Creatinine 0.44 - 1.00 mg/dL 1.18(H) 0.9  Sodium 135 - 145 mmol/L 143 143  Potassium 3.5 - 5.1 mmol/L 4.3 4.1  Chloride 98 - 111 mmol/L 106 -  CO2 22 - 32 mmol/L 26 -  Calcium 8.9 - 10.3 mg/dL 9.5 -  Alkaline Phos 25 - 125 - 72  AST 13 - 35 - 22  ALT 7 - 35 - 16   CBC Latest Ref Rng & Units 03/29/2019 09/26/2018 07/10/2009  WBC 4.0 - 10.5 K/uL 5.8 4.0 5.6  Hemoglobin 12.0 - 15.0 g/dL 12.5 11.9(A)  12.2  Hematocrit 36.0 - 46.0 % 39.2 38 36.2  Platelets 150 - 400 K/uL 160 - 162   Lipid Panel     Component Value Date/Time   CHOL 250 (A) 09/26/2018 0000   TRIG 66 09/26/2018 0000   HDL 79 (A) 09/26/2018 0000   LDLCALC 158 09/26/2018 0000   HEMOGLOBIN A1C No results found for: HGBA1C, MPG TSH Recent Labs    03/29/19 1810  TSH 2.228    PRN Meds:. Medications Discontinued During This Encounter  Medication Reason  . atorvastatin (LIPITOR) 10 MG tablet Reorder   Current Meds  Medication Sig  . aspirin EC 81 MG tablet Take 1 tablet (81 mg total) by mouth daily.  . propranolol (INDERAL) 10 MG tablet Take 10 mg by mouth daily as needed.  . [DISCONTINUED] atorvastatin (LIPITOR) 10 MG tablet Take 1 tablet (10 mg total) by mouth daily.  Marland Kitchen atorvastatin (LIPITOR) 10 MG tablet Take 1 tablet (10 mg total) by mouth daily.    Cardiac Studies:   Treadmill exercise stress test 06/28/2016: Indications: Shortness of breath, Abn ECG The resting electrocardiogram demonstrated normal sinus rhythm, normal resting conduction, no resting arrhythmias and normal rest repolarization. The stress electrocardiogram was normal. Upsloping ST depression with exercise back to baseline at < 49mec of QRS. There were no significant arrhythmias. Patient exercised on Bruce protocol for 5:00 minutes and achieved 93% of Max  Predicted HR (Target HR was >85% MPHR) and 7.02 METS. Stress symptoms included fatigue and dyspnea. Normal BP response.  Impression: Normal stress EKG. Low normal exercise capacity for age.  CT angiogram of the chest and abdomen 10/22/2019: Chest: 1. 7 mm solid spiculated nodule in the posterior segment left upper lobe. Could consider short-term interval follow-up given patient's history of prior malignancy and clinical presentation versus PET-CT or tissue sampling. 2. Cardiomegaly with biatrial enlargement. Coronary atherosclerosis. 3. Central pulmonary artery enlargement compatible with pulmonary artery hypertension. 4. Mild compressive effect upon the right heart by a pectus deformity of the chest wall. 5. Aortic Atherosclerosis (ICD10-I70.0). Abdomen: 1. No acute intra-abdominal process . 2. Mild skin thickening and subcutaneous stranding superficial to the sacrum. Correlate with physical examination to exclude decubitus skin breakdown. 3. Paucity of intraperitoneal and subcutaneous fat compatible with reported history of weight loss.  Echocardiogram 11/09/2019: Left ventricle cavity is normal in size. Normal left ventricular wall thickness. Normal LV systolic function with EF 58%. Normal global wall motion. Normal diastolic filling pattern.  Left atrial cavity is mildly dilated. Trileaflet aortic valve.  Mild aortic regurgitation. Moderate (Grade II) mitral regurgitation. Mild prolapse of the mitral valve leaflets. Moderate tricuspid regurgitation. Moderate pulmonary hypertension. Estimated pulmonary artery systolic pressure is 45 mmHg.  Mild pulmonic regurgitation. IVC is dilated with blunted respiratory response. Estimated RA pressure 10-15 mmHg. Compared to previous study, mild aortic regurgitation, elevated right atrial pressure and moderate pulmonary hypertension are new findings.   Lexiscan Tetrofosmin stress test 11/05/2019: No previous exam available for comparison. Lexiscan  nuclear stress test performed using 1-day protocol. Myocardial perfusion imaging is normal. Left ventricular ejection fraction is  59% with normal wall motion. Low risk study.  Assessment:     ICD-10-CM   1. SOB (shortness of breath)  R06.02   2. Hypercholesteremia  E78.00 atorvastatin (LIPITOR) 10 MG tablet    Lipid Panel With LDL/HDL Ratio  3. Essential hypertension  I10 PCV ECHOCARDIOGRAM COMPLETE    CMP14+EGFR  4. Moderate mitral regurgitation  I34.0 PCV ECHOCARDIOGRAM COMPLETE  5. Moderate pulmonary arterial systolic  hypertension (HCC)  (PH)  I27.21 PCV ECHOCARDIOGRAM COMPLETE  6. Coronary artery calcification seen on CAT scan  I25.10 atorvastatin (LIPITOR) 10 MG tablet    Lipid Panel With LDL/HDL Ratio   EKG 10/31/2019: Normal sinus rhythm at the rate of 61 bpm, left atrial abnormality, left axis deviation, left plantar fascicular block.  Incomplete right bundle branch block.  Poor R-wave progression, cannot exclude anteroseptal infarct old.  PVCs (2).  Nonspecific T normality.  Abnormal EKG. No significant change from EKG 07/23/2019.  Recommendations:   Suzanne Leon  is a 79 y.o. female  with  HTN and HLD, not on therapy for either, was started on amlodipine, but developed severe dizziness, ARB caused fatigue. She also has chronic generalized tremors and head bobbing for which she takes propranolol. Continues to have difficulty in swallowing and neck discomfort (ongoing since 2017) and now being evaluated by Dr. Narda Amber (Neuro). She had also lost about 50 pounds in weight due to dysphagia.  However GI work-up and ENT w/u was unremarkable.  Her weight is now stable in the past 6 months.   She was seen by [place] on 07/23/2019 for dyspnea. At that time her shortness of breath was felt to be related to pectus excavatum, deconditioning and obesity, review of echo although has multivalvular heart disease and moderate pulmonary hypertension, should not cause such profound dyspnea without  CHF findings. Hence still feel dyspnea is non cardiac in view of normal stress test. PH is probably related to prior obesity. Could consider right heart catheterization to exclude primary pulmonary hypertension. No E/O RV failure on echo which is reassuring. Will repeat echo in 6 months and see her back. Her main concern continues to be fatigue and difficulty in swallowing. We could consider Sleep evaluation.   She was started on 10 mg of Lipitor and 81 mg of aspirin on her last OV, she is tolerating this well, continue the same and will check lipids in 6 months. Her BP is normal today and was elevated mildly last OV, she has diagnosis of hypertension but has not been able to tolerate antihypertensives as she feels very dizzy. No indication for starting meds now and suspect BP has improved with weight loss.    Adrian Prows, MD, [facility] 12/12/2019, 12:39 PM [facility] [place] Office: [phone]  CC: Narda Amber, DO (Neuro); PCP Dr. Janie Morning

## 2019-12-12 NOTE — Patient Instructions (Signed)
Please get labs drawn at [facility] in last week May 2021. You will be scheduled for echocardiogram in 6 months and Dr. Einar Gip will see you back after the echocardiogram and labs.

## 2019-12-20 ENCOUNTER — Other Ambulatory Visit: Payer: Medicare HMO

## 2019-12-25 ENCOUNTER — Encounter: Payer: Medicare HMO | Admitting: Neurology

## 2019-12-26 ENCOUNTER — Telehealth: Payer: Self-pay | Admitting: Neurology

## 2019-12-26 ENCOUNTER — Telehealth: Payer: Self-pay

## 2019-12-26 NOTE — Telephone Encounter (Signed)
I see your message from 318pm but after you have placed that it is approved another message is sent stating: 1/20:  auths pending, pls do not r/s wo auths //jtb   Patient is calling concerned about this and they canceled the appointment. Can you assist in getting the authorization to the facility to be able to complete patients scan

## 2019-12-26 NOTE — Telephone Encounter (Signed)
-----   Message from Sheffield Slider sent at 12/26/2019  4:13 PM EST ----- Auth # [id number] valid until 02/21 ----- Message ----- From: Wilder Glade, LPN Sent: [id number]   3:30 PM EST To: Sheffield Slider   ----- Message ----- From: Alda Berthold, DO Sent: 12/26/2019   3:26 PM EST To: Wilder Glade, LPN  [employer]ref[employer] should get it approved.  ----- Message ----- From: Wilder Glade, LPN Sent: [id number]   3:12 PM EST To: Alda Berthold, DO  What other DX can we use for the MRI of the cervical spine?  ----- Message ----- From: Ronalee Red I Sent: 12/26/2019   3:07 PM EST To: Wilder Glade, LPN  Fabio Pierce this pt is scheduled for tomorrow for MRI Cervical Spine and Brain  MRI of the brain was approved but MRI cervical spine is in review. They state the tremors is not reason for MRI cervical spine.

## 2019-12-26 NOTE — Telephone Encounter (Signed)
Mri of cervical spine Auth # [id number] valid until 02/21

## 2019-12-26 NOTE — Telephone Encounter (Signed)
Patient's son called and said the patient had to cancel her MRI tomorrow due to it not being authorized yet. He'd like to know whether she needs to keep her EMG appointment on 01/02/20 since she will not have had the MRI yet.

## 2019-12-27 ENCOUNTER — Other Ambulatory Visit: Payer: Medicare HMO

## 2020-01-02 ENCOUNTER — Encounter: Payer: Medicare HMO | Admitting: Neurology

## 2020-01-03 ENCOUNTER — Ambulatory Visit
Admission: RE | Admit: 2020-01-03 | Discharge: 2020-01-03 | Disposition: A | Discharge status: Home / Self Care | Payer: Medicare HMO | Source: Ambulatory Visit | Attending: Neurology | Admitting: Neurology

## 2020-01-03 ENCOUNTER — Telehealth: Payer: Self-pay | Admitting: Neurology

## 2020-01-03 ENCOUNTER — Other Ambulatory Visit: Payer: Self-pay

## 2020-01-03 DIAGNOSIS — R1310 Dysphagia, unspecified: Secondary | ICD-10-CM

## 2020-01-03 DIAGNOSIS — G250 Essential tremor: Secondary | ICD-10-CM

## 2020-01-03 DIAGNOSIS — R251 Tremor, unspecified: Secondary | ICD-10-CM | POA: Diagnosis not present

## 2020-01-03 DIAGNOSIS — M50222 Other cervical disc displacement at C5-C6 level: Secondary | ICD-10-CM | POA: Diagnosis not present

## 2020-01-03 NOTE — Telephone Encounter (Signed)
Called patient's son regarding results of MRI brain and cervical spine, which does not show any structural pathology for her dysphagia.  She has age-related changes in the brain with small vessel disease and small, old right cerebellar stroke which would not explain symptoms. Degenerative changes in the cervical spine, worse at C5-6 where there is right foraminal narrowing.  She does not have radicular symptoms.    NCS/EMG is scheduled for next week.

## 2020-01-08 ENCOUNTER — Other Ambulatory Visit: Payer: Self-pay

## 2020-01-08 ENCOUNTER — Ambulatory Visit (INDEPENDENT_AMBULATORY_CARE_PROVIDER_SITE_OTHER): Payer: Medicare HMO | Admitting: Neurology

## 2020-01-08 DIAGNOSIS — G250 Essential tremor: Secondary | ICD-10-CM | POA: Diagnosis not present

## 2020-01-08 DIAGNOSIS — R531 Weakness: Secondary | ICD-10-CM | POA: Diagnosis not present

## 2020-01-08 DIAGNOSIS — R1310 Dysphagia, unspecified: Secondary | ICD-10-CM | POA: Diagnosis not present

## 2020-01-08 DIAGNOSIS — R292 Abnormal reflex: Secondary | ICD-10-CM | POA: Diagnosis not present

## 2020-01-08 NOTE — Progress Notes (Signed)
    Follow-up Visit   Date: 01/08/20   Suzanne Leon MRN: [id number] DOB: [birth year]   Interim History: Suzanne Leon is a 79 y.o. female with hypertension, hyperlipidemia, GERD, and anxiety returning to the clinic for follow-up of dysphagia.  The patient was accompanied to the clinic by self.  To investigate her dysphagia, she is here for electrodiagnostic testing.  Last week, she underwent MRI brain and cervical spine. Imaging of the brain shows age-related changes and old right cerebellar stroke, which would not manifest with dysphagia.  Cervical spine imaging shows degenerative changes, worse at C5-6 where there is right foraminal narrowing.  She continues to have sensation of fullness in the throat as if she is unable to swallow.  Today, she also complains of right jaw pain and feels that her right ear is larger/fuller than the left.   Data: NCS/EMG of the left side 01/08/2020:  This is a normal study.  In particular, there is no evidence of a widespread disorder of anterior horn cells, diffuse myopathy, cervical/lumbosacral radiculopathy, or sensorimotor polyneuropathy.  MRI brain 01/03/2020: No acute or reversible finding. Old small vessel infarction in the right cerebellum. Moderate changes of chronic small vessel disease of the cerebral hemispheric white matter.  MRI cervical spine wo contrast 01/03/2020: 1. No finding to explain dysphagia. 2. right paracentral protrusion and uncovertebral disease at C5-6 cause moderate foraminal narrowing right foraminal narrowing. Theprotrusion could impact the right C6 root as it enters the foramen. The central canal is open. 3. Mild-to-moderate facet arthropathy C4-5 is worse on the right.  IMPRESSION/PLAN: Globus sensation with dysphagia and associated weight loss of unclear etiology.  Extensive neurological evaluation with MRI bran, MRI cervical spine, and NCS/EMG does not show neurological basis for her symptoms.  Specifically, no  evidence of motor neuron disease (ALS) or myopathy. She has a small old right cerebellar stroke, which does not manifest with these symptoms.  Imaging of the cervical spine shows foraminal narrowing at the right C5-6 level, but she denies radicular symptoms or weakness involving this myotome. If she develops new neck pain, arm paresthesias, or weakness, neck physiotherapy can be considered.    Benign essential tremor of the head.  She takes very low dose propranolol 10mg  as needed and is happy at her current dose. No evidence of parkinson's based on her last visit exam.   For her ongoing dysphagia, she may want to see GI again as she tells me she feels like she needs to be "stretched".  I will defer this to her PCP.  Return to clinic as needed  Thank you for allowing me to participate in patient's care.  If I can answer any additional questions, I would be pleased to do so.    Sincerely,    Donika K. Posey Pronto, DO

## 2020-01-08 NOTE — Procedures (Signed)
[facility]  [place], [place]  [place], [place] [address] Tel: [phone] Fax:  [phone] Test Date:  01/08/2020  Patient: Suzanne Leon DOB: [birth year] Physician: Narda Amber, DO  Sex: Female Height: 5\' 7"  Ref Phys: Narda Amber, DO  ID#: [id number] Temp: 32.0C Technician:    Patient Complaints: This is a 79 year old female referred for evaluation of dysphagia.  NCV & EMG Findings: Extensive electrodiagnostic testing of left upper, left lower, and bulbar muscles shows:  1. All sensory responses including the left median, ulnar, sural, and superficial peroneal nerve is within normal limits. 2. All motor responses including the left median, ulnar, peroneal, and tibial nerves are within normal limits. 3. Left tibial H reflex study is within normal limits. 4. There is no evidence of active or chronic motor axonal loss changes affecting any of the tested muscles involving the bulbar, cervical, and lumbosacral regions.  Specifically, there is no evidence of fasciculation potentials or small, complex motor unit.  Impression: This is a normal study.  In particular, there is no evidence of a widespread disorder of anterior horn cells, diffuse myopathy, cervical/lumbosacral radiculopathy, or sensorimotor polyneuropathy.   ___________________________ Narda Amber, DO    Nerve Conduction Studies Anti Sensory Summary Table   Site NR Peak (ms) Norm Peak (ms) P-T Amp (V) Norm P-T Amp  Left Median Anti Sensory (2nd Digit)  32C  Wrist    3.5 <3.8 27.5 >10  Left Sup Peroneal Anti Sensory (Ant Lat Mall)  32C  12 cm    2.7 <4.6 7.6 >3  Left Sural Anti Sensory (Lat Mall)  32C  Calf    4.0 <4.6 8.5 >3  Left Ulnar Anti Sensory (5th Digit)  32C  Wrist    3.1 <3.2 23.2 >5   Motor Summary Table   Site NR Onset (ms) Norm Onset (ms) O-P Amp (mV) Norm O-P Amp Site1 Site2 Delta-0 (ms) Dist (cm) Vel (m/s) Norm Vel (m/s)  Left Median Motor (Abd Poll Brev)  32C  Wrist     3.4 <4.0 9.2 >5 Elbow Wrist 5.1 31.0 61 >50  Elbow    8.5  8.5         Left Peroneal Motor (Ext Dig Brev)  32C  Ankle    4.3 <6.0 5.2 >2.5 B Fib Ankle 7.6 37.0 49 >40  B Fib    11.9  4.8  Poplt B Fib 1.9 8.0 42 >40  Poplt    13.8  4.3         Left Peroneal TA Motor (Tib Ant)  32C  Fib Head    2.7 <4.5 4.3 >3 Poplit Fib Head 1.4 8.0 57 >40  Poplit    4.1  4.2         Left Tibial Motor (Abd Hall Brev)  32C  Ankle    5.3 <6.0 10.2 >4 Knee Ankle 9.2 42.0 46 >40  Knee    14.5  8.0         Left Ulnar Motor (Abd Dig Minimi)  32C  Wrist    2.7 <3.1 10.5 >7 B Elbow Wrist 4.3 23.0 53 >50  B Elbow    7.0  9.1  A Elbow B Elbow 1.7 10.0 59 >50  A Elbow    8.7  8.6          H Reflex Studies   NR H-Lat (ms) Lat Norm (ms) L-R H-Lat (ms)  Left Tibial (Gastroc)  32C     34.29 <35  EMG   Side Muscle Ins Act Fibs Psw Fasc Number Recrt Dur Dur. Amp Amp. Poly Poly. Comment  Left 1stDorInt Nml Nml Nml Nml Nml Nml Nml Nml Nml Nml Nml Nml N/A  Left PronatorTeres Nml Nml Nml Nml Nml Nml Nml Nml Nml Nml Nml Nml N/A  Left Biceps Nml Nml Nml Nml Nml Nml Nml Nml Nml Nml Nml Nml N/A  Left Triceps Nml Nml Nml Nml Nml Nml Nml Nml Nml Nml Nml Nml N/A  Left Deltoid Nml Nml Nml Nml Nml Nml Nml Nml Nml Nml Nml Nml N/A  Left AntTibialis Nml Nml Nml Nml Nml Nml Nml Nml Nml Nml Nml Nml N/A  Left Gastroc Nml Nml Nml Nml Nml Nml Nml Nml Nml Nml Nml Nml N/A  Left Flex Dig Long Nml Nml Nml Nml Nml Nml Nml Nml Nml Nml Nml Nml N/A  Left RectFemoris Nml Nml Nml Nml Nml Nml Nml Nml Nml Nml Nml Nml N/A  Left GluteusMed Nml Nml Nml Nml Nml Nml Nml Nml Nml Nml Nml Nml N/A  Left Hyoglossus Nml Nml Nml Nml Nml Nml Nml Nml Nml Nml Nml Nml N/A      Waveforms:

## 2020-02-04 ENCOUNTER — Other Ambulatory Visit: Payer: Self-pay | Admitting: Cardiology

## 2020-04-11 ENCOUNTER — Other Ambulatory Visit: Payer: Self-pay | Admitting: Internal Medicine

## 2020-04-11 DIAGNOSIS — I272 Pulmonary hypertension, unspecified: Secondary | ICD-10-CM | POA: Diagnosis not present

## 2020-04-11 DIAGNOSIS — R1310 Dysphagia, unspecified: Secondary | ICD-10-CM | POA: Diagnosis not present

## 2020-04-11 DIAGNOSIS — Z1331 Encounter for screening for depression: Secondary | ICD-10-CM | POA: Diagnosis not present

## 2020-04-11 DIAGNOSIS — G4700 Insomnia, unspecified: Secondary | ICD-10-CM | POA: Diagnosis not present

## 2020-04-11 DIAGNOSIS — R918 Other nonspecific abnormal finding of lung field: Secondary | ICD-10-CM | POA: Diagnosis not present

## 2020-04-14 ENCOUNTER — Other Ambulatory Visit: Payer: Self-pay | Admitting: Internal Medicine

## 2020-04-14 DIAGNOSIS — R911 Solitary pulmonary nodule: Secondary | ICD-10-CM

## 2020-04-14 DIAGNOSIS — R1310 Dysphagia, unspecified: Secondary | ICD-10-CM

## 2020-04-25 ENCOUNTER — Ambulatory Visit
Admission: RE | Admit: 2020-04-25 | Discharge: 2020-04-25 | Disposition: A | Discharge status: Home / Self Care | Payer: Medicare HMO | Source: Ambulatory Visit | Attending: Internal Medicine | Admitting: Internal Medicine

## 2020-04-25 ENCOUNTER — Other Ambulatory Visit: Payer: Self-pay | Admitting: Internal Medicine

## 2020-04-25 DIAGNOSIS — R911 Solitary pulmonary nodule: Secondary | ICD-10-CM

## 2020-04-25 DIAGNOSIS — R1310 Dysphagia, unspecified: Secondary | ICD-10-CM

## 2020-04-29 ENCOUNTER — Institutional Professional Consult (permissible substitution): Payer: Self-pay | Admitting: Neurology

## 2020-05-13 DIAGNOSIS — Z961 Presence of intraocular lens: Secondary | ICD-10-CM | POA: Diagnosis not present

## 2020-06-02 ENCOUNTER — Other Ambulatory Visit: Payer: Medicare HMO

## 2020-06-13 ENCOUNTER — Ambulatory Visit: Payer: Medicare HMO | Admitting: Cardiology

## 2020-06-18 DIAGNOSIS — E785 Hyperlipidemia, unspecified: Secondary | ICD-10-CM | POA: Diagnosis not present

## 2020-06-18 DIAGNOSIS — I10 Essential (primary) hypertension: Secondary | ICD-10-CM | POA: Diagnosis not present

## 2020-06-18 DIAGNOSIS — I119 Hypertensive heart disease without heart failure: Secondary | ICD-10-CM | POA: Diagnosis not present

## 2020-06-18 DIAGNOSIS — E871 Hypo-osmolality and hyponatremia: Secondary | ICD-10-CM | POA: Diagnosis not present

## 2020-06-18 DIAGNOSIS — R55 Syncope and collapse: Secondary | ICD-10-CM | POA: Diagnosis not present

## 2020-06-18 DIAGNOSIS — E039 Hypothyroidism, unspecified: Secondary | ICD-10-CM | POA: Diagnosis not present

## 2020-06-18 DIAGNOSIS — R42 Dizziness and giddiness: Secondary | ICD-10-CM | POA: Diagnosis not present

## 2020-06-18 DIAGNOSIS — R9431 Abnormal electrocardiogram [ECG] [EKG]: Secondary | ICD-10-CM | POA: Diagnosis not present

## 2020-10-16 DIAGNOSIS — M810 Age-related osteoporosis without current pathological fracture: Secondary | ICD-10-CM | POA: Diagnosis not present

## 2020-10-16 DIAGNOSIS — E039 Hypothyroidism, unspecified: Secondary | ICD-10-CM | POA: Diagnosis not present

## 2020-10-16 DIAGNOSIS — E785 Hyperlipidemia, unspecified: Secondary | ICD-10-CM | POA: Diagnosis not present

## 2020-10-23 DIAGNOSIS — Z0000 Encounter for general adult medical examination without abnormal findings: Secondary | ICD-10-CM | POA: Diagnosis not present

## 2020-10-23 DIAGNOSIS — I2510 Atherosclerotic heart disease of native coronary artery without angina pectoris: Secondary | ICD-10-CM | POA: Diagnosis not present

## 2020-10-23 DIAGNOSIS — M810 Age-related osteoporosis without current pathological fracture: Secondary | ICD-10-CM | POA: Diagnosis not present

## 2020-10-23 DIAGNOSIS — F419 Anxiety disorder, unspecified: Secondary | ICD-10-CM | POA: Diagnosis not present

## 2020-10-23 DIAGNOSIS — R82998 Other abnormal findings in urine: Secondary | ICD-10-CM | POA: Diagnosis not present

## 2020-10-23 DIAGNOSIS — M19042 Primary osteoarthritis, left hand: Secondary | ICD-10-CM | POA: Diagnosis not present

## 2020-10-23 DIAGNOSIS — M19041 Primary osteoarthritis, right hand: Secondary | ICD-10-CM | POA: Diagnosis not present

## 2020-10-23 DIAGNOSIS — I10 Essential (primary) hypertension: Secondary | ICD-10-CM | POA: Diagnosis not present

## 2020-10-23 DIAGNOSIS — I119 Hypertensive heart disease without heart failure: Secondary | ICD-10-CM | POA: Diagnosis not present

## 2020-10-23 DIAGNOSIS — E039 Hypothyroidism, unspecified: Secondary | ICD-10-CM | POA: Diagnosis not present

## 2020-12-05 ENCOUNTER — Other Ambulatory Visit: Payer: Self-pay

## 2020-12-05 ENCOUNTER — Emergency Department (HOSPITAL_COMMUNITY): Payer: Medicare HMO

## 2020-12-05 ENCOUNTER — Emergency Department (HOSPITAL_COMMUNITY)
Admission: EM | Admit: 2020-12-05 | Discharge: 2020-12-05 | Disposition: A | Discharge status: Home / Self Care | Payer: Medicare HMO | Attending: Emergency Medicine | Admitting: Emergency Medicine

## 2020-12-05 DIAGNOSIS — U071 COVID-19: Secondary | ICD-10-CM

## 2020-12-05 DIAGNOSIS — Z85828 Personal history of other malignant neoplasm of skin: Secondary | ICD-10-CM | POA: Insufficient documentation

## 2020-12-05 DIAGNOSIS — Z7982 Long term (current) use of aspirin: Secondary | ICD-10-CM | POA: Insufficient documentation

## 2020-12-05 DIAGNOSIS — R531 Weakness: Secondary | ICD-10-CM | POA: Diagnosis not present

## 2020-12-05 DIAGNOSIS — R0602 Shortness of breath: Secondary | ICD-10-CM | POA: Diagnosis not present

## 2020-12-05 DIAGNOSIS — R509 Fever, unspecified: Secondary | ICD-10-CM | POA: Diagnosis not present

## 2020-12-05 DIAGNOSIS — I517 Cardiomegaly: Secondary | ICD-10-CM | POA: Diagnosis not present

## 2020-12-05 DIAGNOSIS — J189 Pneumonia, unspecified organism: Secondary | ICD-10-CM | POA: Diagnosis not present

## 2020-12-05 DIAGNOSIS — Z87891 Personal history of nicotine dependence: Secondary | ICD-10-CM | POA: Insufficient documentation

## 2020-12-05 DIAGNOSIS — J90 Pleural effusion, not elsewhere classified: Secondary | ICD-10-CM | POA: Diagnosis not present

## 2020-12-05 LAB — CBC
HCT: 41.3 % (ref 36.0–46.0)
Hemoglobin: 12.8 g/dL (ref 12.0–15.0)
MCH: 31 pg (ref 26.0–34.0)
MCHC: 31 g/dL (ref 30.0–36.0)
MCV: 100 fL (ref 80.0–100.0)
Platelets: 109 10*3/uL — ABNORMAL LOW (ref 150–400)
RBC: 4.13 MIL/uL (ref 3.87–5.11)
RDW: 13 % (ref 11.5–15.5)
WBC: 4.6 10*3/uL (ref 4.0–10.5)
nRBC: 0 % (ref 0.0–0.2)

## 2020-12-05 LAB — RESP PANEL BY RT-PCR (FLU A&B, COVID) ARPGX2
Influenza A by PCR: NEGATIVE
Influenza B by PCR: NEGATIVE
SARS Coronavirus 2 by RT PCR: POSITIVE — AB

## 2020-12-05 LAB — BASIC METABOLIC PANEL
Anion gap: 12 (ref 5–15)
BUN: 15 mg/dL (ref 8–23)
CO2: 21 mmol/L — ABNORMAL LOW (ref 22–32)
Calcium: 8.3 mg/dL — ABNORMAL LOW (ref 8.9–10.3)
Chloride: 101 mmol/L (ref 98–111)
Creatinine, Ser: 0.95 mg/dL (ref 0.44–1.00)
GFR, Estimated: >60 mL/min (ref >60)
Glucose, Bld: 91 mg/dL (ref 70–99)
Potassium: 4 mmol/L (ref 3.5–5.1)
Sodium: 134 mmol/L — ABNORMAL LOW (ref 135–145)

## 2020-12-05 NOTE — ED Notes (Signed)
Discharge instructions provided to patient. Verbalized understanding. Alert and oriented. Escorted out of ED via w/c. °

## 2020-12-05 NOTE — ED Notes (Signed)
Assumed care of patient. Patient c/o "feeling sick and worsening since today." Patient cannot really describe worsening symptoms but reports she feels worse. Alert and oriented.

## 2020-12-05 NOTE — Discharge Instructions (Signed)
Please make sure you follow-up with your regular doctor regarding your COVID-19 diagnosis.  Also be sure that you are updating them based on your symptoms.  Like we discussed, please check your pulse oxygen levels throughout the day.  If you find that they are lowering into the 80s and staying there for a short period of time, it is reasonable to come back to the emergency department for reevaluation.  If you start to experience worsening symptoms, feel free to return to the emergency department for reevaluation at any time.  It was a pleasure to meet you.

## 2020-12-05 NOTE — ED Provider Notes (Signed)
[facility] EMERGENCY DEPARTMENT Provider Note   CSN: [id number] Arrival date & time: 12/05/20  1724     History Chief Complaint  Patient presents with  . Weakness    Suzanne Leon is a 79 y.o. female.  HPI   Patient is a 79 year old female with a medical history as noted below.  For about 8 days patient has been experiencing weakness, fevers, body aches, shortness of breath, vertigo.  Patient has an essential tremor but feels as if it is worse than normal.  She had a single dose of the COVID-19 vaccine but states that due to adverse symptoms that she did not receive a second dose.  Patient denies chest pain, abdominal pain, nausea, vomiting, diarrhea.  I spoke to patient's son as well who lives next door.  States that she was experiencing cold-like symptoms for the past couple of weeks.  Less energy than normal.  She did have a bout of vertigo about 3 days ago but this is resolved.  She has had a few episodes of diarrhea but no recurrent or severe diarrhea.  Feels that over the past couple of days she seems to be eating more, walking around more, getting her energy back.  He states that he spoke to a friend at church and after discussing her oxygen levels getting down to 93% at 1 point, he decided to bring her to the emergency department for evaluation.     Past Medical History:  Diagnosis Date  . Anxiety   . Arthritis   . Chronic headaches   . Depression   . GERD (gastroesophageal reflux disease)   . HLD (hyperlipidemia)   . Internal hemorrhoids   . Osteoporosis   . Skin cancer    skin cancer    Patient Active Problem List   Diagnosis Date Noted  . Educated about COVID-19 virus infection 04/02/2019  . PVC (premature ventricular contraction) 04/02/2019  . Globus sensation 10/19/2018  . Essential hypertension 10/19/2018  . Weight loss 10/19/2018  . Alopecia totalis 10/27/2016  . Insomnia 10/27/2016  . Essential tremor 10/27/2016    Past  Surgical History:  Procedure Laterality Date  . ABDOMINAL HYSTERECTOMY    . APPENDECTOMY    . COLONOSCOPY    . ESOPHAGEAL MANOMETRY  12/18/2012   Procedure: ESOPHAGEAL MANOMETRY (EM);  Surgeon: Lafayette Dragon, MD;  Location: [facility] ENDOSCOPY;  Service: Endoscopy;  Laterality: N/A;  . EYE SURGERY    . LAMINECTOMY    . TONSILLECTOMY       OB History   No obstetric history on file.     Family History  Problem Relation Age of Onset  . Breast cancer Daughter   . Thyroid disease Daughter   . Ulcers Father        peptic  . Throat cancer Mother 63  . Migraines Son   . Diabetes Neg Hx     Social History   Tobacco Use  . Smoking status: Former Smoker    Types: Cigarettes  . Smokeless tobacco: Never Used  . Tobacco comment: when she was a teenager  Vaping Use  . Vaping Use: Never used  Substance Use Topics  . Alcohol use: No  . Drug use: No    Home Medications Prior to Admission medications   Medication Sig Start Date End Date Taking? Authorizing Provider  aspirin EC 81 MG tablet Take 1 tablet (81 mg total) by mouth daily. 10/31/19   Adrian Prows, MD  atorvastatin (LIPITOR) 10 MG  tablet Take 1 tablet (10 mg total) by mouth daily. 12/12/19 12/06/20  Yates Decamp, MD  propranolol (INDERAL) 10 MG tablet Take 10 mg by mouth daily as needed.    [provider]    Allergies    Patient has no known allergies.  Review of Systems   Review of Systems  All other systems reviewed and are negative. Ten systems reviewed and are negative for acute change, except as noted in the HPI.   Physical Exam Updated Vital Signs BP (!) 152/73   Pulse 72   Temp 98.6 F (37 C) (Oral)   Resp 17   SpO2 96%   Physical Exam Vitals and nursing note reviewed.  Constitutional:      General: She is not in acute distress.    Appearance: Normal appearance. She is not ill-appearing, toxic-appearing or diaphoretic.  HENT:     Head: Normocephalic and atraumatic.     Right Ear: External ear normal.      Left Ear: External ear normal.     Nose: Nose normal.     Mouth/Throat:     Mouth: Mucous membranes are moist.     Pharynx: Oropharynx is clear. No oropharyngeal exudate or posterior oropharyngeal erythema.  Eyes:     Extraocular Movements: Extraocular movements intact.  Cardiovascular:     Rate and Rhythm: Normal rate and regular rhythm.     Pulses: Normal pulses.     Heart sounds: Normal heart sounds. No murmur heard. No friction rub. No gallop.      Comments: RRR.  No murmurs, rubs, or gallops. Pulmonary:     Effort: Pulmonary effort is normal. No respiratory distress.     Breath sounds: Normal breath sounds. No stridor. No wheezing, rhonchi or rales.     Comments: Lungs are clear to auscultation bilaterally.  Oxygen saturations in the high 90s on room air.  Patient stands and ambulates in place with oxygen saturations maintaining in the high 90s. Abdominal:     General: Abdomen is flat.     Tenderness: There is no abdominal tenderness.  Musculoskeletal:        General: Normal range of motion.     Cervical back: Normal range of motion and neck supple. No tenderness.  Skin:    General: Skin is warm and dry.  Neurological:     General: No focal deficit present.     Mental Status: She is alert and oriented to person, place, and time.     Comments: Diffuse tremor noted.  Patient able to stand and ambulate unassisted.  Moving all 4 extremities with ease.  No gross deficits.  Psychiatric:        Mood and Affect: Mood normal.        Behavior: Behavior normal.    ED Results / Procedures / Treatments   Labs (all labs ordered are listed, but only abnormal results are displayed) Labs Reviewed  RESP PANEL BY RT-PCR (FLU A&B, COVID) ARPGX2 - Abnormal; Notable for the following components:      Result Value   SARS Coronavirus 2 by RT PCR POSITIVE (*)    All other components within normal limits  BASIC METABOLIC PANEL - Abnormal; Notable for the following components:   Sodium 134 (*)     CO2 21 (*)    Calcium 8.3 (*)    All other components within normal limits  CBC - Abnormal; Notable for the following components:   Platelets 109 (*)    All other components  within normal limits  URINALYSIS, ROUTINE W REFLEX MICROSCOPIC   EKG EKG Interpretation  Date/Time:  Friday December 05 2020 17:29:14 EST Ventricular Rate:  73 PR Interval:  128 QRS Duration: 74 QT Interval:  414 QTC Calculation: 456 R Axis:   -55 Text Interpretation: Sinus rhythm with Premature supraventricular complexes Left axis deviation Nonspecific ST and T wave abnormality Abnormal ECG Confirmed by Kennis Carina [phone]) on 12/05/2020 9:17:32 PM  Radiology DG Chest 2 View  Result Date: 12/05/2020 CLINICAL DATA:  Weakness and shortness of breath. Body aches and intermittent fevers. EXAM: CHEST - 2 VIEW COMPARISON:  Chest CT 04/25/2020 FINDINGS: The lungs are hyperinflated. Blunting of the costophrenic angles may be due to small effusions or hyperinflation. Mild cardiomegaly appears similar to prior. Aortic atherosclerosis. Vague ill-defined opacities in the left mid lung zone, right upper lobe, and right lung base. No pneumothorax. No convincing pulmonary edema. Bones are under mineralized without acute osseous abnormality. IMPRESSION: 1. Vague ill-defined opacities in the left mid lung zone, right upper lobe, and right lung base, suspicious for multifocal pneumonia. 2. Blunting of the costophrenic angles may be due to hyperinflation or small effusions. Electronically Signed   By: Narda Rutherford M.D.   On: 12/05/2020 18:21   Procedures Procedures (including critical care time)  Medications Ordered in ED Medications - No data to display  ED Course  I have reviewed the triage vital signs and the nursing notes.  Pertinent labs & imaging results that were available during my care of the patient were reviewed by me and considered in my medical decision making (see chart for details).  Clinical Course as  of 12/05/20 2113  Fri Dec 05, 2020  2102 DG Chest 2 View IMPRESSION: 1. Vague ill-defined opacities in the left mid lung zone, right upper lobe, and right lung base, suspicious for multifocal pneumonia. 2. Blunting of the costophrenic angles may be due to hyperinflation or small effusions. [LJ]    Clinical Course User Index [LJ] Placido Sou, PA-C   MDM Rules/Calculators/A&P                          Patient is a 79 year old female that presents today due to symptoms related to COVID-19.  She received 1 dose of the COVID-19 vaccine but due to adverse effects did not receive a second dose.  Chest x-ray showing a multifocal pneumonia.  No leukocytosis on CBC.  COVID-19 test is positive.  Mild hyponatremia 134.  Decreased CO2 at 21.  Hypocalcemia at 8.3.  No hypoxia noted on my exam.  Patient stood and ambulated in place with no resulting hypoxia.  Oxygen saturations in the high 90s on room air.  No tachycardia.  Afebrile.  Discussed findings with patient as well as her son.  Feel that she is safe for discharge at this time.  We discussed return precautions at length.  Her son purchased a pulse ox and is going to evaluate her oxygen levels at home.  Recommended PCP follow-up.  Patient was discussed with and evaluated by my attending physician who agrees with the above plan.  Patient's vital signs are stable. Given the recent MAB shortage patient unable to be referred to or given MAB infusion at this time.   Final Clinical Impression(s) / ED Diagnoses Final diagnoses:  COVID-19   Rx / DC Orders ED Discharge Orders    None       Placido Sou, PA-C 12/05/20 2151    Bero,  Barth Kirks, MD 12/05/20 2350

## 2020-12-05 NOTE — ED Triage Notes (Signed)
Pt accompanied by son who reports that since 12/23, pt having varying degrees of weakness, fever, body aches, shortness of breath, and vertigo. Pt has essential tremor but is shaking more than normal.

## 2020-12-08 DIAGNOSIS — H81393 Other peripheral vertigo, bilateral: Secondary | ICD-10-CM | POA: Diagnosis not present

## 2020-12-16 DIAGNOSIS — I119 Hypertensive heart disease without heart failure: Secondary | ICD-10-CM | POA: Diagnosis not present

## 2020-12-16 DIAGNOSIS — I639 Cerebral infarction, unspecified: Secondary | ICD-10-CM | POA: Diagnosis not present

## 2020-12-16 DIAGNOSIS — R42 Dizziness and giddiness: Secondary | ICD-10-CM | POA: Diagnosis not present

## 2020-12-16 DIAGNOSIS — I10 Essential (primary) hypertension: Secondary | ICD-10-CM | POA: Diagnosis not present

## 2020-12-18 ENCOUNTER — Other Ambulatory Visit: Payer: Self-pay | Admitting: Internal Medicine

## 2020-12-18 ENCOUNTER — Other Ambulatory Visit (HOSPITAL_COMMUNITY): Payer: Self-pay | Admitting: Internal Medicine

## 2020-12-18 DIAGNOSIS — R42 Dizziness and giddiness: Secondary | ICD-10-CM

## 2020-12-20 ENCOUNTER — Ambulatory Visit (HOSPITAL_COMMUNITY)
Admission: RE | Admit: 2020-12-20 | Discharge: 2020-12-20 | Disposition: A | Discharge status: Home / Self Care | Payer: Medicare HMO | Source: Ambulatory Visit | Attending: Internal Medicine | Admitting: Internal Medicine

## 2020-12-20 ENCOUNTER — Other Ambulatory Visit: Payer: Self-pay

## 2020-12-20 DIAGNOSIS — R42 Dizziness and giddiness: Secondary | ICD-10-CM | POA: Insufficient documentation

## 2021-02-02 DIAGNOSIS — H938X1 Other specified disorders of right ear: Secondary | ICD-10-CM | POA: Diagnosis not present

## 2021-02-02 DIAGNOSIS — E039 Hypothyroidism, unspecified: Secondary | ICD-10-CM | POA: Diagnosis not present

## 2021-02-02 DIAGNOSIS — M19041 Primary osteoarthritis, right hand: Secondary | ICD-10-CM | POA: Diagnosis not present

## 2021-02-02 DIAGNOSIS — I10 Essential (primary) hypertension: Secondary | ICD-10-CM | POA: Diagnosis not present

## 2021-02-02 DIAGNOSIS — I2510 Atherosclerotic heart disease of native coronary artery without angina pectoris: Secondary | ICD-10-CM | POA: Diagnosis not present

## 2021-02-02 DIAGNOSIS — R42 Dizziness and giddiness: Secondary | ICD-10-CM | POA: Diagnosis not present

## 2021-04-08 IMAGING — DX DG CHEST 2V
2 series · 2 of 2 positions shown · non-contrast
Comparison: Chest CT 04/25/2020

CLINICAL DATA: Weakness and shortness of breath. Body aches and
intermittent fevers.

EXAM:
CHEST - 2 VIEW

[chest ap]
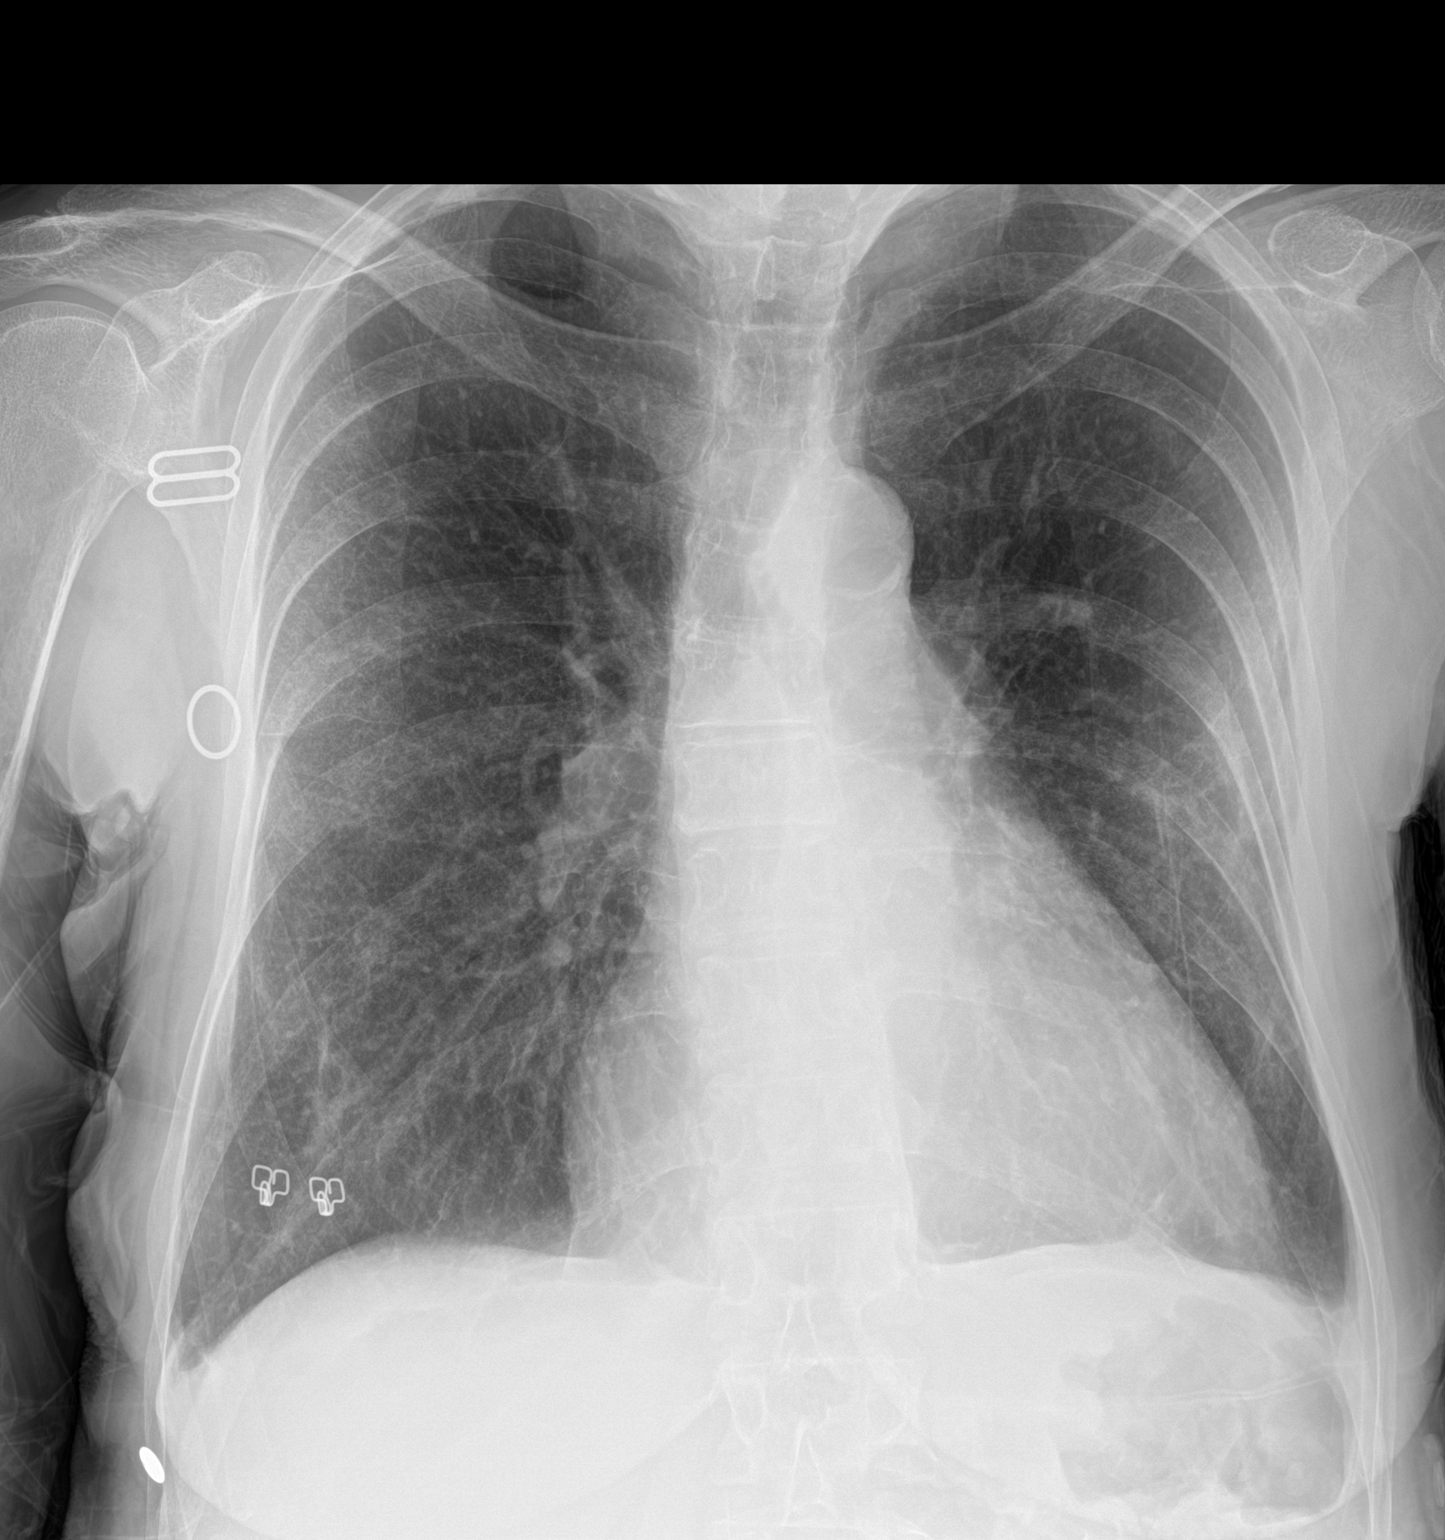

[chest lat]
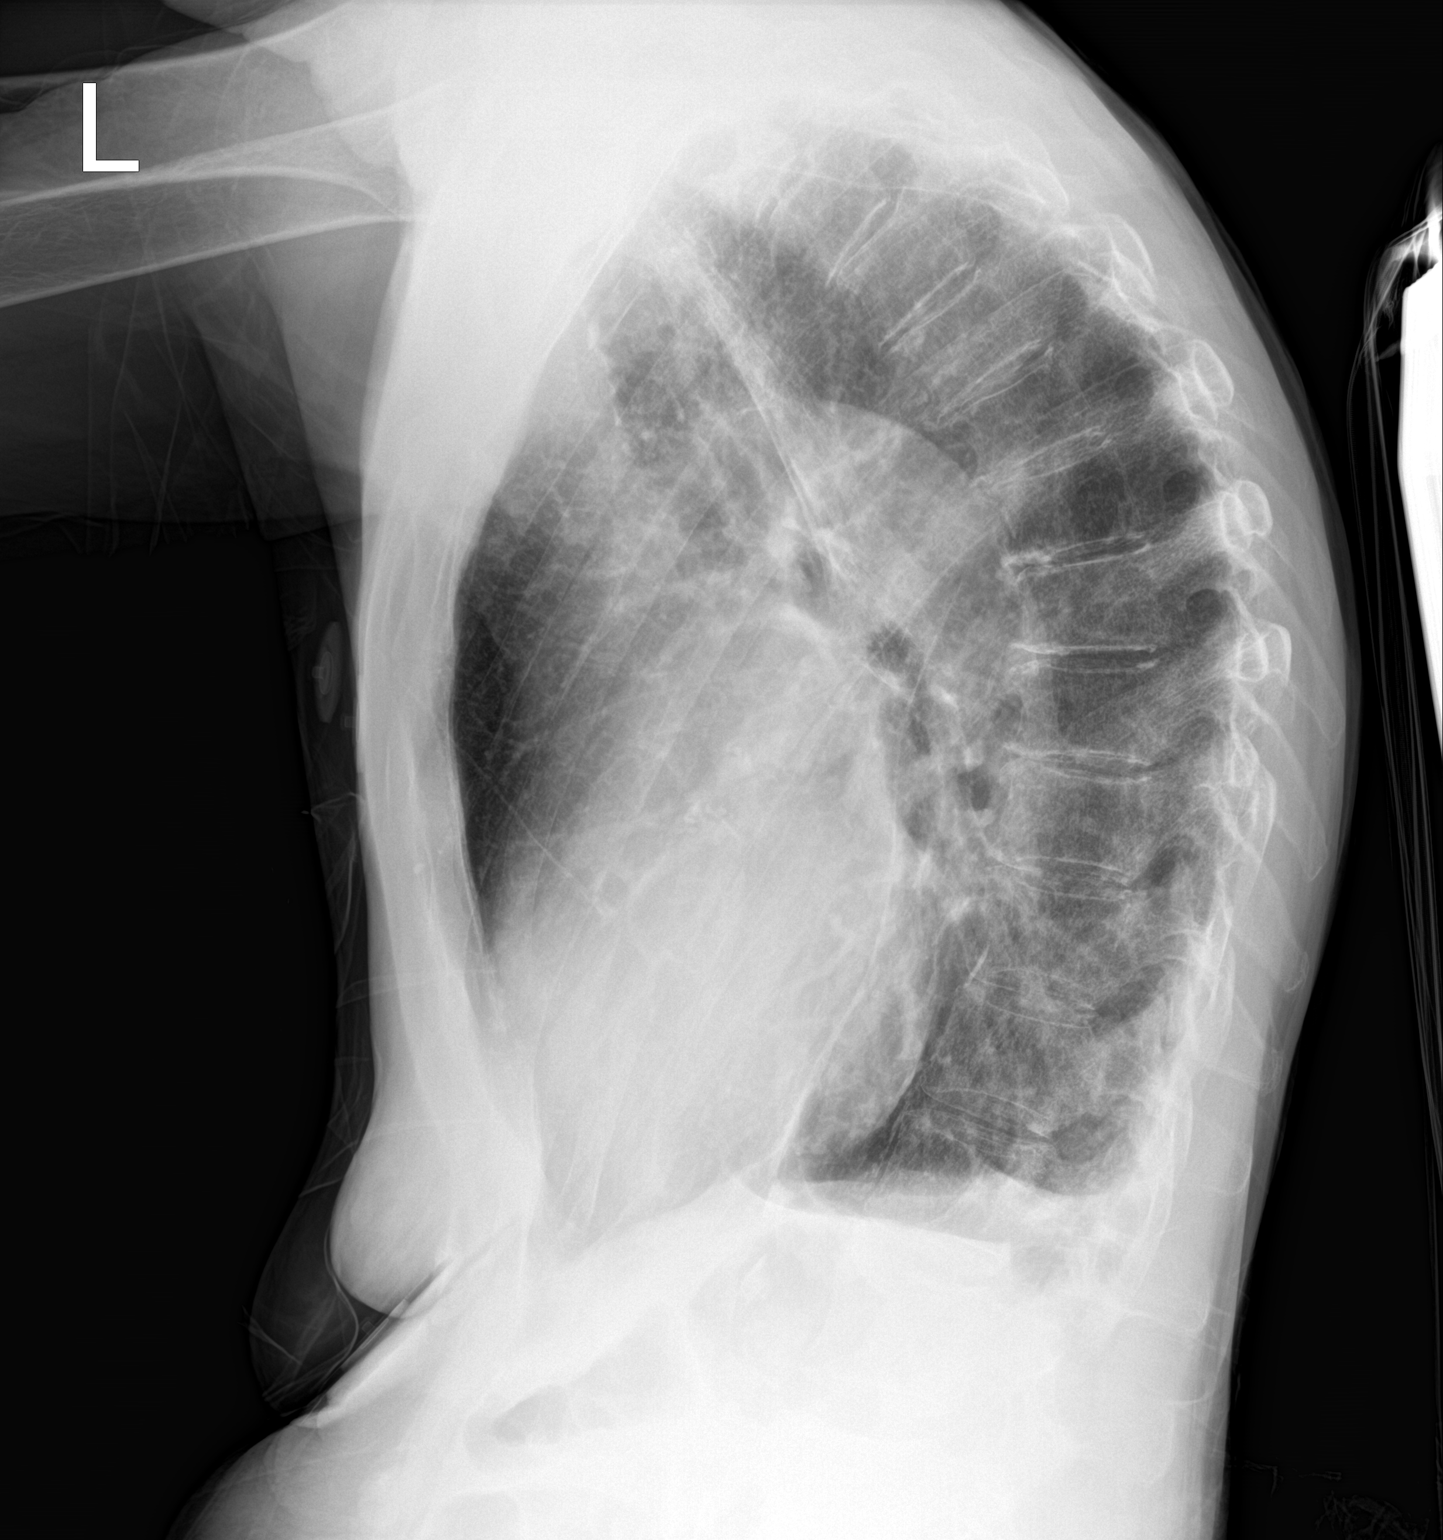

[2 of 2 positions shown; findings below may reference images not displayed]

FINDINGS: The lungs are hyperinflated. Blunting of the costophrenic angles may
be due to small effusions or hyperinflation. Mild cardiomegaly
appears similar to prior. Aortic atherosclerosis. Vague ill-defined
opacities in the left mid lung zone, right upper lobe, and right
lung base. No pneumothorax. No convincing pulmonary edema. Bones are
under mineralized without acute osseous abnormality.
IMPRESSION: 1. Vague ill-defined opacities in the left mid lung zone, right
upper lobe, and right lung base, suspicious for multifocal
pneumonia.
2. Blunting of the costophrenic angles may be due to hyperinflation
or small effusions.

## 2021-04-09 DIAGNOSIS — H903 Sensorineural hearing loss, bilateral: Secondary | ICD-10-CM | POA: Diagnosis not present

## 2021-04-09 DIAGNOSIS — R1310 Dysphagia, unspecified: Secondary | ICD-10-CM | POA: Diagnosis not present

## 2021-04-23 IMAGING — MR MR HEAD W/O CM
10 series · 41 of 48 positions shown · non-contrast
Comparison: January 03, 2020.

CLINICAL DATA: Dizziness and giddiness.

EXAM:
MRI HEAD WITHOUT CONTRAST
TECHNIQUE: Multiplanar, multiecho pulse sequences of the brain and surrounding
structures were obtained without intravenous contrast.

[Series 9: dwi_tracew · axial · 3.0mm · 0.88mm/px · z∈[-13,+137]mm · 8 of 102 slices shown]
[im 1/102]
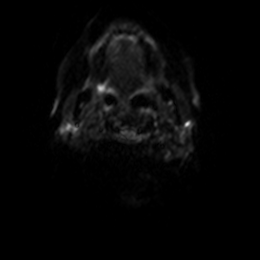
[im 21/102]
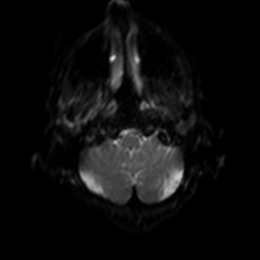
[im 31/102]
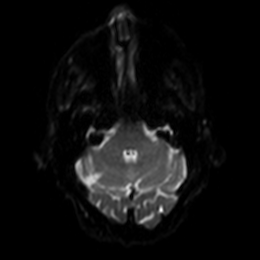
[im 41/102]
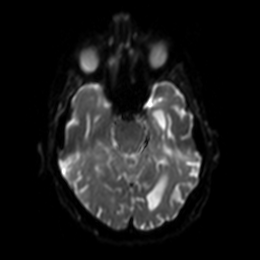
[im 61/102]
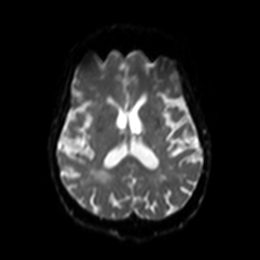
[im 71/102]
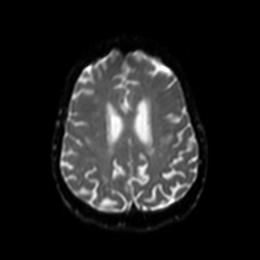
[im 81/102]
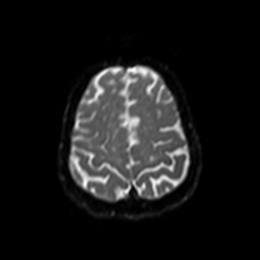
[im 102/102]
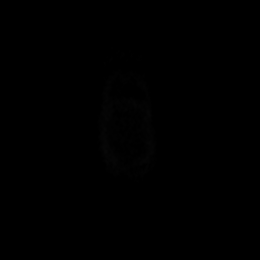

[Series 10: dwi_adc · axial · 3.0mm · 0.88mm/px · 1 of 50 slices shown]
[im 1/50]
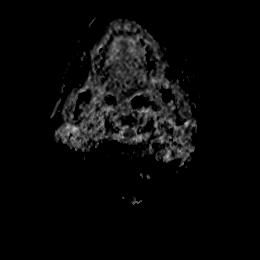

[Series 11: GRE · axial · 3.0mm · 0.41mm/px · z∈[+9,+155]mm · 6 of 51 slices shown]
[im 1/51]
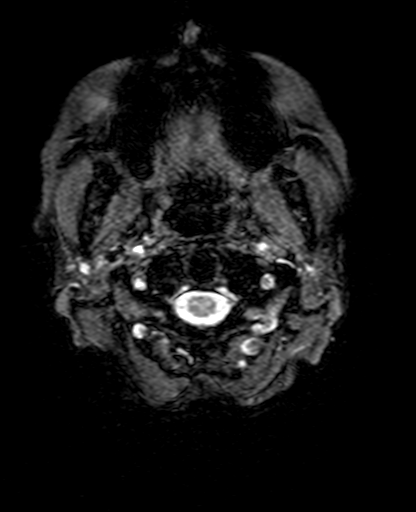
[im 11/51]
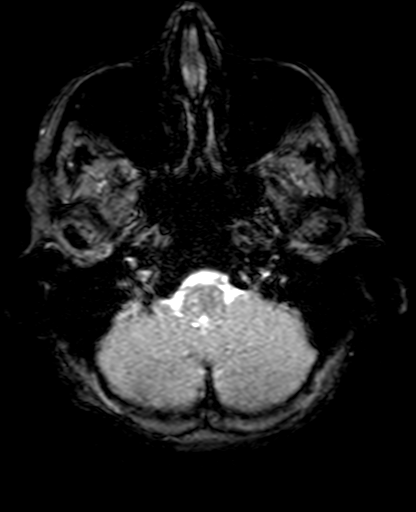
[im 21/51]
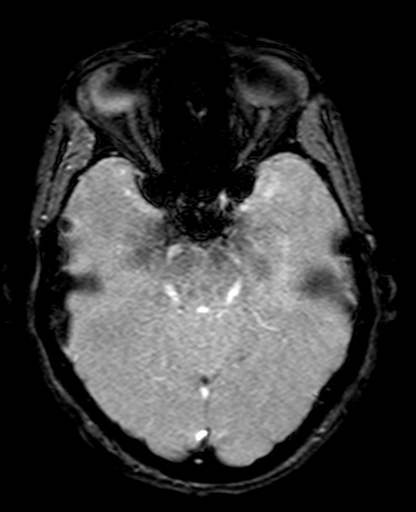
[im 31/51]
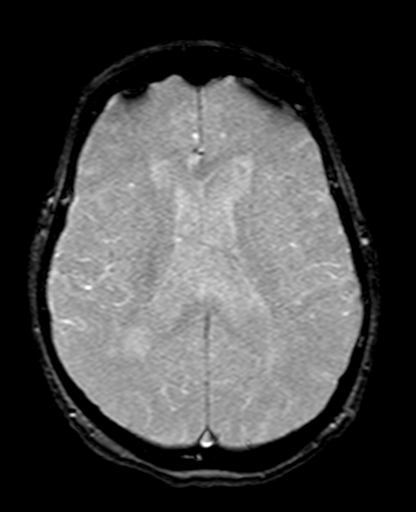
[im 41/51]
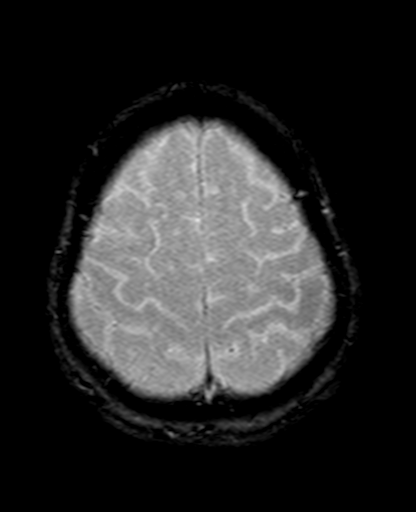
[im 51/51]
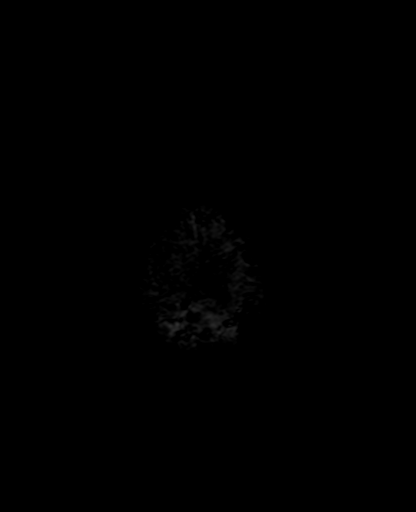

[Series 12: FLAIR · axial · 4.0mm · 0.82mm/px · z∈[+18,+160]mm · 4 of 38 slices shown]
[im 1/38]
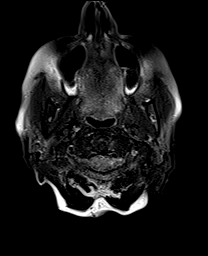
[im 13/38]
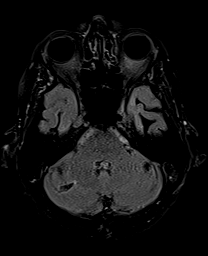
[im 25/38]
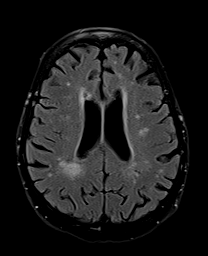
[im 38/38]
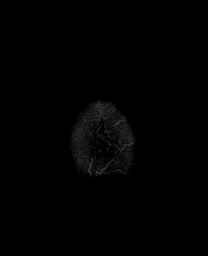

[Series 13: DWI · coronal · 5.0mm · 1.31mm/px · 6 of 56 slices shown (1 of 2)]
[im 1/56]
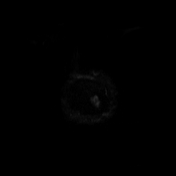
[im 12/56]
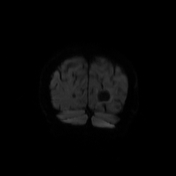
[im 23/56]
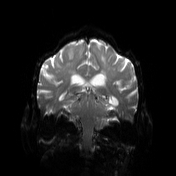
[im 34/56]
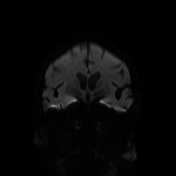
[im 45/56]
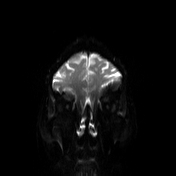
[im 56/56]
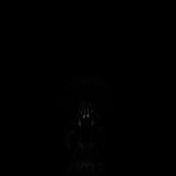

[Series 14: DWI · coronal · 5.0mm · 1.31mm/px · 3 of 28 slices shown (2 of 2)]
[im 1/28]
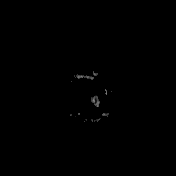
[im 14/28]
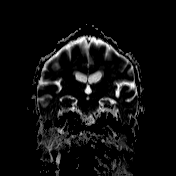
[im 28/28]
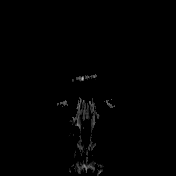

[Series 15: T2 · sagittal · 5.0mm · 0.47mm/px · 3 of 24 slices shown (1 of 3)]
[im 1/24]
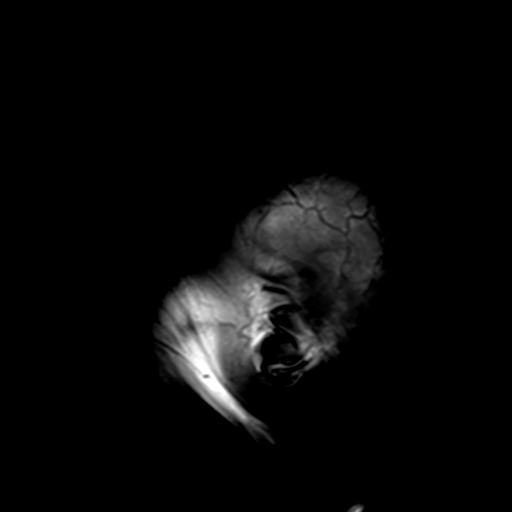
[im 12/24]
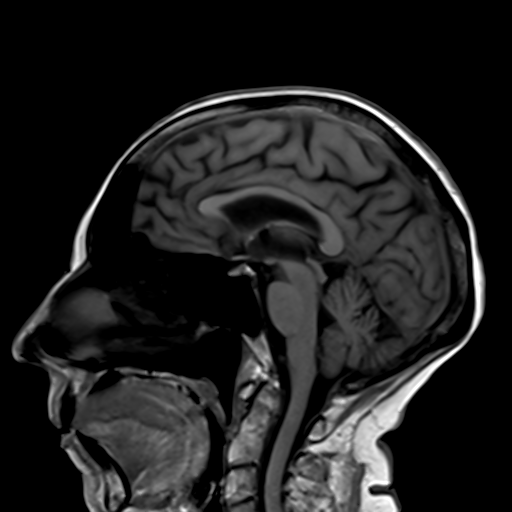
[im 24/24]
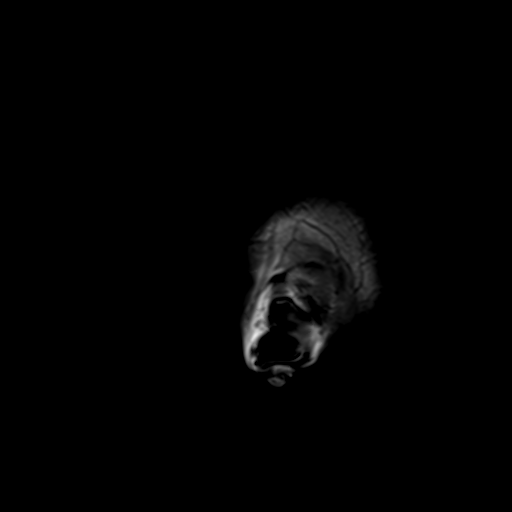

[Series 16: T2 · axial · 5.0mm · 0.41mm/px · z∈[+13,+157]mm · 3 of 24 slices shown (2 of 3)]
[im 1/24]
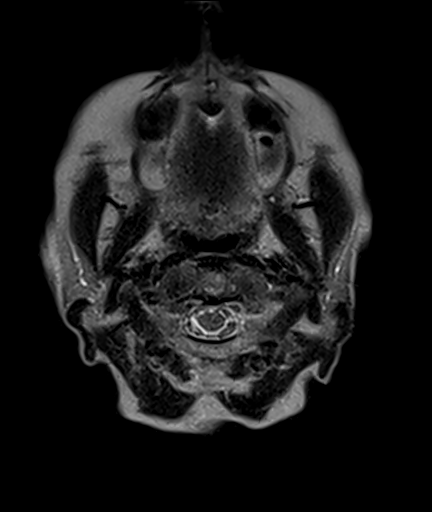
[im 12/24]
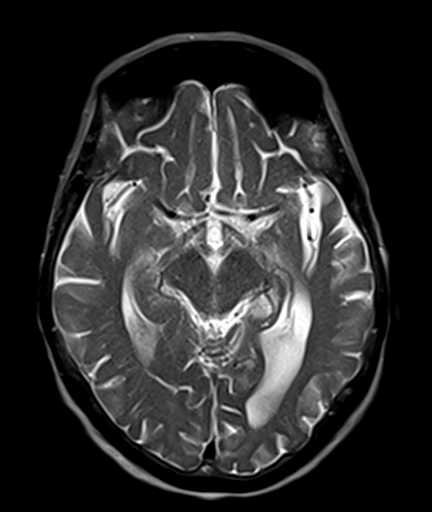
[im 24/24]
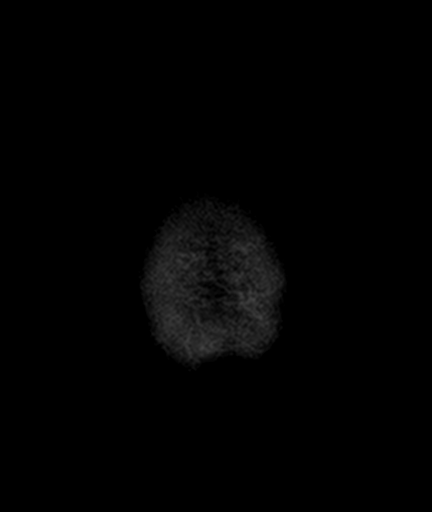

[Series 17: T1 · axial · 4.0mm · 0.41mm/px · z∈[+18,+160]mm · 4 of 38 slices shown]
[im 1/38]
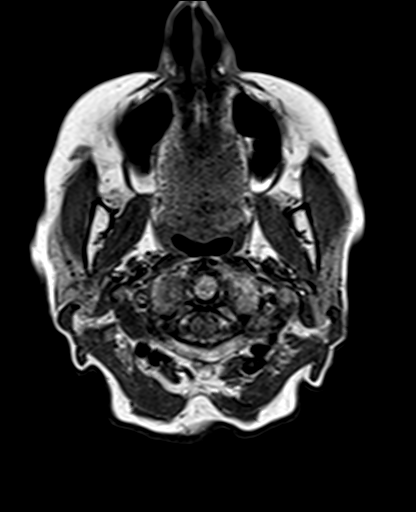
[im 13/38]
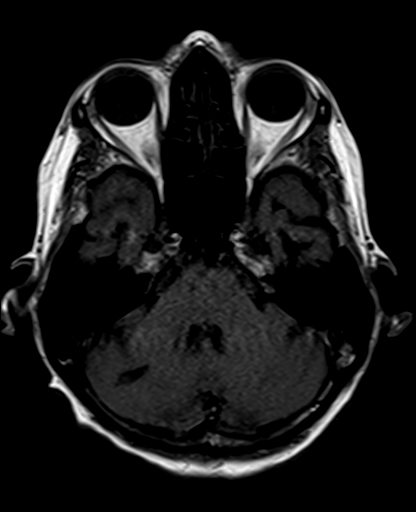
[im 25/38]
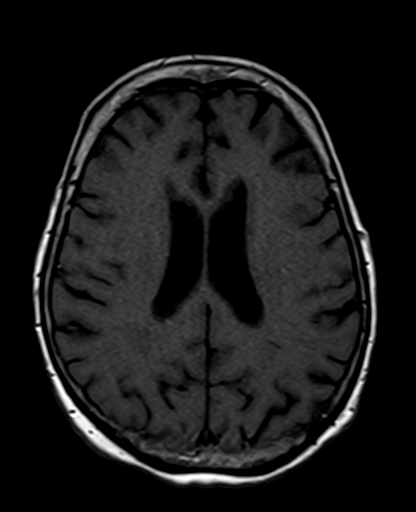
[im 38/38]
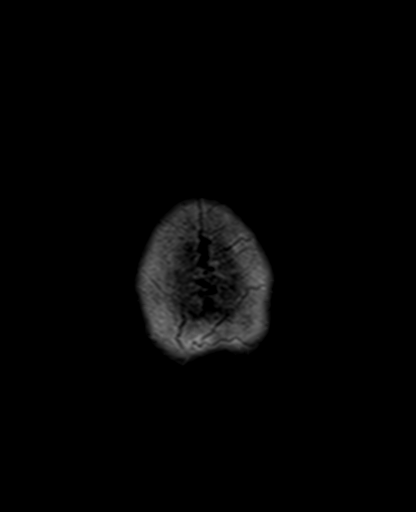

[Series 18: T2 · coronal · 5.0mm · 0.86mm/px · 3 of 27 slices shown (3 of 3)]
[im 1/27]
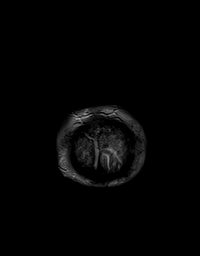
[im 14/27]
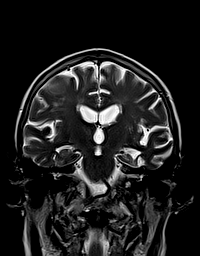
[im 27/27]
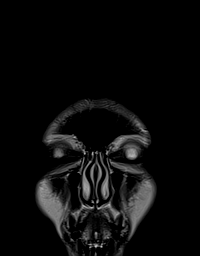

[41 of 48 positions shown; findings below may reference images not displayed]

FINDINGS: Brain: No acute infarction, hemorrhage, hydrocephalus, extra-axial
collection or mass lesion. Similar remote right cerebellar lacunar
infarct. Similar moderate T2/FLAIR hyperintensities within the white
matter, compatible with chronic microvascular ischemic disease.
Similar mildly asymmetric enlargement of the temporal horn left
lateral ventricle.

Vascular: Major arterial flow voids are maintained at the skull
base.

Skull and upper cervical spine: Normal marrow signal.

Sinuses/Orbits: The sinuses are clear. Unremarkable orbits.

Other: Motion limited study.
IMPRESSION: 1. No acute abnormality.
2. Remote right cerebellar infarct.
3. Moderate chronic microvascular ischemic disease.

## 2021-06-11 ENCOUNTER — Ambulatory Visit: Payer: Medicare HMO | Admitting: Gastroenterology

## 2021-06-11 ENCOUNTER — Other Ambulatory Visit: Payer: Self-pay

## 2021-06-11 ENCOUNTER — Encounter: Payer: Self-pay | Admitting: Gastroenterology

## 2021-06-11 VITALS — BP 150/75 | HR 60 | Temp 98.5°F | Wt 122.4 lb

## 2021-06-11 DIAGNOSIS — R1310 Dysphagia, unspecified: Secondary | ICD-10-CM

## 2021-06-11 MED ORDER — OMEPRAZOLE 40 MG PO CPDR
40.0000 mg | DELAYED_RELEASE_CAPSULE | Freq: Every day | ORAL | 1 refills | Status: DC
Start: 2021-06-11 — End: 2021-07-09

## 2021-06-11 NOTE — Progress Notes (Signed)
[name] MD, MRCP(U.K) [address]  [place]  [place], [place] [address]  Main: [phone]  Fax: [phone]   Gastroenterology Consultation  Referring Provider:     Beverly Gust, MD Primary Care Physician:  Leanna Battles, MD Primary Gastroenterologist:  Dr. [name]  Reason for Consultation:     dysphagia  HPI:   Suzanne Leon is a 80 y.o. y/o female referred for dysphagia  In 2014 she underwent an esophageal manometry study and only abnormality seen was increased LES pressure.  An upper endoscopy into December 2013 demonstrated no clear stricture but she was empirically dilated to 56 [place] underwent barium swallow in May 2021 that showed no abnormality.  CT chest at the same time also showed no abnormality of the esophagus.  She has a history of GERD but has had issues with solid food dysphagia for many years.  I do not see any biopsies taken to rule out eosinophilic esophagitis.  She has presently been referred for dysphagia.   She has had issues with swallowing for many years at least from 2013 she was accompanied by her son today.  Of late she states it is getting harder for her to swallow she complains of a dry mouth making it hard to swallow, at times when she is having her food particularly solids it goes the wrong way and makes her cough.  She does complain that food gets stuck in her neck.  She always has a sensation of something hanging around in the neck.  Denies any vomiting.  Does have heartburn with certain foods.  Not on any PPI.  Has lost weight due to inability to eat.  Past Medical History:  Diagnosis Date   Anxiety    Arthritis    Chronic headaches    Depression    GERD (gastroesophageal reflux disease)    HLD (hyperlipidemia)    Internal hemorrhoids    Osteoporosis    Skin cancer    skin cancer    Past Surgical History:  Procedure Laterality Date   ABDOMINAL HYSTERECTOMY     APPENDECTOMY     COLONOSCOPY     ESOPHAGEAL  MANOMETRY  12/18/2012   Procedure: ESOPHAGEAL MANOMETRY (EM);  Surgeon: Lafayette Dragon, MD;  Location: [facility];  Service: Endoscopy;  Laterality: N/A;   EYE SURGERY     LAMINECTOMY     TONSILLECTOMY      Prior to Admission medications   Medication Sig Start Date End Date Taking? Authorizing Provider  aspirin EC 81 MG tablet Take 1 tablet (81 mg total) by mouth daily. 10/31/19   Adrian Prows, MD  atorvastatin (LIPITOR) 10 MG tablet Take 1 tablet (10 mg total) by mouth daily. 12/12/19 12/06/20  Adrian Prows, MD  propranolol (INDERAL) 10 MG tablet Take 10 mg by mouth daily as needed.    [provider]    Family History  Problem Relation Age of Onset   Breast cancer Daughter    Thyroid disease Daughter    Ulcers Father        peptic   Throat cancer Mother 106   Migraines Son    Diabetes Neg Hx      Social History   Tobacco Use   Smoking status: Former    Pack years: 0.00    Types: Cigarettes   Smokeless tobacco: Never   Tobacco comments:    when she was a teenager  Vaping Use   Vaping Use: Never used  Substance Use  Topics   Alcohol use: No   Drug use: No    Allergies as of 06/11/2021   (No Known Allergies)    Review of Systems:    All systems reviewed and negative except where noted in HPI.   Physical Exam:  BP (!) 150/75   Pulse 60   Temp 98.5 F (36.9 C) (Oral)   Wt 122 lb 6.4 oz (55.5 kg)   BMI 19.17 kg/m  No LMP recorded. Patient has had a hysterectomy. Psych:  Alert and cooperative. Normal mood and affect. General:   Alert,  Well-developed, well-nourished, pleasant and cooperative in NAD Head:  Normocephalic and atraumatic. Eyes:  Sclera clear, no icterus.   Conjunctiva pink. Ears:  Normal auditory acuity. Nose:  No deformity, discharge, or lesions. Mouth:  No deformity or lesions,oropharynx pink & moist. Neck:  Supple; no masses or thyromegaly. Neurologic:  Alert and oriented x3;  grossly normal neurologically. Psych:  Alert and cooperative.  Normal mood and affect.  Imaging Studies: No results found.  Assessment and Plan:   Suzanne Leon is a 80 y.o. y/o female has been referred for dysphagia.  History is very suggestive of possible dry mouth making it hard to swallow, an element of transfer dysphagia due to history of coughing.  Achalasia is also part of the differentials although the barium swallow appeared to be normal.  Plan 1.  Commence on PPI to empirically treat any reflux related esophageal spasms. 2.  EGD to rule out eosinophilic esophagitis which has not been evaluated in the past. 3.  If dysphagia persists may need to consider functional dysphagia as part of the differential diagnosis or consider repeating esophageal manometry to rule out achalasia.  Although this is less likely with a normal barium swallow last year. 4.  Modified barium swallow to rule out transfer dysphagia 5.  She complains of a dry mouth suggested to accompany all meals with a glass of water or drink to help lubricate the mouth 6.  States that she has been losing weight hence suggested to use Ensure 2-3 times a day.  I have discussed alternative options, risks & benefits,  which include, but are not limited to, bleeding, infection, perforation,respiratory complication & drug reaction.  The patient agrees with this plan & written consent will be obtained.    Follow up in 6 to 8 weeks  Dr [name] MD,MRCP(U.K)

## 2021-06-11 NOTE — Addendum Note (Signed)
Addended by: Lurlean Nanny on: 06/11/2021 03:46 PM   Modules accepted: Orders, SmartSet

## 2021-06-11 NOTE — Patient Instructions (Addendum)
Call [facility] at [phone] between 1-3 pm tomorrow June 12, 2021 to get arrival time for Monday procedure  [place], [place], [place] [address]  Follow up appointment scheduled in office for July 18th at 1 PM

## 2021-06-12 ENCOUNTER — Encounter: Payer: Self-pay | Admitting: Gastroenterology

## 2021-06-15 ENCOUNTER — Other Ambulatory Visit: Payer: Self-pay

## 2021-06-15 ENCOUNTER — Ambulatory Visit
Admission: RE | Admit: 2021-06-15 | Discharge: 2021-06-15 | Disposition: A | Discharge status: Home / Self Care | Payer: Medicare HMO | Attending: Gastroenterology | Admitting: Gastroenterology

## 2021-06-15 ENCOUNTER — Encounter
Admission: RE | Disposition: A | Discharge status: Home / Self Care | Payer: Self-pay | Source: Home / Self Care | Attending: Gastroenterology

## 2021-06-15 ENCOUNTER — Encounter: Payer: Self-pay | Admitting: Gastroenterology

## 2021-06-15 ENCOUNTER — Ambulatory Visit: Payer: Medicare HMO | Admitting: Anesthesiology

## 2021-06-15 DIAGNOSIS — R1310 Dysphagia, unspecified: Secondary | ICD-10-CM | POA: Diagnosis not present

## 2021-06-15 DIAGNOSIS — Z87891 Personal history of nicotine dependence: Secondary | ICD-10-CM | POA: Insufficient documentation

## 2021-06-15 DIAGNOSIS — K219 Gastro-esophageal reflux disease without esophagitis: Secondary | ICD-10-CM | POA: Insufficient documentation

## 2021-06-15 DIAGNOSIS — Z8349 Family history of other endocrine, nutritional and metabolic diseases: Secondary | ICD-10-CM | POA: Insufficient documentation

## 2021-06-15 DIAGNOSIS — Z803 Family history of malignant neoplasm of breast: Secondary | ICD-10-CM | POA: Diagnosis not present

## 2021-06-15 DIAGNOSIS — Z8379 Family history of other diseases of the digestive system: Secondary | ICD-10-CM | POA: Diagnosis not present

## 2021-06-15 DIAGNOSIS — Z79899 Other long term (current) drug therapy: Secondary | ICD-10-CM | POA: Diagnosis not present

## 2021-06-15 DIAGNOSIS — Z791 Long term (current) use of non-steroidal anti-inflammatories (NSAID): Secondary | ICD-10-CM | POA: Insufficient documentation

## 2021-06-15 DIAGNOSIS — Z833 Family history of diabetes mellitus: Secondary | ICD-10-CM | POA: Insufficient documentation

## 2021-06-15 HISTORY — PX: ESOPHAGOGASTRODUODENOSCOPY (EGD) WITH PROPOFOL: SHX5813

## 2021-06-15 SURGERY — ESOPHAGOGASTRODUODENOSCOPY (EGD) WITH PROPOFOL
Anesthesia: General

## 2021-06-15 MED ORDER — PROPOFOL 10 MG/ML IV BOLUS
INTRAVENOUS | Status: DC | PRN
  Administered 2021-06-15: 20 mg via INTRAVENOUS
  Administered 2021-06-15: 30 mg via INTRAVENOUS
  Administered 2021-06-15: 50 mg via INTRAVENOUS

## 2021-06-15 MED ORDER — PROPOFOL 10 MG/ML IV BOLUS
INTRAVENOUS | Status: AC
  Filled 2021-06-15: qty 20

## 2021-06-15 MED ORDER — SODIUM CHLORIDE 0.9 % IV SOLN: INTRAVENOUS | Status: DC

## 2021-06-15 MED ORDER — LIDOCAINE HCL (CARDIAC) PF 100 MG/5ML IV SOSY
PREFILLED_SYRINGE | INTRAVENOUS | Status: DC | PRN
  Administered 2021-06-15: 50 mg via INTRAVENOUS

## 2021-06-15 MED ORDER — PROPOFOL 500 MG/50ML IV EMUL
INTRAVENOUS | Status: AC
  Filled 2021-06-15: qty 50

## 2021-06-15 NOTE — Op Note (Signed)
[facility] Gastroenterology Patient Name: Suzanne Leon Procedure Date: 06/15/2021 9:51 AM MRN: [id number] Account #: [id number] Date of Birth: 1941-06-21 Admit Type: Outpatient Age: 80 Room: [facility] ENDO ROOM 1 Gender: Female Note Status: Finalized Procedure:             Upper GI endoscopy Indications:           Dysphagia Providers:             [name] MD, MD Referring MD:          No Local Md, MD (Referring MD) Medicines:             Monitored Anesthesia Care Complications:         No immediate complications. Procedure:             Pre-Anesthesia Assessment:                        - Prior to the procedure, a History and Physical was                         performed, and patient medications, allergies and                         sensitivities were reviewed. The patient's tolerance                         of previous anesthesia was reviewed.                        - The risks and benefits of the procedure and the                         sedation options and risks were discussed with the                         patient. All questions were answered and informed                         consent was obtained.                        - ASA Grade Assessment: III - A patient with severe                         systemic disease.                        After obtaining informed consent, the endoscope was                         passed under direct vision. Throughout the procedure,                         the patient's blood pressure, pulse, and oxygen                         saturations were monitored continuously. The Endoscope                         was introduced through the mouth, and advanced  to the                         third part of duodenum. The upper GI endoscopy was                         accomplished with ease. The patient tolerated the                         procedure well. Findings:      The examined duodenum was normal.      The stomach was normal.       The cardia and gastric fundus were normal on retroflexion.      The examined esophagus was normal. Biopsies were obtained from the       proximal and distal esophagus with cold forceps for histology of       suspected eosinophilic esophagitis. A TTS dilator was passed through the       scope. Dilation with a 15-16.5-18 mm balloon dilator was performed to 18       mm. The dilation site was examined and showed no change. Impression:            - Normal examined duodenum.                        - Normal stomach.                        - Normal esophagus. Biopsied. Dilated. Recommendation:        - Discharge patient to home (with escort).                        - Resume previous diet.                        - Continue present medications.                        - Await pathology results.                        - Return to my office as previously scheduled. Procedure Code(s):     --- Professional ---                        [id number], Esophagogastroduodenoscopy, flexible,                         transoral; with transendoscopic balloon dilation of                         esophagus (less than 30 mm diameter)                        43239, 59, Esophagogastroduodenoscopy, flexible,                         transoral; with biopsy, single or multiple Diagnosis Code(s):     --- Professional ---                        R13.10, Dysphagia, unspecified CPT copyright 2019 American Medical Association. All rights  reserved. The codes documented in this report are preliminary and upon coder review may  be revised to meet current compliance requirements. [name], MD [name] MD, MD 06/15/2021 10:09:39 AM This report has been signed electronically. Number of Addenda: 0 Note Initiated On: 06/15/2021 9:51 AM Estimated Blood Loss:  Estimated blood loss: none.      [facility]

## 2021-06-15 NOTE — Anesthesia Postprocedure Evaluation (Signed)
Anesthesia Post Note  Patient: Suzanne Leon  Procedure(s) Performed: ESOPHAGOGASTRODUODENOSCOPY (EGD) WITH PROPOFOL  Patient location during evaluation: Endoscopy Anesthesia Type: General Level of consciousness: awake and alert and oriented Pain management: pain level controlled Vital Signs Assessment: post-procedure vital signs reviewed and stable Respiratory status: spontaneous breathing, nonlabored ventilation and respiratory function stable Cardiovascular status: blood pressure returned to baseline and stable Postop Assessment: no signs of nausea or vomiting Anesthetic complications: no   No notable events documented.   Last Vitals:  Vitals:   06/15/21 1011 06/15/21 1031  BP: 132/65 (!) 162/70  Pulse: (!) 57   Resp: 14   Temp: (!) 36.3 C   SpO2: 100%     Last Pain:  Vitals:   06/15/21 1031  TempSrc:   PainSc: 0-No pain                 Wilmary Levit

## 2021-06-15 NOTE — Anesthesia Preprocedure Evaluation (Signed)
Anesthesia Evaluation  Patient identified by MRN, date of birth, ID band Patient awake    Reviewed: Allergy & Precautions, NPO status , Patient's Chart, lab work & pertinent test results  History of Anesthesia Complications Negative for: history of anesthetic complications  Airway Mallampati: II  TM Distance: <3 FB Neck ROM: Full    Dental no notable dental hx.    Pulmonary neg sleep apnea, neg COPD, former smoker,    breath sounds clear to auscultation- rhonchi (-) wheezing      Cardiovascular hypertension, (-) CAD, (-) Past MI, (-) Cardiac Stents and (-) CABG  Rhythm:Regular Rate:Normal - Systolic murmurs and - Diastolic murmurs    Neuro/Psych  Headaches, neg Seizures PSYCHIATRIC DISORDERS Anxiety Depression    GI/Hepatic Neg liver ROS, GERD  ,  Endo/Other  negative endocrine ROSneg diabetes  Renal/GU negative Renal ROS     Musculoskeletal  (+) Arthritis ,   Abdominal (+) - obese,   Peds  Hematology negative hematology ROS (+)   Anesthesia Other Findings Past Medical History: No date: Anxiety No date: Arthritis No date: Chronic headaches No date: Depression No date: GERD (gastroesophageal reflux disease) No date: HLD (hyperlipidemia) No date: Internal hemorrhoids No date: Osteoporosis No date: Skin cancer     Comment:  skin cancer   Reproductive/Obstetrics                             Anesthesia Physical Anesthesia Plan  ASA: 2  Anesthesia Plan: General   Post-op Pain Management:    Induction: Intravenous  PONV Risk Score and Plan: 2 and Propofol infusion  Airway Management Planned: Natural Airway  Additional Equipment:   Intra-op Plan:   Post-operative Plan:   Informed Consent: I have reviewed the patients History and Physical, chart, labs and discussed the procedure including the risks, benefits and alternatives for the proposed anesthesia with the patient or  authorized representative who has indicated his/her understanding and acceptance.     Dental advisory given  Plan Discussed with: CRNA and Anesthesiologist  Anesthesia Plan Comments:         Anesthesia Quick Evaluation

## 2021-06-15 NOTE — H&P (Signed)
[name], MD [address], [place], [place], [place], [address] [id number], [place], [place], [place], [address] Phone: [phone]  Fax: [phone]  Primary Care Physician:  Leanna Battles, MD   Pre-Procedure History & Physical: HPI:  Suzanne Leon is a 80 y.o. female is here for an endoscopy    Past Medical History:  Diagnosis Date   Anxiety    Arthritis    Chronic headaches    Depression    GERD (gastroesophageal reflux disease)    HLD (hyperlipidemia)    Internal hemorrhoids    Osteoporosis    Skin cancer    skin cancer    Past Surgical History:  Procedure Laterality Date   ABDOMINAL HYSTERECTOMY     APPENDECTOMY     COLONOSCOPY     ESOPHAGEAL MANOMETRY  12/18/2012   Procedure: ESOPHAGEAL MANOMETRY (EM);  Surgeon: Lafayette Dragon, MD;  Location: [facility];  Service: Endoscopy;  Laterality: N/A;   EYE SURGERY     LAMINECTOMY     TONSILLECTOMY      Prior to Admission medications   Medication Sig Start Date End Date Taking? Authorizing Provider  meloxicam (MOBIC) 15 MG tablet Take 15 mg by mouth daily.    [provider]  omeprazole (PRILOSEC) 40 MG capsule Take 1 capsule (40 mg total) by mouth daily. 06/11/21   [name], MD  propranolol (INDERAL) 10 MG tablet Take 10 mg by mouth daily as needed.    [provider]    Allergies as of 06/11/2021   (No Known Allergies)    Family History  Problem Relation Age of Onset   Breast cancer Daughter    Thyroid disease Daughter    Ulcers Father        peptic   Throat cancer Mother 81   Migraines Son    Diabetes Neg Hx     Social History   Socioeconomic History   Marital status: Widowed    Spouse name: Not on file   Number of children: 2   Years of education: Not on file   Highest education level: Not on file  Occupational History   Occupation: retired  Tobacco Use   Smoking status: Former    Pack years: 0.00    Types: Cigarettes   Smokeless tobacco: Never    Tobacco comments:    when she was a teenager  Scientific laboratory technician Use: Never used  Substance and Sexual Activity   Alcohol use: No   Drug use: No   Sexual activity: Not on file  Other Topics Concern   Not on file  Social History Narrative   Lives alone, independent   Walks regularly, rakes    Son, Coralyn Mark lives next door   One grandchild   Attends church   Psych: denies depressed mood or anxiety   Social Determinants of Radio broadcast assistant Strain: Not on file  Food Insecurity: Not on file  Transportation Needs: Not on file  Physical Activity: Not on file  Stress: Not on file  Social Connections: Not on file  Intimate Partner Violence: Not on file    Review of Systems: See HPI, otherwise negative ROS  Physical Exam: BP (!) 162/83   Pulse (!) 51   Temp (!) 96.3 F (35.7 C) (Temporal)   Resp 20   Ht 5\' 10"  (1.778 m)   Wt 54.4 kg   SpO2 100%   BMI 17.22 kg/m  General:  Alert,  pleasant and cooperative in NAD Head:  Normocephalic and atraumatic. Neck:  Supple; no masses or thyromegaly. Lungs:  Clear throughout to auscultation, normal respiratory effort.    Heart:  +S1, +S2, Regular rate and rhythm, No edema. Abdomen:  Soft, nontender and nondistended. Normal bowel sounds, without guarding, and without rebound.   Neurologic:  Alert and  oriented x4;  grossly normal neurologically.  Impression/Plan: Suzanne Leon is here for an endoscopy  to be performed for  evaluation of dysphagia    Risks, benefits, limitations, and alternatives regarding endoscopy have been reviewed with the patient.  Questions have been answered.  All parties agreeable.   [name], MD  06/15/2021, 9:52 AM

## 2021-06-15 NOTE — Transfer of Care (Signed)
Immediate Anesthesia Transfer of Care Note  Patient: Suzanne Leon  Procedure(s) Performed: ESOPHAGOGASTRODUODENOSCOPY (EGD) WITH PROPOFOL  Patient Location: PACU and Endoscopy Unit  Anesthesia Type:General  Level of Consciousness: awake, drowsy and patient cooperative  Airway & Oxygen Therapy: Patient Spontanous Breathing  Post-op Assessment: Report given to RN and Post -op Vital signs reviewed and stable  Post vital signs: Reviewed and stable  Last Vitals:  Vitals Value Taken Time  BP 132/65 06/15/21 1012  Temp 36.3 C 06/15/21 1011  Pulse 62 06/15/21 1014  Resp 13 06/15/21 1014  SpO2 100 % 06/15/21 1014  Vitals shown include unvalidated device data.  Last Pain:  Vitals:   06/15/21 1011  TempSrc: Temporal  PainSc: 0-No pain         Complications: No notable events documented.

## 2021-06-16 ENCOUNTER — Encounter: Payer: Self-pay | Admitting: Gastroenterology

## 2021-06-16 LAB — SURGICAL PATHOLOGY

## 2021-06-17 ENCOUNTER — Telehealth: Payer: Self-pay

## 2021-06-17 NOTE — Telephone Encounter (Signed)
Lmovm w/ special procedures to schedule modified barium swallow

## 2021-06-17 NOTE — Telephone Encounter (Signed)
Pt declines having modified barium swallow at this time

## 2021-06-17 NOTE — Telephone Encounter (Signed)
Ok

## 2021-06-17 NOTE — Progress Notes (Signed)
No abnormality seen in the biopsies of the esophagus

## 2021-06-19 ENCOUNTER — Other Ambulatory Visit: Payer: Self-pay

## 2021-06-22 ENCOUNTER — Other Ambulatory Visit: Payer: Self-pay

## 2021-06-22 ENCOUNTER — Ambulatory Visit: Payer: Medicare HMO | Admitting: Gastroenterology

## 2021-06-22 ENCOUNTER — Encounter: Payer: Self-pay | Admitting: Gastroenterology

## 2021-06-22 VITALS — BP 150/84 | HR 69 | Temp 98.4°F | Wt 121.4 lb

## 2021-06-22 DIAGNOSIS — R1310 Dysphagia, unspecified: Secondary | ICD-10-CM

## 2021-06-22 NOTE — Progress Notes (Signed)
[name] MD, MRCP(U.K) [address]  [place]  [place], [place] [address]  Main: [phone]  Fax: [phone]   Primary Care Physician: Leanna Battles, MD  Primary Gastroenterologist:  Dr. [name]    Chief complaint: Dysphagia follow-up   HPI: Suzanne Leon is a 80 y.o. female   Summary of history :  She was initially referred and seen on 06/14/2021 for dysphagia.She has had issues with swallowing for many years at least from 2013 .   Of late she states it is getting harder for her to swallow she complains of a dry mouth making it hard to swallow, at times when she is having her food particularly solids it goes the wrong way and makes her cough.  She does complain that food gets stuck in her neck.  She always has a sensation of something hanging around in the neck.  Denies any vomiting.  Does have heartburn with certain foods.  Not on any PPI.  Has lost weight due to inability to eat.   In 2014 she underwent an esophageal manometry study and only abnormality seen was increased LES pressure.  An upper endoscopy into December 2013 demonstrated no clear stricture but she was empirically dilated to 23 [place] underwent barium swallow in May 2021 that showed no abnormality.  CT chest at the same time also showed no abnormality of the esophagus.  She has a history of GERD but has had issues with solid food dysphagia for many years.  In the past no biopsies taken to rule out eosinophilic esophagitis.         Interval history   06/11/2021-06/22/2021  06/15/2021: EGD: Esophagus appeared normal.  Biopsies were taken.  Empirically dilated to 18 mm.  No evidence of eosinophils was seen in the esophagus biopsies   She is here today with her son.  States she still has some issues with food getting stuck at the back of her throat. 4 months  Current Outpatient Medications  Medication Sig Dispense Refill   amLODipine (NORVASC) 2.5 MG tablet Take 2.5 mg by mouth daily.      propranolol (INDERAL) 10 MG tablet Take 10 mg by mouth daily as needed.     meloxicam (MOBIC) 15 MG tablet Take 15 mg by mouth daily. (Patient not taking: Reported on 06/22/2021)     omeprazole (PRILOSEC) 40 MG capsule Take 1 capsule (40 mg total) by mouth daily. (Patient not taking: Reported on 06/22/2021) 30 capsule 1   No current facility-administered medications for this visit.    Allergies as of 06/22/2021   (No Known Allergies)    ROS:  General: Negative for anorexia, weight loss, fever, chills, fatigue, weakness. ENT: Negative for hoarseness, difficulty swallowing , nasal congestion. CV: Negative for chest pain, angina, palpitations, dyspnea on exertion, peripheral edema.  Respiratory: Negative for dyspnea at rest, dyspnea on exertion, cough, sputum, wheezing.  GI: See history of present illness. GU:  Negative for dysuria, hematuria, urinary incontinence, urinary frequency, nocturnal urination.  Endo: Negative for unusual weight change.    Physical Examination:   BP (!) 150/84   Pulse 69   Temp 98.4 F (36.9 C) (Oral)   Wt 121 lb 6.4 oz (55.1 kg)   BMI 17.42 kg/m   General: Well-nourished, well-developed in no acute distress.  Eyes: No icterus. Conjunctivae pink. Neuro: Alert and oriented x 3.  Grossly intact. Skin: Warm and dry, no jaundice.   Psych: Alert and cooperative, normal mood and affect.  Imaging Studies: No results found.  Assessment and Plan:   Suzanne Leon is a 80 y.o. y/o female here to follow-up for dysphagia.  History is very suggestive of possible dry mouth making it hard to swallow, an element of transfer dysphagia due to history of coughing.  Achalasia is also part of the differentials although the barium swallow in the past appeared to be normal.  06/11/2021 EGD with performed no evidence of eosinophilic esophagitis and empirically dilated to 18 mm.  Still has some difficulty with food getting stuck in the back of her throat possible transfer  dysphagia.  Plan 1.  Continue PPI 2.    Modified barium swallow to rule out transfer dysphagia if negative may need esophageal manometric. 3.  Continue Ensure, adequate lubrication during meals.  We will discuss about her weight if stable at next visit.  She is very reluctant to undergo any testing today but we are going to schedule for modified barium swallow and have advised her to cancel it if she wishes to do so  Dr [name]  MD,MRCP [facility]) Follow up in 4 months

## 2021-06-22 NOTE — Patient Instructions (Signed)
Please eat your regular breakfast and a light lunch the day of the exam.

## 2021-07-02 DIAGNOSIS — R55 Syncope and collapse: Secondary | ICD-10-CM | POA: Diagnosis not present

## 2021-07-02 DIAGNOSIS — I2510 Atherosclerotic heart disease of native coronary artery without angina pectoris: Secondary | ICD-10-CM | POA: Diagnosis not present

## 2021-07-02 DIAGNOSIS — E440 Moderate protein-calorie malnutrition: Secondary | ICD-10-CM | POA: Diagnosis not present

## 2021-07-02 DIAGNOSIS — R1312 Dysphagia, oropharyngeal phase: Secondary | ICD-10-CM | POA: Diagnosis not present

## 2021-07-02 DIAGNOSIS — I10 Essential (primary) hypertension: Secondary | ICD-10-CM | POA: Diagnosis not present

## 2021-07-09 ENCOUNTER — Other Ambulatory Visit: Payer: Self-pay

## 2021-07-09 ENCOUNTER — Ambulatory Visit: Payer: Medicare HMO | Admitting: Cardiology

## 2021-07-09 ENCOUNTER — Encounter: Payer: Self-pay | Admitting: Cardiology

## 2021-07-09 VITALS — BP 144/76 | HR 65 | Temp 98.6°F | Resp 16 | Ht 70.0 in | Wt 121.6 lb

## 2021-07-09 DIAGNOSIS — I10 Essential (primary) hypertension: Secondary | ICD-10-CM | POA: Diagnosis not present

## 2021-07-09 DIAGNOSIS — I444 Left anterior fascicular block: Secondary | ICD-10-CM | POA: Diagnosis not present

## 2021-07-09 DIAGNOSIS — R001 Bradycardia, unspecified: Secondary | ICD-10-CM | POA: Diagnosis not present

## 2021-07-09 DIAGNOSIS — R55 Syncope and collapse: Secondary | ICD-10-CM | POA: Diagnosis not present

## 2021-07-09 NOTE — Progress Notes (Signed)
Primary Physician:  Leanna Battles, MD   Patient ID: Suzanne Leon, female    DOB: [birth year], 80 y.o.   MRN: [id number]  Subjective:    Chief Complaint  Patient presents with   Loss of Consciousness    HPI: Suzanne Leon  is a 80 y.o. female  HTN and HLD, not on therapy for either, was started on amlodipine, but developed severe dizziness, ARB caused fatigue. She also has chronic generalized tremors for which she takes propranolol. Continues to have difficulty in swallowing and neck discomfort (ongoing since 2017). She had also lost about 50 pounds in weight due to dysphagia. However GI work-up and ENT w/u was unremarkable.  Her weight is now stable in the past 6 months. Continues to state she has something hard in her throat and has difficulty with swallowing.  The patient presents today for evaluation of unwitnessed syncope that occurred around 2-3 weeks ago. She has never had a syncopal episode prior to this. It was midday and she was walking through her house when her vision went dark for an unknown amount of time and she fell and woke up on the floor with epistaxis. She denies any pre-syncopal symptoms. She states that she returned to baseline after the syncope and resumed her normal daily activities. She has not had any syncope after this episode.   The patient and her son, who accompanied her to the visit today, described that the patient had been on amlodipine at the time around the syncopal event and the patient had been experiencing decreased energy and dizziness while on the medication. The patient is unsure if she took amlodipine on the day of her syncopal episode. She takes propranolol twice a week before church on Wednesday and on Sunday. She reports being home all day on the day of the syncope so it is not likely that she had taken 10 mg propranolol on the day she had syncope.  The patient does monitor her blood pressure at home. Her son describes it as being low in the  morning below 120/80. It typically increases during the day. The patient cannot really recall any specific values during the visit today.The patient is currently not taking any blood pressure medicine. She is taking propranolol for essential tremor.  The patient has a history of recurrent dizzy episodes from chronic vertigo that has been evaluated in the past. Meclizine and Dramamine do not help with the vertigo. Denies associated hearing loss. Denies falls related to vertigo prior to the syncopal episode. The patient is currently having vertigo that started on Tuesday, she does endorse feeling better today.  The patient's physical activity includes raking, and walking, mowing, walking a mile and a nearby field when she feels well.  She does endorse drinking a lot of water.  Denies chest pain, palpitations, worsening SOB, DOE, orthopnea, PND, swelling, claudication.   Past Medical History:  Diagnosis Date   Anxiety    Arthritis    Chronic headaches    Depression    GERD (gastroesophageal reflux disease)    HLD (hyperlipidemia)    Internal hemorrhoids    Osteoporosis    Skin cancer    skin cancer    Past Surgical History:  Procedure Laterality Date   ABDOMINAL HYSTERECTOMY     APPENDECTOMY     COLONOSCOPY     ESOPHAGEAL MANOMETRY  12/18/2012   Procedure: ESOPHAGEAL MANOMETRY (EM);  Surgeon: Lafayette Dragon, MD;  Location: [facility];  Service: Endoscopy;  Laterality: N/A;  ESOPHAGOGASTRODUODENOSCOPY (EGD) WITH PROPOFOL N/A 06/15/2021   Procedure: ESOPHAGOGASTRODUODENOSCOPY (EGD) WITH PROPOFOL;  Surgeon: [name], MD;  Location: [facility]COPY;  Service: Gastroenterology;  Laterality: N/A;   EYE SURGERY     LAMINECTOMY     TONSILLECTOMY      Social History   Socioeconomic History   Marital status: Widowed    Spouse name: Not on file   Number of children: 2   Years of education: Not on file   Highest education level: Not on file  Occupational History   Occupation: retired   Tobacco Use   Smoking status: Former    Packs/day: 0.25    Types: Cigarettes    Quit date: 1960    Years since quitting: 62.6   Smokeless tobacco: Never   Tobacco comments:    when she was a teenager  Scientific laboratory technician Use: Never used  Substance and Sexual Activity   Alcohol use: No   Drug use: No   Sexual activity: Not on file  Other Topics Concern   Not on file  Social History Narrative   Lives alone, independent   Walks regularly, rakes    Son, Coralyn Mark lives next door   One grandchild   Attends church   Psych: denies depressed mood or anxiety   Social Determinants of Radio broadcast assistant Strain: Not on file  Food Insecurity: Not on file  Transportation Needs: Not on file  Physical Activity: Not on file  Stress: Not on file  Social Connections: Not on file  Intimate Partner Violence: Not on file    Review of Systems  Constitutional: Negative for malaise/fatigue.  HENT:  Negative for hearing loss. Sore throat: difficulty swallowing.  Eyes:  Negative for visual disturbance.  Cardiovascular:  Positive for syncope. Negative for chest pain, claudication, dyspnea on exertion, leg swelling, near-syncope, orthopnea and palpitations.  Respiratory:  Negative for shortness of breath.   Skin:  Negative for nail changes.  Musculoskeletal:  Positive for joint pain and joint swelling.  Gastrointestinal:  Negative for abdominal pain.  Neurological:  Positive for dizziness.  Psychiatric/Behavioral:  Positive for memory loss.   All other systems reviewed and are negative.    Objective:   Vitals with BMI 07/09/2021 06/22/2021 06/15/2021  Height 5' 10" - -  Weight 121 lbs 10 oz 121 lbs 6 oz -  BMI 81.01 75.10 -  Systolic 258 527 782  Diastolic 76 84 70  Pulse 65 69 -    Physical Exam Constitutional:      Comments: Moderately built and petite in no acute distress.  Generalized tremors and head bobbing noted.   HENT:     Head: Normocephalic and atraumatic.   Cardiovascular:     Rate and Rhythm: Normal rate and regular rhythm.     Pulses: Intact distal pulses.     Heart sounds: Normal heart sounds.  Pulmonary:     Effort: Pulmonary effort is normal. No accessory muscle usage or respiratory distress.     Breath sounds: Normal breath sounds. No wheezing or rales.  Abdominal:     General: Bowel sounds are normal.     Palpations: Abdomen is soft.     Tenderness: There is no abdominal tenderness.  Musculoskeletal:        General: Deformity (in the DIP joint) present.     Cervical back: Normal range of motion.     Right lower leg: No edema.     Left lower leg: No edema.  Skin:    General: Skin is warm and dry.  Neurological:     Mental Status: She is alert.   Orthostatic VS for the past 72 hrs (Last 3 readings):  Orthostatic BP Patient Position BP Location Cuff Size Orthostatic Pulse  07/09/21 1414 147/74 Standing Left Arm Normal 64  07/09/21 1412 141/79 Sitting Left Arm Normal 55  07/09/21 1411 141/68 Supine Left Arm Normal 54    Radiology: No results found.  MR Brain WO Contrast 12/20/2020 1. No acute abnormality. 2. Remote right cerebellar infarct. 3. Moderate chronic microvascular ischemic disease.  Laboratory examination:    CMP Latest Ref Rng & Units 12/05/2020 03/29/2019 09/26/2018  Glucose 70 - 99 mg/dL 91 102(H) -  BUN 8 - 23 mg/dL 15 18 17  Creatinine 0.44 - 1.00 mg/dL 0.95 1.18(H) 0.9  Sodium 135 - 145 mmol/L 134(L) 143 143  Potassium 3.5 - 5.1 mmol/L 4.0 4.3 4.1  Chloride 98 - 111 mmol/L 101 106 -  CO2 22 - 32 mmol/L 21(L) 26 -  Calcium 8.9 - 10.3 mg/dL 8.3(L) 9.5 -  Alkaline Phos 25 - 125 - - 72  AST 13 - 35 - - 22  ALT 7 - 35 - - 16   CBC Latest Ref Rng & Units 12/05/2020 03/29/2019 09/26/2018  WBC 4.0 - 10.5 K/uL 4.6 5.8 4.0  Hemoglobin 12.0 - 15.0 g/dL 12.8 12.5 11.9(A)  Hematocrit 36.0 - 46.0 % 41.3 39.2 38  Platelets 150 - 400 K/uL 109(L) 160 -   Lipid Panel     Component Value Date/Time   CHOL 250  (A) 09/26/2018 0000   TRIG 66 09/26/2018 0000   HDL 79 (A) 09/26/2018 0000   LDLCALC 158 09/26/2018 0000   HEMOGLOBIN A1C No results found for: HGBA1C, MPG TSH No results for input(s): TSH in the last 8760 hours.   PRN Meds:. Medications Discontinued During This Encounter  Medication Reason   omeprazole (PRILOSEC) 40 MG capsule Error   amLODipine (NORVASC) 2.5 MG tablet Error   Current Meds  Medication Sig   meloxicam (MOBIC) 15 MG tablet Take 15 mg by mouth daily.   propranolol (INDERAL) 10 MG tablet Take 10 mg by mouth daily as needed.    External labs  Labs on 10/16/2020 CMP: Sodium 140, potassium 4.5, creatinine 0.9, BUN 17, glucose 96, AST 28, ALT 16, alk phos 97 CBC: Hemoglobin 13.4, HCT 40.6, MCV 96.1, platelets 163 Lipid panel: LDL 153, HDL 93, triglycerides 54, total cholesterol 257 TSH 1.82 Free T4 1.0 Vitamin D 26.5  Labs on 12/16/2020 BMP: Sodium 137, potassium 4.6, creatinine 0.8, BUN 13, glucose 99, EGFR 69.2 CBC: Hemoglobin 13.5, HCT 40.1, MCV 92.3, platelets 369 Hemoccult negative  Cardiac Studies:   Treadmill exercise stress test 06/28/2016: Indications: Shortness of breath, Abn ECG The resting electrocardiogram demonstrated normal sinus rhythm, normal resting conduction, no resting arrhythmias and normal rest repolarization.  The stress electrocardiogram was normal.  Upsloping ST depression with exercise back to baseline at < 80mSec of QRS. There were no significant arrhythmias. Patient exercised on Bruce protocol for  5:00 minutes and achieved  93% of Max Predicted HR (Target HR was >85% MPHR) and  7.02 METS. Stress symptoms included fatigue and dyspnea. Normal BP response.   Impression: Normal stress EKG.  Low normal exercise capacity for age.  CT angiogram of the chest and abdomen 10/22/2019: Chest: 1. 7 mm solid spiculated nodule in the posterior segment left upper lobe. Could consider short-term interval follow-up given patient's history   of prior  malignancy and clinical presentation versus PET-CT or tissue sampling. 2. Cardiomegaly with biatrial enlargement. Coronary atherosclerosis. 3. Central pulmonary artery enlargement compatible with pulmonary artery hypertension. 4. Mild compressive effect upon the right heart by a pectus deformity of the chest wall. 5. Aortic Atherosclerosis (ICD10-I70.0). Abdomen: 1. No acute intra-abdominal process . 2. Mild skin thickening and subcutaneous stranding superficial to the sacrum. Correlate with physical examination to exclude decubitus skin breakdown. 3. Paucity of intraperitoneal and subcutaneous fat compatible with reported history of weight loss.  Echocardiogram 11/09/2019: Left ventricle cavity is normal in size. Normal left ventricular wall thickness. Normal LV systolic function with EF 58%. Normal global wall motion. Normal diastolic filling pattern.  Left atrial cavity is mildly dilated. Trileaflet aortic valve.  Mild aortic regurgitation. Moderate (Grade II) mitral regurgitation. Mild prolapse of the mitral valve leaflets. Moderate tricuspid regurgitation. Moderate pulmonary hypertension. Estimated pulmonary artery systolic pressure is 45 mmHg.  Mild pulmonic regurgitation. IVC is dilated with blunted respiratory response. Estimated RA pressure 10-15 mmHg. Compared to previous study, mild aortic regurgitation, elevated right atrial pressure and moderate pulmonary hypertension are new findings.   Lexiscan Tetrofosmin stress test 11/05/2019: No previous exam available for comparison. Lexiscan nuclear stress test performed using 1-day protocol. Myocardial perfusion imaging is normal. Left ventricular ejection fraction is  59% with normal wall motion. Low risk study.  Assessment:     ICD-10-CM   1. Syncope and collapse  R55 EKG 12-Lead    LONG TERM MONITOR (3-14 DAYS)    2. Sinus bradycardia  R00.1     3. LAFB (left anterior fascicular block)  I44.4     4. Primary hypertension  I10       EKG 07/09/2021: Sinus bradycardia with sinus arrhythmia at the rate of 54 bpm, left axis deviation, left anterior fascicular block.  Poor R wave progression, probably normal variant but cannot exclude anteroseptal infarct old.  No evidence of ischemia, normal QT interval. No significant change from 10/31/2019 and 07/23/2019. HR previously 61/min.   EKG 10/31/2019: Normal sinus rhythm at the rate of 61 bpm, left atrial abnormality, left axis deviation, left plantar fascicular block.  Incomplete right bundle branch block.  Poor R-wave progression, cannot exclude anteroseptal infarct old.  PVCs (2).  Nonspecific T normality.  Abnormal EKG. No significant change from EKG 07/23/2019.   Recommendations:   Ginna B Ditto  is a 80 y.o. female  with  HTN and HLD, not on therapy for either, was started on amlodipine, but developed severe dizziness, ARB caused fatigue. She also has chronic generalized tremors and head bobbing for which she takes propranolol. Continues to have difficulty in swallowing and neck discomfort (ongoing since 2017) and now being evaluated by Dr. Donika Patel (Neuro). She had also lost about 50 pounds in weight due to dysphagia.  However GI work-up and ENT w/u was unremarkable.  Her weight is now stable in the past 6 months.   Patient presents today for evaluation of a syncopal episode. Based on EKG showing sinus bradycardia and left anterior fascicular block, suspicious for a cardiac conduction cause of the syncope. 14-day event monitor was placed.  If the event monitor does not show any findings, loop recorder will be placed. The patient verbalizes understanding of this plan.  Due to the concern for cardiac cause of syncope, the patient was advised to not drive for 6 months. The patient was educated on how driving could be a safety concern. The patient and her son verbalized understanding.     Follow up in 1 month for syncope.  Adline Potter, PA-S 07/09/2021, 3:45 PM [facility] [place] Office: [phone]

## 2021-07-13 ENCOUNTER — Other Ambulatory Visit: Payer: Self-pay

## 2021-07-13 ENCOUNTER — Ambulatory Visit
Admission: RE | Admit: 2021-07-13 | Discharge: 2021-07-13 | Disposition: A | Discharge status: Home / Self Care | Payer: Medicare HMO | Source: Ambulatory Visit | Attending: Gastroenterology | Admitting: Gastroenterology

## 2021-07-13 DIAGNOSIS — R1310 Dysphagia, unspecified: Secondary | ICD-10-CM | POA: Insufficient documentation

## 2021-07-13 NOTE — Therapy (Signed)
[place], [place], [address] Phone: [phone]   Fax:     Modified Barium Swallow  Patient Details  Name: Suzanne Leon MRN: [id number] Date of Birth: [birth year] No data recorded  Encounter Date: 07/13/2021   End of Session - 07/13/21 1653     Visit Number 1    Number of Visits 1    Date for SLP Re-Evaluation 07/13/21    SLP Start Time 1250    SLP Stop Time  1320    SLP Time Calculation (min) 30 min    Activity Tolerance Patient tolerated treatment well               There were no vitals filed for this visit.   Subjective Assessment - 07/13/21 1653     Subjective Reports difficulty swallowing since 2013.               Objective Swallowing Evaluation: Type of Study: MBS-Modified Barium Swallow Study   Patient Details  Name: Suzanne Leon MRN: [id number] Date of Birth: [birth year]  Today's Date: 07/13/2021 Time: SLP Start Time (ACUTE ONLY): 1250 -SLP Stop Time (ACUTE ONLY): 1320  SLP Time Calculation (min) (ACUTE ONLY): 30 min   Past Medical History:  Past Medical History:  Diagnosis Date   Anxiety    Arthritis    Chronic headaches    Depression    GERD (gastroesophageal reflux disease)    HLD (hyperlipidemia)    Internal hemorrhoids    Osteoporosis    Skin cancer    skin cancer   Past Surgical History:  Past Surgical History:  Procedure Laterality Date   ABDOMINAL HYSTERECTOMY     APPENDECTOMY     COLONOSCOPY     ESOPHAGEAL MANOMETRY  12/18/2012   Procedure: ESOPHAGEAL MANOMETRY (EM);  Surgeon: Lafayette Dragon, MD;  Location: [facility];  Service: Endoscopy;  Laterality: N/A;   ESOPHAGOGASTRODUODENOSCOPY (EGD) WITH PROPOFOL N/A 06/15/2021   Procedure: ESOPHAGOGASTRODUODENOSCOPY (EGD) WITH PROPOFOL;  Surgeon: [name], MD;  Location: [facility] END[facility]COPY;  Service: Gastroenterology;  Laterality: N/A;   EYE SURGERY     LAMINECTOMY     TONSILLECTOMY      HPI: Suzanne Leon is an 80 y.o. female with past medical history noted for hypertension, hyperlipidemia, GERD, and anxiety, referred by Dr. Vicente Males for MBS. Lengthy history of dysphagia reported to solid foods; esophageal manometry in 2014 showed increased LES pressure; hx of esophageal dilation (2013, 2022). Barium swallow 04/25/2020 was normal; no evidence of reflux, stricture or dysmotility. FEES at [facility] 09/26/18 showed "slight vs mild pharyngeal dysphagia of unknown etiology, likely presbyphagia." Also noted were laryngeal tremors, vocal fold atrophy and right vocal fold hypomobility. Underwent neurological workup with Dr. Narda Amber; MRI brain/spine, NCS/EMG did not show neurological basis for symptoms.   Subjective: alert; head tremors noted    Assessment / Plan / Recommendation  CHL IP CLINICAL IMPRESSIONS 07/13/2021  Clinical Impression Patient presents with mild impairments in swallow function which appear consistent with normative aging and degenerative changes of the cervical spine. Oral stage is characterized by adequate lip closure and tongue control with most consistencies; however discoordination present with anterior spillage with multiple consecutive sips of thin liquid, appears related to head tremor. Prolonged chewing, slow lingual transport with mild residue collection on lingual surface. Pt senses and completes a second swallow independently. Swallow initiation occurs at the valleculae for solids, and at the laryngeal surface of the  epiglottis for liquids. The pharyngeal space is narrowed; degenerative changes noted in cervical spine per MRI. Pt tilts head forward, appearing to use this maneuver to exert additional pressure on the tail of the bolus during the swallow; despite this there is mild collection of residue in the valleculae; as pt initiates her second swallow, this reduces. Epiglottic deflection is incomplete with thin/lighter bolus as  epiglottis abuts the posterior pharyngeal wall; with heavier boluses epiglottis deflects completely. There is transient penetration with multiple consecutive sips of thin liquid due to discoordinated timing. No aspiration was observed on this exam. Cervical esophageal phase was noted for reduced amplitude of opening, however this did not significantly impact bolus flow. A sweep of the proximal esophagus was performed in the upright position which revealed full clearance of contrast. Overall pt's oropharyngeal swallowing appears functional; when pt reported symptoms of globus, this did not coordinate with presence of any residual in the pharynx. The patient and her son were educated using study images for teachback. Recommend regular diet and thin liquids; no further ST indicated. MD may wish to consider further assessment of esophageal function such as manometry.  SLP Visit Diagnosis Dysphagia, oropharyngeal phase (R13.12)  Impact on safety and function Mild aspiration risk      CHL IP TREATMENT RECOMMENDATION 07/13/2021  Treatment Recommendations No treatment recommended at this time     Prognosis 07/13/2021  Prognosis for Safe Diet Advancement Good  Barriers to Reach Goals --  Barriers/Prognosis Comment --    CHL IP DIET RECOMMENDATION 07/13/2021  SLP Diet Recommendations Regular solids;Thin liquid  Liquid Administration via Cup  Medication Administration Whole meds with liquid  Compensations Slow rate;Small sips/bites;Multiple dry swallows after each bite/sip  Postural Changes --      CHL IP OTHER RECOMMENDATIONS 07/13/2021  Recommended Consults Consider esophageal assessment  Oral Care Recommendations Oral care BID  Other Recommendations --      CHL IP FOLLOW UP RECOMMENDATIONS 07/13/2021  Follow up Recommendations None      No flowsheet data found.         CHL IP ORAL PHASE 07/13/2021  Oral Phase Impaired  Oral - Nectar Teaspoon Reduced posterior propulsion;Lingual/palatal residue   Oral - Nectar Cup Reduced posterior propulsion;Lingual/palatal residue  Oral - Nectar Straw --  Oral - Thin Teaspoon Reduced posterior propulsion;Lingual/palatal residue  Oral - Thin Cup Reduced posterior propulsion;Lingual/palatal residue  Oral - Thin Straw --  Oral - Puree Reduced posterior propulsion;Lingual/palatal residue  Oral - Mech Soft Reduced posterior propulsion;Lingual/palatal residue    CHL IP PHARYNGEAL PHASE 07/13/2021  Pharyngeal Phase Impaired  Pharyngeal- Nectar Teaspoon Pharyngeal residue - valleculae;Reduced tongue base retraction  Pharyngeal Material does not enter airway  Pharyngeal- Nectar Cup Reduced tongue base retraction;Pharyngeal residue - valleculae  Pharyngeal Material does not enter airway  Pharyngeal- Nectar Straw --  Pharyngeal --  Pharyngeal- Thin Teaspoon Reduced epiglottic inversion;Reduced anterior laryngeal mobility;Reduced tongue base retraction;Pharyngeal residue - valleculae  Pharyngeal Material does not enter airway  Pharyngeal- Thin Cup Reduced epiglottic inversion;Reduced anterior laryngeal mobility;Pharyngeal residue - valleculae  Pharyngeal Material does not enter airway;Material enters airway, remains ABOVE vocal cords then ejected out  Pharyngeal- Thin Straw --  Pharyngeal --  Pharyngeal- Puree Reduced tongue base retraction;Pharyngeal residue - valleculae  Pharyngeal Material does not enter airway  Pharyngeal- Mechanical Soft Reduced tongue base retraction;Pharyngeal residue - valleculae     CHL IP CERVICAL ESOPHAGEAL PHASE 07/13/2021  Cervical Esophageal Phase Impaired  Cervical Esophageal Comment Reduced amplitude of opening   Stanton Kidney  Rhunette Croft, [place], CCC-SLP Speech-Language Pathologist   Aliene Altes 07/13/2021, 5:56 PM                                             Patient will benefit from skilled therapeutic intervention in order to improve the following deficits and impairments:   Dysphagia,  unspecified type - Plan: DG SWALLOW FUNC SPEECH PATH, DG SWALLOW [place]        Problem List Patient Active Problem List   Diagnosis Date Noted   Educated about COVID-19 virus infection 04/02/2019   PVC (premature ventricular contraction) 04/02/2019   Globus sensation 10/19/2018   Essential hypertension 10/19/2018   Weight loss 10/19/2018   Alopecia totalis 10/27/2016   Insomnia 10/27/2016   Essential tremor 10/27/2016    Aliene Altes 07/13/2021, 5:55 PM  [facility] [place], [place], [address] Phone: [phone]   Fax:     Name: AYLINE FABRIZIO MRN: [id number] Date of Birth: 1941-05-11

## 2021-07-16 ENCOUNTER — Ambulatory Visit (HOSPITAL_COMMUNITY)
Admission: EM | Admit: 2021-07-16 | Discharge: 2021-07-16 | Disposition: A | Discharge status: Home / Self Care | Payer: Medicare HMO

## 2021-07-16 ENCOUNTER — Other Ambulatory Visit: Payer: Self-pay | Admitting: Cardiology

## 2021-07-16 ENCOUNTER — Encounter (HOSPITAL_COMMUNITY): Payer: Self-pay

## 2021-07-16 ENCOUNTER — Other Ambulatory Visit: Payer: Self-pay

## 2021-07-16 DIAGNOSIS — R42 Dizziness and giddiness: Secondary | ICD-10-CM | POA: Diagnosis not present

## 2021-07-16 DIAGNOSIS — R55 Syncope and collapse: Secondary | ICD-10-CM

## 2021-07-16 NOTE — ED Triage Notes (Signed)
Pt reports she feels like she is spinning, headache and diarrhea for a week. Pt had a fall a few weeks ago. Pt states urinating more than usual. Pt c/o having a cyst in right ear.

## 2021-07-16 NOTE — ED Provider Notes (Addendum)
Suzanne Leon    CSN: [id number] Arrival date & time: 07/16/21  1018      History   Chief Complaint Chief Complaint  Patient presents with   Dizziness    HPI Suzanne Leon is a 80 y.o. female.   Pts son brought in pt. She has had chronic dizziness. 07/09/2021 was seen by cardiology when pt had dizziness became syncope and fell. Pt had an ekg done and was schedule to have a Holter monitor placed on Monday. Pt still c/o of the dizziness takes dramamine for this, use to take meclizine with no relief. Takes a beta blocker on wed and sun intermit to help with this and to help with shaking. Pt has seen neurology for this and several other MD with no resolution. Pt was tested for parkinson's and was told it was not shown. Pt also c/o of hollow fluid feeling in her ears. Son states that it is hard for her to get up in the am when they check her pressure it is low and it takes some time to get moving. Denies any chest pain, no sob, no headaches.    Past Medical History:  Diagnosis Date   Anxiety    Arthritis    Chronic headaches    Depression    GERD (gastroesophageal reflux disease)    HLD (hyperlipidemia)    Internal hemorrhoids    Osteoporosis    Skin cancer    skin cancer    Patient Active Problem List   Diagnosis Date Noted   Educated about COVID-19 virus infection 04/02/2019   PVC (premature ventricular contraction) 04/02/2019   Globus sensation 10/19/2018   Essential hypertension 10/19/2018   Weight loss 10/19/2018   Alopecia totalis 10/27/2016   Insomnia 10/27/2016   Essential tremor 10/27/2016    Past Surgical History:  Procedure Laterality Date   ABDOMINAL HYSTERECTOMY     APPENDECTOMY     COLONOSCOPY     ESOPHAGEAL MANOMETRY  12/18/2012   Procedure: ESOPHAGEAL MANOMETRY (EM);  Surgeon: Lafayette Dragon, MD;  Location: [facility] ENDOSCOPY;  Service: Endoscopy;  Laterality: N/A;   ESOPHAGOGASTRODUODENOSCOPY (EGD) WITH PROPOFOL N/A 06/15/2021   Procedure:  ESOPHAGOGASTRODUODENOSCOPY (EGD) WITH PROPOFOL;  Surgeon: [name], MD;  Location[facility] ENDOSCOPY;  Service: Gastroenterology;  Laterality: N/A;   EYE SURGERY     LAMINECTOMY     TONSILLECTOMY      OB History   No obstetric history on file.      Home Medications    Prior to Admission medications   Medication Sig Start Date End Date Taking? Authorizing Provider  propranolol (INDERAL) 10 MG tablet Take 10 mg by mouth daily as needed.   Yes [provider]  meloxicam (MOBIC) 15 MG tablet Take 15 mg by mouth daily.    [provider]    Family History Family History  Problem Relation Age of Onset   Throat cancer Mother 45   Ulcers Father        peptic   Breast cancer Daughter    Thyroid disease Daughter    Migraines Son    Diabetes Neg Hx     Social History Social History   Tobacco Use   Smoking status: Former    Packs/day: 0.25    Types: Cigarettes    Quit date: 1960    Years since quitting: 62.6   Smokeless tobacco: Never   Tobacco comments:    when she was a teenager  Vaping Use   Vaping Use:  Never used  Substance Use Topics   Alcohol use: No   Drug use: No     Allergies   Patient has no known allergies.   Review of Systems Review of Systems  Constitutional:  Positive for activity change. Negative for fatigue and fever.  HENT:  Negative for postnasal drip, rhinorrhea, sinus pressure, sneezing and sore throat.        Fullness and full sound in ears   Respiratory: Negative.    Cardiovascular: Negative.   Gastrointestinal: Negative.   Genitourinary: Negative.   Skin: Negative.   Neurological:  Positive for dizziness, weakness and light-headedness. Negative for syncope.    Physical Exam Triage Vital Signs ED Triage Vitals  Enc Vitals Group     BP 07/16/21 1151 (!) 123/94     Pulse Rate 07/16/21 1151 70     Resp 07/16/21 1151 16     Temp 07/16/21 1151 98.2 F (36.8 C)     Temp Source 07/16/21 1151 Oral     SpO2 07/16/21  1151 100 %     Weight --      Height --      Head Circumference --      Peak Flow --      Pain Score 07/16/21 1223 0     Pain Loc --      Pain Edu? --      Excl. in [place]? --    No data found.  Updated Vital Signs BP (!) 123/94 (BP Location: Left Arm)   Pulse 70   Temp 98.2 F (36.8 C) (Oral)   Resp 16   SpO2 100%   Visual Acuity Right Eye Distance:   Left Eye Distance:   Bilateral Distance:    Right Eye Near:   Left Eye Near:    Bilateral Near:     Physical Exam Constitutional:      Appearance: Normal appearance.  HENT:     Right Ear: Tympanic membrane normal.     Left Ear: Tympanic membrane normal.     Nose: Nose normal.     Mouth/Throat:     Mouth: Mucous membranes are moist.  Eyes:     Pupils: Pupils are equal, round, and reactive to light.  Cardiovascular:     Rate and Rhythm: Bradycardia present.  Pulmonary:     Effort: Pulmonary effort is normal.  Abdominal:     General: Abdomen is flat.  Musculoskeletal:     Cervical back: Normal range of motion.  Skin:    General: Skin is warm.  Neurological:     Mental Status: She is alert and oriented to person, place, and time.     Comments: Pt has the shakes parkinson shake while at rest.      UC Treatments / Results  Labs (all labs ordered are listed, but only abnormal results are displayed) Labs Reviewed - No data to display  EKG   Radiology No results found.  Procedures ED EKG  Date/Time: 07/16/2021 2:01 PM Performed by: Marney Setting, NP Authorized by: Maudie Flakes, MD   Previous ECG:    Previous ECG:  Compared to current   Similarity:  No change Interpretation:    Interpretation: non-specific   Rate:    ECG rate assessment: normal   Rhythm:    Rhythm: sinus rhythm   Ectopy:    Ectopy: PVCs   (including critical care time)  Medications Ordered in UC Medications - No data to display  Initial Impression / Assessment  and Plan / UC Course  I have reviewed the triage vital  signs and the nursing notes.  Pertinent labs & imaging results that were available during my care of the patient were reviewed by me and considered in my medical decision making (see chart for details).     Spent a large amount of time discussing with pt and son that we are not able to rule out cardiac issues or neurological such as a stroke Pt will need to talk with cardiology about medications causing orthostatic hypotension hr 60 while standing 120/80 bp,  Educated to avoid the dramamine this may cause some tiredness and a drop in blood pressure . Try taking a Claritin daily to see if this helps  EKG was the same from 8/4 with no change.  If symptoms cont pt will need to have a scan completed in the ER  Stay hydrated well  Follow up to have cardiac monitor placed on mon.  Educated to take her time with sitting to standing   Final Clinical Impressions(s) / UC Diagnoses   Final diagnoses:  Dizziness  Vertigo     Discharge Instructions      I recommend to avoid the dramamine and try a Claritin daily to help with any fluid in ears feeling  Discuss with your cardiology about orthostatic hypotension possibly and a change to the propanolol  Take your time sitting to standing which can cause a shift in blood pressure  Your ekg is similar to previous one.  Stay hydrated well       ED Prescriptions   None    PDMP not reviewed this encounter.   Marney Setting, NP 07/16/21 1400    Marney Setting, NP 07/16/21 1402

## 2021-07-16 NOTE — Progress Notes (Signed)
ICD-10-CM   1. Syncope and collapse  R55 PCV ECHOCARDIOGRAM COMPLETE      Adrian Prows, MD, [facility] 07/16/2021, 12:35 PM Office: [phone] Fax: [phone] Pager: [phone]

## 2021-07-16 NOTE — Discharge Instructions (Addendum)
I recommend to avoid the dramamine and try a Claritin daily to help with any fluid in ears feeling  Discuss with your cardiology about orthostatic hypotension possibly and a change to the propanolol  Take your time sitting to standing which can cause a shift in blood pressure  Your ekg is similar to previous one.  Stay hydrated well

## 2021-07-16 NOTE — ED Notes (Signed)
EKG results seen by provider

## 2021-07-17 ENCOUNTER — Inpatient Hospital Stay: Payer: Medicare HMO

## 2021-07-17 DIAGNOSIS — R55 Syncope and collapse: Secondary | ICD-10-CM

## 2021-07-20 ENCOUNTER — Inpatient Hospital Stay: Payer: Medicare HMO

## 2021-07-20 ENCOUNTER — Ambulatory Visit: Payer: Medicare HMO

## 2021-07-20 ENCOUNTER — Other Ambulatory Visit: Payer: Self-pay

## 2021-07-20 DIAGNOSIS — R55 Syncope and collapse: Secondary | ICD-10-CM

## 2021-07-21 DIAGNOSIS — R55 Syncope and collapse: Secondary | ICD-10-CM | POA: Diagnosis not present

## 2021-08-12 ENCOUNTER — Ambulatory Visit: Payer: Medicare HMO | Admitting: Cardiology

## 2021-08-12 ENCOUNTER — Other Ambulatory Visit: Payer: Self-pay

## 2021-08-12 ENCOUNTER — Encounter: Payer: Self-pay | Admitting: Cardiology

## 2021-08-12 VITALS — BP 145/75 | HR 63 | Temp 98.2°F | Resp 17 | Ht 70.0 in | Wt 121.4 lb

## 2021-08-12 DIAGNOSIS — R55 Syncope and collapse: Secondary | ICD-10-CM | POA: Diagnosis not present

## 2021-08-12 DIAGNOSIS — R001 Bradycardia, unspecified: Secondary | ICD-10-CM

## 2021-08-12 DIAGNOSIS — I10 Essential (primary) hypertension: Secondary | ICD-10-CM

## 2021-08-12 NOTE — Progress Notes (Signed)
Primary Physician:  Leanna Battles, MD   Patient ID: [name], female    DOB: [birth year], 80 y.o.   MRN: [id number]  Subjective:   Chief Complaint  Patient presents with   Follow-up    4 WEEKS   Results   Loss of Consciousness   HPI: SIDNI FUSCO  is a 80 y.o. female  HTN and HLD, not on therapy for either, was started on amlodipine, but developed severe dizziness, ARB caused fatigue. She also has chronic generalized tremors for which she takes propranolol very small dose on a PRN basis.     Patient presents today for evaluation of a syncopal episode that occurred on August 1 week 2022, I performed a 2-week event monitor. She has not had any recurrence of syncope.   She has never had a syncopal episode prior to this. It was midday and she was walking through her house when her vision went dark for an unknown amount of time and she fell and woke up on the floor with epistaxis. She denies any pre-syncopal symptoms. She states that she returned to baseline after the syncope and resumed her normal daily activities.  She still likes to be independent and likes to drive and take a friend for shopping or for groceries.  She has not driven since the episode of syncope as per medical advice.  The patient and her son, who accompanied her to the visit today. The patient's physical activity includes raking, and walking, mowing, walking a mile and a nearby field when she feels well. Denies chest pain, palpitations, worsening SOB, DOE, orthopnea, PND, swelling, claudication.   Past Medical History:  Diagnosis Date   Anxiety    Arthritis    Chronic headaches    Depression    GERD (gastroesophageal reflux disease)    HLD (hyperlipidemia)    Internal hemorrhoids    Osteoporosis    Skin cancer    skin cancer    Past Surgical History:  Procedure Laterality Date   ABDOMINAL HYSTERECTOMY     APPENDECTOMY     COLONOSCOPY     ESOPHAGEAL MANOMETRY  12/18/2012   Procedure:  ESOPHAGEAL MANOMETRY (EM);  Surgeon: Lafayette Dragon, MD;  Location: [facility] ENDOSCOPY;  Service: Endoscopy;  Laterality: N/A;   ESOPHAGOGASTRODUODENOSCOPY (EGD) WITH PROPOFOL N/A 06/15/2021   Procedure: ESOPHAGOGASTRODUODENOSCOPY (EGD) WITH PROPOFOL;  Surgeon: [name], MD;  Location: [facility] ENDOSCOPY;  Service: Gastroenterology;  Laterality: N/A;   EYE SURGERY     LAMINECTOMY     TONSILLECTOMY      Social History   Socioeconomic History   Marital status: Widowed    Spouse name: Not on file   Number of children: 2   Years of education: Not on file   Highest education level: Not on file  Occupational History   Occupation: retired  Tobacco Use   Smoking status: Former    Packs/day: 0.25    Years: 3.00    Pack years: 0.75    Types: Cigarettes    Quit date: 1960    Years since quitting: 62.7   Smokeless tobacco: Never   Tobacco comments:    when she was a teenager  Scientific laboratory technician Use: Never used  Substance and Sexual Activity   Alcohol use: No   Drug use: No   Sexual activity: Not on file  Other Topics Concern   Not on file  Social History Narrative   Lives alone, independent   Walks regularly, rakes  Son, Coralyn Mark lives next door   One grandchild   Attends church   Psych: denies depressed mood or anxiety   Social Determinants of Radio broadcast assistant Strain: Not on file  Food Insecurity: Not on file  Transportation Needs: Not on file  Physical Activity: Not on file  Stress: Not on file  Social Connections: Not on file  Intimate Partner Violence: Not on file   Review of Systems  Cardiovascular:  Positive for syncope. Negative for chest pain, dyspnea on exertion and leg swelling.  Musculoskeletal:  Positive for arthritis.  Gastrointestinal:  Negative for melena.     Objective:   Vitals with BMI 08/12/2021 08/12/2021 07/16/2021  Height - 5' 10"  -  Weight - 121 lbs 6 oz -  BMI - 93.73 -  Systolic 428 768 115  Diastolic 75 85 94  Pulse 63 62 70    Physical  Exam Constitutional:      Comments: Moderately built and petite in no acute distress.  Generalized tremors and head bobbing noted.   HENT:     Head: Normocephalic and atraumatic.  Cardiovascular:     Rate and Rhythm: Normal rate and regular rhythm.     Pulses: Intact distal pulses.     Heart sounds: Normal heart sounds.  Pulmonary:     Effort: Pulmonary effort is normal. No accessory muscle usage or respiratory distress.     Breath sounds: Normal breath sounds. No wheezing or rales.  Abdominal:     General: Bowel sounds are normal.     Palpations: Abdomen is soft.     Tenderness: There is no abdominal tenderness.  Musculoskeletal:        General: Deformity (in the DIP joint) present.     Cervical back: Normal range of motion.     Right lower leg: No edema.     Left lower leg: No edema.  Skin:    General: Skin is warm and dry.  Neurological:     Mental Status: She is alert.   Orthostatic VS for the past 72 hrs (Last 3 readings):  Patient Position BP Location Cuff Size  08/12/21 1436 Sitting Left Arm Normal  08/12/21 1430 Sitting Left Arm Normal    Radiology: No results found.  MR Brain WO Contrast 12/20/2020 1. No acute abnormality. 2. Remote right cerebellar infarct. 3. Moderate chronic microvascular ischemic disease.  Laboratory examination:    CMP Latest Ref Rng & Units 12/05/2020 03/29/2019 09/26/2018  Glucose 70 - 99 mg/dL 91 102(H) -  BUN 8 - 23 mg/dL 15 18 17   Creatinine 0.44 - 1.00 mg/dL 0.95 1.18(H) 0.9  Sodium 135 - 145 mmol/L 134(L) 143 143  Potassium 3.5 - 5.1 mmol/L 4.0 4.3 4.1  Chloride 98 - 111 mmol/L 101 106 -  CO2 22 - 32 mmol/L 21(L) 26 -  Calcium 8.9 - 10.3 mg/dL 8.3(L) 9.5 -  Alkaline Phos 25 - 125 - - 72  AST 13 - 35 - - 22  ALT 7 - 35 - - 16   CBC Latest Ref Rng & Units 12/05/2020 03/29/2019 09/26/2018  WBC 4.0 - 10.5 K/uL 4.6 5.8 4.0  Hemoglobin 12.0 - 15.0 g/dL 12.8 12.5 11.9(A)  Hematocrit 36.0 - 46.0 % 41.3 39.2 38  Platelets 150 - 400  K/uL 109(L) 160 -   Lipid Panel     Component Value Date/Time   CHOL 250 (A) 09/26/2018 0000   TRIG 66 09/26/2018 0000   HDL 79 (A) 09/26/2018 0000  Nichols 158 09/26/2018 0000   HEMOGLOBIN A1C No results found for: HGBA1C, MPG TSH No results for input(s): TSH in the last 8760 hours.   PRN Meds:. There are no discontinued medications.  Current Meds  Medication Sig   meloxicam (MOBIC) 15 MG tablet Take 15 mg by mouth daily.   propranolol (INDERAL) 10 MG tablet Take 10 mg by mouth daily as needed.    External labs  Labs on 10/16/2020 CMP: Sodium 140, potassium 4.5, creatinine 0.9, BUN 17, glucose 96, AST 28, ALT 16, alk phos 97 CBC: Hemoglobin 13.4, HCT 40.6, MCV 96.1, platelets 163 Lipid panel: LDL 153, HDL 93, triglycerides 54, total cholesterol 257 TSH 1.82 Free T4 1.0 Vitamin D 26.5  Labs on 12/16/2020 BMP: Sodium 137, potassium 4.6, creatinine 0.8, BUN 13, glucose 99, EGFR 69.2 CBC: Hemoglobin 13.5, HCT 40.1, MCV 92.3, platelets 369 Hemoccult negative  Cardiac Studies:   Treadmill exercise stress test 06/28/2016: Indications: Shortness of breath, Abn ECG The resting electrocardiogram demonstrated normal sinus rhythm, normal resting conduction, no resting arrhythmias and normal rest repolarization.  The stress electrocardiogram was normal.  Upsloping ST depression with exercise back to baseline at < 34mSec of QRS. There were no significant arrhythmias. Patient exercised on Bruce protocol for  5:00 minutes and achieved  93% of Max Predicted HR (Target HR was >85% MPHR) and  7.02 METS. Stress symptoms included fatigue and dyspnea. Normal BP response.   Impression: Normal stress EKG.  Low normal exercise capacity for age.  CT angiogram of the chest and abdomen 10/22/2019: Chest: 1. 7 mm solid spiculated nodule in the posterior segment left upper lobe. Could consider short-term interval follow-up given patient's history of prior malignancy and clinical presentation  versus PET-CT or tissue sampling. 2. Cardiomegaly with biatrial enlargement. Coronary atherosclerosis. 3. Central pulmonary artery enlargement compatible with pulmonary artery hypertension. 4. Mild compressive effect upon the right heart by a pectus deformity of the chest wall. 5. Aortic Atherosclerosis (ICD10-I70.0). Abdomen: 1. No acute intra-abdominal process . 2. Mild skin thickening and subcutaneous stranding superficial to the sacrum. Correlate with physical examination to exclude decubitus skin breakdown. 3. Paucity of intraperitoneal and subcutaneous fat compatible with reported history of weight loss.  Lexiscan Tetrofosmin stress test 11/05/2019: No previous exam available for comparison. Lexiscan nuclear stress test performed using 1-day protocol. Myocardial perfusion imaging is normal. Left ventricular ejection fraction is  59% with normal wall motion. Low risk study.  PCV ECHOCARDIOGRAM COMPLETE 07/20/2021 Left ventricle cavity is normal in size and wall thickness. Normal global wall motion. Normal LV systolic function with visual EF 50-55%. Normal diastolic filling pattern. Moderate (Grade II) mitral regurgitation. Mild tricuspid regurgitation. Moderate pulmonic regurgitation. No evidence of pulmonary hypertension. Compared to previous study in 2020, AI and PH now absent. TR is reduced from moderate to mild.  Zio Patch Extended out patient EKG monitoring 14 days starting 07/20/2021:  Preliminary report, normal sinus rhythm without atrial fibrillation or high degree AV block.  EKG:  EKG 07/09/2021: Sinus bradycardia with sinus arrhythmia at the rate of 54 bpm, left axis deviation, left anterior fascicular block.  Poor R wave progression, probably normal variant but cannot exclude anteroseptal infarct old.  No evidence of ischemia, normal QT interval. No significant change from 10/31/2019 and 07/23/2019. HR previously 61/min.   EKG 10/31/2019: Normal sinus rhythm at the rate of  61 bpm, left atrial abnormality, left axis deviation, left anterior fascicular block.  Incomplete right bundle branch block.  Poor R-wave progression, cannot exclude anteroseptal  infarct old.  PVCs (2).  Nonspecific T normality.  Abnormal EKG. No significant change from EKG 07/23/2019.   Assessment:     ICD-10-CM   1. Syncope and collapse  R55     2. Sinus bradycardia  R00.1     3. Primary hypertension  I10      Recommendations:   DUSTEE BOTTENFIELD  is a 80 y.o. HTN and HLD, not on therapy for either, was started on amlodipine, but developed severe dizziness, ARB caused fatigue. She also has chronic generalized tremors for which she takes propranolol very small dose on a PRN basis.     Patient presents today for evaluation of a syncopal episode that occurred on August 1 week 2022, I performed a 2-week event monitor, no significant arrhythmias or heart block.  In view of high suspicion for cardiac etiology for syncope, I recommended a loop recorder implantation.  Her son Coralyn Mark is present at the bedside, I discussed the indications for loop recorder implantation, possible complication.  They are willing to proceed with loop recorder implantation.  Otherwise I will see her back in 6 months for follow-up.  No change in mitral regurgitation by echocardiogram, normal LVEF. The patient verbalizes understanding of this plan.   Adrian Prows, MD, [facility] 08/12/2021, 10:25 PM Office: [phone] Fax: [phone] Pager: [phone]

## 2021-08-13 ENCOUNTER — Ambulatory Visit: Payer: Medicare HMO | Admitting: Cardiology

## 2021-08-13 VITALS — BP 143/72 | HR 59 | Temp 98.0°F | Resp 16 | Ht 67.0 in | Wt 121.0 lb

## 2021-08-13 DIAGNOSIS — Z95818 Presence of other cardiac implants and grafts: Secondary | ICD-10-CM

## 2021-08-13 DIAGNOSIS — R001 Bradycardia, unspecified: Secondary | ICD-10-CM | POA: Diagnosis not present

## 2021-08-13 DIAGNOSIS — I444 Left anterior fascicular block: Secondary | ICD-10-CM | POA: Diagnosis not present

## 2021-08-13 DIAGNOSIS — R55 Syncope and collapse: Secondary | ICD-10-CM

## 2021-08-13 HISTORY — PX: LOOP RECORDER IMPLANT: SHX5954

## 2021-08-13 NOTE — Patient Instructions (Signed)
Take the top dressing only and leave the steri strips in place and they will fall off themselves, otherwise can take them off in 1 week to 10 days.   Tylenol for pain

## 2021-08-14 ENCOUNTER — Encounter: Payer: Self-pay | Admitting: Cardiology

## 2021-08-14 DIAGNOSIS — R55 Syncope and collapse: Secondary | ICD-10-CM | POA: Insufficient documentation

## 2021-08-14 DIAGNOSIS — Z95818 Presence of other cardiac implants and grafts: Secondary | ICD-10-CM | POA: Insufficient documentation

## 2021-08-14 NOTE — Progress Notes (Signed)
Chief Complaint  Patient presents with   Syncope and collapse   loop implant   Today's Vitals   08/13/21 1512  BP: (!) 143/72  Pulse: (!) 59  Resp: 16  Temp: 98 F (36.7 C)  SpO2: 98%  Weight: 121 lb (54.9 kg)  Height: '5\' 7"'$  (1.702 m)   Body mass index is 18.95 kg/m. Physical Exam Constitutional:      Comments: Moderately built and petite in no acute distress.  Generalized tremors and head bobbing noted.   HENT:     Head: Normocephalic and atraumatic.  Cardiovascular:     Rate and Rhythm: Normal rate and regular rhythm.     Pulses: Intact distal pulses.     Heart sounds: Normal heart sounds.  Pulmonary:     Effort: Pulmonary effort is normal. No accessory muscle usage or respiratory distress.     Breath sounds: Normal breath sounds. No wheezing or rales.  Abdominal:     General: Bowel sounds are normal.     Palpations: Abdomen is soft.     Tenderness: There is no abdominal tenderness.   Prodedure performed: Insertion of Biotronik Loop Recorder. Serial # [id number]  Indication: Syncope  Blood pressure (!) 143/72, pulse (!) 59, temperature 98 F (36.7 C), resp. rate 16, height '5\' 7"'$  (1.702 m), weight 121 lb (54.9 kg), SpO2 98 %.   After obtaining informed consent, explaining the procedure to the patient, under sterile precautions, local anesthesia with 1% lidocaine with epinephrine was injected subcutaneously in the left parasternal region.  20 mL utilized.  A small nick was made in the left paraspinal region at the 3rd intercostal space. Biotronik loop recorder was inserted without any complications.  Patient tolerated the procedure well.  The incision was closed with Steri-Strips.  There is no blood loss, no hematoma.  Written instrtuctions regarding wound care given to the patient.  Device setting:  R wave amplitude 0.9-1.2 mV.   Programmed:  AF detection to Low. HVR 160/min. Bradycardia 40 BPM Sudden rate drop 50 bpm Asystole 3 Sec Patient triggered:  On Patient Trigger: ON    ICD-10-CM   1. Loop recorder: Biotronik 08/13/2021 for synocpe  Z95.818     2. Syncope and collapse  R55     3. LAFB (left anterior fascicular block)  I44.4         Adrian Prows, MD, [facility] 08/14/2021, 6:55 AM Office: [phone] Fax: [phone] Pager: [phone]

## 2021-08-27 ENCOUNTER — Telehealth: Payer: Self-pay | Admitting: Cardiology

## 2021-08-27 ENCOUNTER — Encounter: Payer: Self-pay | Admitting: Cardiology

## 2021-08-27 NOTE — Telephone Encounter (Signed)
Called Suzanne Leon about loop transmission alert, patient states that she was awake approximately around that time but she had not felt anything different, she has not had any syncope since loop implantation, she just feels tired today but no other specific complaints.  Advised her that we will continue to monitor her closely for any asystole episodes.  As the episode occurred in the middle of the night, may not be clinically significant but will trend this going forward.  Unscheduled (Alert)  5.5 seconds of ventricular standstill on 08/26/2021 at 02:13 AM

## 2021-08-27 NOTE — Telephone Encounter (Signed)
From pt

## 2021-09-12 DIAGNOSIS — Z95818 Presence of other cardiac implants and grafts: Secondary | ICD-10-CM | POA: Diagnosis not present

## 2021-09-12 DIAGNOSIS — Z4509 Encounter for adjustment and management of other cardiac device: Secondary | ICD-10-CM | POA: Diagnosis not present

## 2021-09-12 DIAGNOSIS — R55 Syncope and collapse: Secondary | ICD-10-CM | POA: Diagnosis not present

## 2021-10-13 DIAGNOSIS — R55 Syncope and collapse: Secondary | ICD-10-CM | POA: Diagnosis not present

## 2021-10-13 DIAGNOSIS — Z95818 Presence of other cardiac implants and grafts: Secondary | ICD-10-CM | POA: Diagnosis not present

## 2021-10-13 DIAGNOSIS — Z4509 Encounter for adjustment and management of other cardiac device: Secondary | ICD-10-CM | POA: Diagnosis not present

## 2021-10-26 ENCOUNTER — Ambulatory Visit: Payer: Medicare HMO | Admitting: Gastroenterology

## 2021-11-13 DIAGNOSIS — R55 Syncope and collapse: Secondary | ICD-10-CM | POA: Diagnosis not present

## 2021-11-13 DIAGNOSIS — Z4509 Encounter for adjustment and management of other cardiac device: Secondary | ICD-10-CM | POA: Diagnosis not present

## 2021-11-13 DIAGNOSIS — Z95818 Presence of other cardiac implants and grafts: Secondary | ICD-10-CM | POA: Diagnosis not present

## 2021-12-01 DIAGNOSIS — I10 Essential (primary) hypertension: Secondary | ICD-10-CM | POA: Diagnosis not present

## 2021-12-01 DIAGNOSIS — E785 Hyperlipidemia, unspecified: Secondary | ICD-10-CM | POA: Diagnosis not present

## 2021-12-01 DIAGNOSIS — E039 Hypothyroidism, unspecified: Secondary | ICD-10-CM | POA: Diagnosis not present

## 2021-12-01 DIAGNOSIS — M810 Age-related osteoporosis without current pathological fracture: Secondary | ICD-10-CM | POA: Diagnosis not present

## 2021-12-03 DIAGNOSIS — I10 Essential (primary) hypertension: Secondary | ICD-10-CM | POA: Diagnosis not present

## 2021-12-03 DIAGNOSIS — D485 Neoplasm of uncertain behavior of skin: Secondary | ICD-10-CM | POA: Diagnosis not present

## 2021-12-03 DIAGNOSIS — R229 Localized swelling, mass and lump, unspecified: Secondary | ICD-10-CM | POA: Diagnosis not present

## 2021-12-03 DIAGNOSIS — E785 Hyperlipidemia, unspecified: Secondary | ICD-10-CM | POA: Diagnosis not present

## 2021-12-08 DIAGNOSIS — Z1331 Encounter for screening for depression: Secondary | ICD-10-CM | POA: Diagnosis not present

## 2021-12-08 DIAGNOSIS — R419 Unspecified symptoms and signs involving cognitive functions and awareness: Secondary | ICD-10-CM | POA: Diagnosis not present

## 2021-12-08 DIAGNOSIS — Z1339 Encounter for screening examination for other mental health and behavioral disorders: Secondary | ICD-10-CM | POA: Diagnosis not present

## 2021-12-08 DIAGNOSIS — Z0000 Encounter for general adult medical examination without abnormal findings: Secondary | ICD-10-CM | POA: Diagnosis not present

## 2021-12-08 DIAGNOSIS — Z113 Encounter for screening for infections with a predominantly sexual mode of transmission: Secondary | ICD-10-CM | POA: Diagnosis not present

## 2021-12-08 DIAGNOSIS — R072 Precordial pain: Secondary | ICD-10-CM | POA: Diagnosis not present

## 2021-12-14 DIAGNOSIS — Z4509 Encounter for adjustment and management of other cardiac device: Secondary | ICD-10-CM | POA: Diagnosis not present

## 2021-12-14 DIAGNOSIS — R55 Syncope and collapse: Secondary | ICD-10-CM | POA: Diagnosis not present

## 2021-12-14 DIAGNOSIS — Z95818 Presence of other cardiac implants and grafts: Secondary | ICD-10-CM | POA: Diagnosis not present

## 2022-01-14 DIAGNOSIS — Z4509 Encounter for adjustment and management of other cardiac device: Secondary | ICD-10-CM | POA: Diagnosis not present

## 2022-01-14 DIAGNOSIS — R55 Syncope and collapse: Secondary | ICD-10-CM | POA: Diagnosis not present

## 2022-01-14 DIAGNOSIS — Z95818 Presence of other cardiac implants and grafts: Secondary | ICD-10-CM | POA: Diagnosis not present

## 2022-02-09 ENCOUNTER — Encounter: Payer: Self-pay | Admitting: Cardiology

## 2022-02-09 ENCOUNTER — Ambulatory Visit: Payer: Medicare HMO | Admitting: Cardiology

## 2022-02-09 ENCOUNTER — Other Ambulatory Visit: Payer: Self-pay

## 2022-02-09 VITALS — BP 159/76 | HR 64 | Temp 98.6°F | Resp 16 | Ht 67.0 in | Wt 121.2 lb

## 2022-02-09 DIAGNOSIS — R55 Syncope and collapse: Secondary | ICD-10-CM | POA: Diagnosis not present

## 2022-02-09 DIAGNOSIS — R001 Bradycardia, unspecified: Secondary | ICD-10-CM | POA: Diagnosis not present

## 2022-02-09 DIAGNOSIS — Z95818 Presence of other cardiac implants and grafts: Secondary | ICD-10-CM | POA: Diagnosis not present

## 2022-02-09 DIAGNOSIS — I10 Essential (primary) hypertension: Secondary | ICD-10-CM | POA: Diagnosis not present

## 2022-02-09 NOTE — Progress Notes (Signed)
Primary Physician:  Donnajean Lopes, MD   Patient ID: Suzanne Leon, female    DOB: [birth year], 81 y.o.   MRN: [id number]  Subjective:   Chief Complaint  Patient presents with   Syncope and collapse   Hypertension   Follow-up   HPI: Suzanne Leon  is a 81 y.o. female  HTN and HLD, not on therapy for either, was started on amlodipine, but developed severe dizziness, ARB caused fatigue. She also has chronic generalized tremors for which she takes propranolol very small dose on a PRN basis.     Patient presents today for evaluation of a syncopal episode that occurred on August 1 week 2022, fortunately she has not had any recurrence of syncope,  Past Medical History:  Diagnosis Date   Alopecia totalis 10/27/2016   Anxiety    Arthritis    Chronic headaches    Depression    Educated about COVID-19 virus infection 04/02/2019   GERD (gastroesophageal reflux disease)    HLD (hyperlipidemia)    Internal hemorrhoids    Osteoporosis    Skin cancer    skin cancer   Weight loss 10/19/2018   Social History   Tobacco Use   Smoking status: Former    Packs/day: 0.25    Years: 3.00    Pack years: 0.75    Types: Cigarettes    Quit date: 1960    Years since quitting: 63.2   Smokeless tobacco: Never   Tobacco comments:    when she was a teenager  Substance Use Topics   Alcohol use: No  Marital Status: Widowed    Review of Systems  Cardiovascular:  Negative for chest pain, dyspnea on exertion, leg swelling and syncope.  Gastrointestinal:  Negative for melena.     Objective:   Vitals with BMI 02/09/2022 08/13/2021 08/12/2021  Height 5' 7"  5' 7"  -  Weight 121 lbs 3 oz 121 lbs -  BMI 49.44 96.75 -  Systolic 916 384 665  Diastolic 76 72 75  Pulse 64 59 63    Physical Exam Constitutional:      Comments: Moderately built and petite in no acute distress.  Generalized tremors and head bobbing noted.   Cardiovascular:     Rate and Rhythm: Normal rate and regular rhythm.      Pulses: Intact distal pulses.     Heart sounds: Normal heart sounds.  Pulmonary:     Effort: Pulmonary effort is normal. No accessory muscle usage or respiratory distress.     Breath sounds: Normal breath sounds. No wheezing or rales.  Abdominal:     General: Bowel sounds are normal.     Palpations: Abdomen is soft.     Tenderness: There is no abdominal tenderness.  Musculoskeletal:     Right lower leg: No edema.     Left lower leg: No edema.  Orthostatic VS for the past 72 hrs (Last 3 readings):  Orthostatic BP Patient Position BP Location Cuff Size Orthostatic Pulse  02/09/22 1504 152/84 Standing Left Arm Normal 66  02/09/22 1503 148/74 Sitting Left Arm Normal 69  02/09/22 1502 164/69 Supine Left Arm Normal 55    Radiology: No results found.  MR Brain WO Contrast 12/20/2020 1. No acute abnormality. 2. Remote right cerebellar infarct. 3. Moderate chronic microvascular ischemic disease.  Laboratory examination:    CMP Latest Ref Rng & Units 12/05/2020 03/29/2019 09/26/2018  Glucose 70 - 99 mg/dL 91 102(H) -  BUN 8 - 23 mg/dL 15 18  17  Creatinine 0.44 - 1.00 mg/dL 0.95 1.18(H) 0.9  Sodium 135 - 145 mmol/L 134(L) 143 143  Potassium 3.5 - 5.1 mmol/L 4.0 4.3 4.1  Chloride 98 - 111 mmol/L 101 106 -  CO2 22 - 32 mmol/L 21(L) 26 -  Calcium 8.9 - 10.3 mg/dL 8.3(L) 9.5 -  Alkaline Phos 25 - 125 - - 72  AST 13 - 35 - - 22  ALT 7 - 35 - - 16   CBC Latest Ref Rng & Units 12/05/2020 03/29/2019 09/26/2018  WBC 4.0 - 10.5 K/uL 4.6 5.8 4.0  Hemoglobin 12.0 - 15.0 g/dL 12.8 12.5 11.9(A)  Hematocrit 36.0 - 46.0 % 41.3 39.2 38  Platelets 150 - 400 K/uL 109(L) 160 -   Lipid Panel     Component Value Date/Time   CHOL 250 (A) 09/26/2018 0000   TRIG 66 09/26/2018 0000   HDL 79 (A) 09/26/2018 0000   LDLCALC 158 09/26/2018 0000   HEMOGLOBIN A1C No results found for: HGBA1C, MPG TSH No results for input(s): TSH in the last 8760 hours.   PRN Meds:. There are no discontinued  medications.  Current Meds  Medication Sig   amLODipine (NORVASC) 2.5 MG tablet Take 2.5 mg by mouth every evening.   meloxicam (MOBIC) 15 MG tablet Take 15 mg by mouth daily.   propranolol (INDERAL) 10 MG tablet Take 10 mg by mouth daily as needed.    External labs  Labs on 10/16/2020 CMP: Sodium 140, potassium 4.5, creatinine 0.9, BUN 17, glucose 96, AST 28, ALT 16, alk phos 97 CBC: Hemoglobin 13.4, HCT 40.6, MCV 96.1, platelets 163 Lipid panel: LDL 153, HDL 93, triglycerides 54, total cholesterol 257 TSH 1.82 Free T4 1.0 Vitamin D 26.5  Labs on 12/16/2020 BMP: Sodium 137, potassium 4.6, creatinine 0.8, BUN 13, glucose 99, EGFR 69.2 CBC: Hemoglobin 13.5, HCT 40.1, MCV 92.3, platelets 369 Hemoccult negative  Cardiac Studies:   CT angiogram of the chest and abdomen 10/22/2019: Chest: 1. 7 mm solid spiculated nodule in the posterior segment left upper lobe. Could consider short-term interval follow-up given patient's history of prior malignancy and clinical presentation versus PET-CT or tissue sampling. 2. Cardiomegaly with biatrial enlargement. Coronary atherosclerosis. 3. Central pulmonary artery enlargement compatible with pulmonary artery hypertension. 4. Mild compressive effect upon the right heart by a pectus deformity of the chest wall. 5. Aortic Atherosclerosis (ICD10-I70.0). Abdomen: 1. No acute intra-abdominal process . 2. Mild skin thickening and subcutaneous stranding superficial to the sacrum. Correlate with physical examination to exclude decubitus skin breakdown. 3. Paucity of intraperitoneal and subcutaneous fat compatible with reported history of weight loss.  Lexiscan Tetrofosmin stress test 11/05/2019: No previous exam available for comparison. Lexiscan nuclear stress test performed using 1-day protocol. Myocardial perfusion imaging is normal. Left ventricular ejection fraction is  59% with normal wall motion. Low risk study.  PCV ECHOCARDIOGRAM COMPLETE  07/20/2021 Left ventricle cavity is normal in size and wall thickness. Normal global wall motion. Normal LV systolic function with visual EF 50-55%. Normal diastolic filling pattern. Moderate (Grade II) mitral regurgitation. Mild tricuspid regurgitation. Moderate pulmonic regurgitation. No evidence of pulmonary hypertension. Compared to previous study in 2020, AI and PH now absent. TR is reduced from moderate to mild.  Biotronik Biomonitor loop recorder in situ 08/13/2021   Remote loop recorder transmission 01/14/2022: Predominant rhythm is normal sinus rhythm there were no symptomatic events, no heart block.  No symptoms reported.  1 asymptomatic 5.5-second ventricular standstill on 08/26/2021 at 2:13 AM.  EKG:  EKG 02/09/2022: Normal sinus rhythm with rate of 59 bpm, left axis deviation, left anterior fascicular block.  Incomplete right bundle branch block.  No significant change from 07/09/2021.   Assessment:     ICD-10-CM   1. Syncope and collapse  R55 EKG 12-Lead    2. Primary hypertension  I10     3. Sinus bradycardia  R00.1     4. Loop recorder: Biotronik 08/13/2021 for synocpe  Z95.818     5. Elevated blood pressure reading with diagnosis of hypertension  I10      Recommendations:   MAYLA BIDDY  is a 81 y.o. HTN and HLD, not on therapy for either, was started on amlodipine, but developed severe dizziness, ARB caused fatigue. She also has chronic generalized tremors for which she takes propranolol very small dose on a PRN basis.     Patient presents today for evaluation of a syncopal episode that occurred on August 1 week 2022, fortunately she has not had any recurrence of syncope, advised her that she can resume driving.  I reviewed the remote transmissions, she has not had any significant pauses or heart block.  Blood pressure is elevated today, patient's blood pressure at home has been stable and normal and is advised her to continue to monitor this and if blood  pressure is >140/80 mmHg, to increase amlodipine to 5 mg in the evening.    Her son Coralyn Mark is present at the bedside, all questions answered, I will see her back in a year.  She will continue remote monitoring of her loop recorder.  No change in the EKG.   Adrian Prows, MD, [facility] 02/09/2022, 3:30 PM Office: [phone] Fax: [phone] Pager: [phone]

## 2022-02-14 DIAGNOSIS — Z95818 Presence of other cardiac implants and grafts: Secondary | ICD-10-CM | POA: Diagnosis not present

## 2022-02-14 DIAGNOSIS — R55 Syncope and collapse: Secondary | ICD-10-CM | POA: Diagnosis not present

## 2022-02-14 DIAGNOSIS — Z4509 Encounter for adjustment and management of other cardiac device: Secondary | ICD-10-CM | POA: Diagnosis not present

## 2022-03-17 DIAGNOSIS — R55 Syncope and collapse: Secondary | ICD-10-CM | POA: Diagnosis not present

## 2022-03-17 DIAGNOSIS — Z95818 Presence of other cardiac implants and grafts: Secondary | ICD-10-CM | POA: Diagnosis not present

## 2022-03-17 DIAGNOSIS — Z4509 Encounter for adjustment and management of other cardiac device: Secondary | ICD-10-CM | POA: Diagnosis not present

## 2022-04-08 DIAGNOSIS — R42 Dizziness and giddiness: Secondary | ICD-10-CM | POA: Diagnosis not present

## 2022-04-08 DIAGNOSIS — R1310 Dysphagia, unspecified: Secondary | ICD-10-CM | POA: Diagnosis not present

## 2022-04-08 DIAGNOSIS — E440 Moderate protein-calorie malnutrition: Secondary | ICD-10-CM | POA: Diagnosis not present

## 2022-04-08 DIAGNOSIS — G4700 Insomnia, unspecified: Secondary | ICD-10-CM | POA: Diagnosis not present

## 2022-04-08 DIAGNOSIS — R6889 Other general symptoms and signs: Secondary | ICD-10-CM | POA: Diagnosis not present

## 2022-04-08 DIAGNOSIS — E785 Hyperlipidemia, unspecified: Secondary | ICD-10-CM | POA: Diagnosis not present

## 2022-04-08 DIAGNOSIS — R7989 Other specified abnormal findings of blood chemistry: Secondary | ICD-10-CM | POA: Diagnosis not present

## 2022-04-08 DIAGNOSIS — F419 Anxiety disorder, unspecified: Secondary | ICD-10-CM | POA: Diagnosis not present

## 2022-04-08 DIAGNOSIS — I10 Essential (primary) hypertension: Secondary | ICD-10-CM | POA: Diagnosis not present

## 2022-04-17 DIAGNOSIS — Z95818 Presence of other cardiac implants and grafts: Secondary | ICD-10-CM | POA: Diagnosis not present

## 2022-04-17 DIAGNOSIS — R55 Syncope and collapse: Secondary | ICD-10-CM | POA: Diagnosis not present

## 2022-04-17 DIAGNOSIS — Z4509 Encounter for adjustment and management of other cardiac device: Secondary | ICD-10-CM | POA: Diagnosis not present

## 2022-05-18 DIAGNOSIS — Z95818 Presence of other cardiac implants and grafts: Secondary | ICD-10-CM | POA: Diagnosis not present

## 2022-05-18 DIAGNOSIS — R55 Syncope and collapse: Secondary | ICD-10-CM | POA: Diagnosis not present

## 2022-05-18 DIAGNOSIS — Z4509 Encounter for adjustment and management of other cardiac device: Secondary | ICD-10-CM | POA: Diagnosis not present

## 2022-06-07 DIAGNOSIS — H61001 Unspecified perichondritis of right external ear: Secondary | ICD-10-CM | POA: Diagnosis not present

## 2022-06-18 DIAGNOSIS — R55 Syncope and collapse: Secondary | ICD-10-CM | POA: Diagnosis not present

## 2022-06-18 DIAGNOSIS — Z95818 Presence of other cardiac implants and grafts: Secondary | ICD-10-CM | POA: Diagnosis not present

## 2022-06-18 DIAGNOSIS — Z4509 Encounter for adjustment and management of other cardiac device: Secondary | ICD-10-CM | POA: Diagnosis not present

## 2022-07-19 DIAGNOSIS — Z95818 Presence of other cardiac implants and grafts: Secondary | ICD-10-CM | POA: Diagnosis not present

## 2022-07-19 DIAGNOSIS — Z4509 Encounter for adjustment and management of other cardiac device: Secondary | ICD-10-CM | POA: Diagnosis not present

## 2022-07-19 DIAGNOSIS — R55 Syncope and collapse: Secondary | ICD-10-CM | POA: Diagnosis not present

## 2022-08-19 DIAGNOSIS — Z4509 Encounter for adjustment and management of other cardiac device: Secondary | ICD-10-CM | POA: Diagnosis not present

## 2022-08-19 DIAGNOSIS — Z95818 Presence of other cardiac implants and grafts: Secondary | ICD-10-CM | POA: Diagnosis not present

## 2022-08-19 DIAGNOSIS — R55 Syncope and collapse: Secondary | ICD-10-CM | POA: Diagnosis not present

## 2022-09-19 DIAGNOSIS — R55 Syncope and collapse: Secondary | ICD-10-CM | POA: Diagnosis not present

## 2022-09-19 DIAGNOSIS — Z4509 Encounter for adjustment and management of other cardiac device: Secondary | ICD-10-CM | POA: Diagnosis not present

## 2022-09-19 DIAGNOSIS — Z95818 Presence of other cardiac implants and grafts: Secondary | ICD-10-CM | POA: Diagnosis not present

## 2022-10-12 ENCOUNTER — Encounter: Payer: Self-pay | Admitting: Cardiology

## 2022-10-12 ENCOUNTER — Ambulatory Visit: Payer: Medicare HMO | Admitting: Cardiology

## 2022-10-12 VITALS — BP 135/79 | HR 67 | Temp 98.0°F | Resp 16 | Ht 67.0 in | Wt 118.0 lb

## 2022-10-12 DIAGNOSIS — R55 Syncope and collapse: Secondary | ICD-10-CM

## 2022-10-12 DIAGNOSIS — Z4509 Encounter for adjustment and management of other cardiac device: Secondary | ICD-10-CM | POA: Diagnosis not present

## 2022-10-12 DIAGNOSIS — Z95818 Presence of other cardiac implants and grafts: Secondary | ICD-10-CM

## 2022-10-12 NOTE — Patient Instructions (Addendum)
There is a very small superficial wound at the site of loop explant.  Keep the top dressing for 2 days and he can take  .  She can shower after 3 days.  There will be 3 small Steri-Strips underneath the top dressing, leave them on until follow-up by themselves.  We will see her back in 1 week for check.

## 2022-10-12 NOTE — Progress Notes (Signed)
JASMEEN FRITSCH  is a 81 y.o. female  HTN and HLD, recurrent syncope, had undergone Biotronik loop implantation 09/02/2021.  She has not had any recurrence of syncope.  She now presents for loop explantation.  Risk benefits discussed with the patient and her son, patient wants the loop of although she has not had any complications, states that it is very uncomfortable.  Technique: Under sterile precautions, using lidocaine for local anesthesia, a small nick was made at the top of the loop device entry, with the help of forceps and hemostats, I was able to easily withdraw the device without any complications.  The wound was glued and Steri-Strips applied.  Patient tolerated the procedure well.  No immediate complications.    ICD-10-CM   1. Loop recorder: Biotronik 08/13/2021 for synocpe  Z95.818     2. Syncope and collapse  R55       Adrian Prows, MD, [facility] 10/12/2022, 12:10 PM Office: [phone] Fax: [phone] Pager: [phone]

## 2022-10-20 ENCOUNTER — Ambulatory Visit: Payer: Medicare HMO

## 2022-10-20 VITALS — BP 140/81 | HR 60 | Resp 16 | Ht 67.0 in | Wt 118.0 lb

## 2022-10-20 DIAGNOSIS — Z95818 Presence of other cardiac implants and grafts: Secondary | ICD-10-CM

## 2022-10-20 DIAGNOSIS — Z4509 Encounter for adjustment and management of other cardiac device: Secondary | ICD-10-CM | POA: Diagnosis not present

## 2022-10-20 DIAGNOSIS — R55 Syncope and collapse: Secondary | ICD-10-CM | POA: Diagnosis not present

## 2022-10-20 NOTE — Progress Notes (Signed)
Wound check appointment post loop recorder explantation.  Wound without redness or edema. Incision edges approximated, wound closed with dermabond. Patient educated about wound care.

## 2022-10-21 ENCOUNTER — Encounter: Payer: Self-pay | Admitting: Cardiology

## 2022-12-03 DIAGNOSIS — R197 Diarrhea, unspecified: Secondary | ICD-10-CM | POA: Diagnosis not present

## 2022-12-03 DIAGNOSIS — R6889 Other general symptoms and signs: Secondary | ICD-10-CM | POA: Diagnosis not present

## 2022-12-03 DIAGNOSIS — I10 Essential (primary) hypertension: Secondary | ICD-10-CM | POA: Diagnosis not present

## 2023-01-17 DIAGNOSIS — I10 Essential (primary) hypertension: Secondary | ICD-10-CM | POA: Diagnosis not present

## 2023-01-17 DIAGNOSIS — M810 Age-related osteoporosis without current pathological fracture: Secondary | ICD-10-CM | POA: Diagnosis not present

## 2023-01-17 DIAGNOSIS — E785 Hyperlipidemia, unspecified: Secondary | ICD-10-CM | POA: Diagnosis not present

## 2023-01-17 DIAGNOSIS — E039 Hypothyroidism, unspecified: Secondary | ICD-10-CM | POA: Diagnosis not present

## 2023-01-17 DIAGNOSIS — K219 Gastro-esophageal reflux disease without esophagitis: Secondary | ICD-10-CM | POA: Diagnosis not present

## 2023-01-17 DIAGNOSIS — R7989 Other specified abnormal findings of blood chemistry: Secondary | ICD-10-CM | POA: Diagnosis not present

## 2023-01-24 DIAGNOSIS — E559 Vitamin D deficiency, unspecified: Secondary | ICD-10-CM | POA: Diagnosis not present

## 2023-01-24 DIAGNOSIS — Z0000 Encounter for general adult medical examination without abnormal findings: Secondary | ICD-10-CM | POA: Diagnosis not present

## 2023-01-24 DIAGNOSIS — I10 Essential (primary) hypertension: Secondary | ICD-10-CM | POA: Diagnosis not present

## 2023-01-24 DIAGNOSIS — Z1339 Encounter for screening examination for other mental health and behavioral disorders: Secondary | ICD-10-CM | POA: Diagnosis not present

## 2023-01-24 DIAGNOSIS — R911 Solitary pulmonary nodule: Secondary | ICD-10-CM | POA: Diagnosis not present

## 2023-01-24 DIAGNOSIS — G4700 Insomnia, unspecified: Secondary | ICD-10-CM | POA: Diagnosis not present

## 2023-01-24 DIAGNOSIS — R1312 Dysphagia, oropharyngeal phase: Secondary | ICD-10-CM | POA: Diagnosis not present

## 2023-01-24 DIAGNOSIS — Z1331 Encounter for screening for depression: Secondary | ICD-10-CM | POA: Diagnosis not present

## 2023-01-24 DIAGNOSIS — I2510 Atherosclerotic heart disease of native coronary artery without angina pectoris: Secondary | ICD-10-CM | POA: Diagnosis not present

## 2023-01-24 DIAGNOSIS — R82998 Other abnormal findings in urine: Secondary | ICD-10-CM | POA: Diagnosis not present

## 2023-01-24 DIAGNOSIS — R6889 Other general symptoms and signs: Secondary | ICD-10-CM | POA: Diagnosis not present

## 2023-01-24 DIAGNOSIS — J300 Vasomotor rhinitis: Secondary | ICD-10-CM | POA: Diagnosis not present

## 2023-01-24 DIAGNOSIS — I272 Pulmonary hypertension, unspecified: Secondary | ICD-10-CM | POA: Diagnosis not present

## 2023-01-25 ENCOUNTER — Other Ambulatory Visit: Payer: Self-pay | Admitting: Internal Medicine

## 2023-01-25 DIAGNOSIS — R911 Solitary pulmonary nodule: Secondary | ICD-10-CM

## 2023-01-25 DIAGNOSIS — R1312 Dysphagia, oropharyngeal phase: Secondary | ICD-10-CM

## 2023-01-27 ENCOUNTER — Ambulatory Visit
Admission: RE | Admit: 2023-01-27 | Discharge: 2023-01-27 | Disposition: A | Discharge status: Home / Self Care | Payer: Medicare HMO | Source: Ambulatory Visit | Attending: Internal Medicine | Admitting: Internal Medicine

## 2023-01-27 DIAGNOSIS — I700 Atherosclerosis of aorta: Secondary | ICD-10-CM | POA: Diagnosis not present

## 2023-01-27 DIAGNOSIS — R911 Solitary pulmonary nodule: Secondary | ICD-10-CM | POA: Diagnosis not present

## 2023-01-31 ENCOUNTER — Other Ambulatory Visit: Payer: Medicare HMO

## 2023-02-09 ENCOUNTER — Ambulatory Visit: Payer: Medicare HMO | Admitting: Cardiology

## 2023-02-14 ENCOUNTER — Ambulatory Visit
Admission: RE | Admit: 2023-02-14 | Discharge: 2023-02-14 | Disposition: A | Discharge status: Home / Self Care | Payer: Medicare HMO | Source: Ambulatory Visit | Attending: Internal Medicine | Admitting: Internal Medicine

## 2023-02-14 DIAGNOSIS — K219 Gastro-esophageal reflux disease without esophagitis: Secondary | ICD-10-CM | POA: Diagnosis not present

## 2023-02-14 DIAGNOSIS — R1310 Dysphagia, unspecified: Secondary | ICD-10-CM | POA: Diagnosis not present

## 2023-02-14 DIAGNOSIS — K224 Dyskinesia of esophagus: Secondary | ICD-10-CM | POA: Diagnosis not present

## 2023-02-14 DIAGNOSIS — R1312 Dysphagia, oropharyngeal phase: Secondary | ICD-10-CM

## 2023-02-22 ENCOUNTER — Encounter: Payer: Self-pay | Admitting: *Deleted

## 2023-02-23 ENCOUNTER — Institutional Professional Consult (permissible substitution): Payer: Medicare HMO | Admitting: Neurology

## 2023-03-17 ENCOUNTER — Encounter: Payer: Self-pay | Admitting: *Deleted

## 2023-03-21 ENCOUNTER — Encounter: Payer: Self-pay | Admitting: Neurology

## 2023-03-21 ENCOUNTER — Ambulatory Visit: Payer: Medicare HMO | Admitting: Neurology

## 2023-03-21 VITALS — BP 145/76 | HR 56 | Ht 68.0 in | Wt 113.0 lb

## 2023-03-21 DIAGNOSIS — G479 Sleep disorder, unspecified: Secondary | ICD-10-CM | POA: Diagnosis not present

## 2023-03-21 NOTE — Patient Instructions (Addendum)
Thank you for choosing [facility] for your sleep related care! It was nice to meet you today!   Here is what we discussed today:    Based on your symptoms and your exam I believe you may be at some risk for obstructive sleep apnea (aka OSA). We should proceed with a sleep study to determine whether you do or do not have OSA and how severe it is. Even, if you have mild OSA, I may want you to consider treatment with CPAP, as treatment of even borderline or mild sleep apnea can result and improvement of symptoms such as sleep disruption, daytime sleepiness, nighttime bathroom breaks, restless leg symptoms, improvement of headache syndromes, even improved mood disorder.   As explained, an attended sleep study (meaning you get to stay overnight in the sleep lab), lets us monitor sleep-related behaviors such as sleep talking and leg movements in sleep, in addition to monitoring for sleep apnea.  A home sleep test is a screening tool for sleep apnea diagnosis only, but unfortunately, does not help with any other sleep-related diagnoses.  Please remember, the long-term risks and ramifications of untreated moderate to severe obstructive sleep apnea may include (but are not limited to): increased risk for cardiovascular disease, including congestive heart failure, stroke, difficult to control hypertension, treatment resistant obesity, arrhythmias, especially irregular heartbeat commonly known as A. Fib. (atrial fibrillation); even type 2 diabetes has been linked to untreated OSA.   Other correlations that untreated obstructive sleep apnea include macular edema which is swelling of the retina in the eyes, droopy eyelid syndrome, and elevated hemoglobin and hematocrit levels (often referred to as polycythemia).  Sleep apnea can cause disruption of sleep and sleep deprivation in most cases, which, in turn, can cause recurrent headaches, problems with memory, mood, concentration, focus, and  vigilance. Most people with untreated sleep apnea report excessive daytime sleepiness, which can affect their ability to drive. Please do not drive or use heavy equipment or machinery, if you feel sleepy! Patients with sleep apnea can also develop difficulty initiating and maintaining sleep (aka insomnia).   Having sleep apnea may increase your risk for other sleep disorders, including involuntary behaviors sleep such as sleep terrors, sleep talking, sleepwalking.    Having sleep apnea can also increase your risk for restless leg syndrome and leg movements at night.   Please note that untreated obstructive sleep apnea may carry additional perioperative morbidity. Patients with significant obstructive sleep apnea (typically, in the moderate to severe degree) should receive, if possible, perioperative PAP (positive airway pressure) therapy and the surgeons and particularly the anesthesiologists should be informed of the diagnosis and the severity of the sleep disordered breathing.   We will call you or email you through MyChart with regards to your test results and plan a follow-up in sleep clinic accordingly. Most likely, you will hear from one of our nurses.   Our sleep lab administrative assistant will call you to schedule your sleep study and give you further instructions, regarding the check in process for the sleep study, arrival time, what to bring, when you can expect to leave after the study, etc., and to answer any other logistical questions you may have. If you don't hear back from her by about 2 weeks from now, please feel free to call her direct line at [phone] or you can call our general clinic number, or email us through My Chart.   

## 2023-03-21 NOTE — Progress Notes (Signed)
Subjective:    Patient ID: Suzanne Leon is a 82 y.o. female.  HPI    Suzanne Foley, Suzanne Leon, Suzanne Leon [facility] [address] [address] [place], [place] [address]  Dear Dr. Eloise Leon,  I saw your patient, Suzanne Leon, upon your kind request in my sleep clinic today for initial consultation of her sleep disorder, in particular, concern for underlying obstructive sleep apnea.  The patient is accompanied by her son today.  As you know, Suzanne Leon is an 82 year old female with an underlying medical history of alopecia, tremor, chronic headaches, arthritis, reflux disease, hyperlipidemia, osteoporosis, osteopenia, low back pain, skin cancer, anxiety, and depression, who reports chronic difficulty sleeping. She has difficulty initiating and maintaining sleep but is not forthcoming with information and son is not able to elaborate very much as he does not live with her.  He has noted audible and noisy breathing at times, she is a mouth breather.  She may snore some. Her Epworth sleepiness score is 4 out of 24, fatigue severity score is 31 out of 63.  She has nocturia about once per average night, denies recurrent nocturnal or morning headaches.  She does not take anything to help her sleep.  I reviewed your office note from 01/24/2023.  She has previously seen Suzanne Leon at [facility] neurology for weakness, essential tremor, dysphagia, and hyperreflexia.  Bedtime is generally between 9 and 10 PM and rise time around 6:30 AM.  They are not aware of any family history of sleep apnea.  She reports issues with her right ear, fullness and hearing loss at times, she has been seen by ENT.  She feels that her ear fullness on the right side improves when she lays down at night for sleep.  She does not drink caffeine daily, she is a non-smoker, she does not drink any alcohol.  She is widowed and lives alone.  Her son lives next door and checks on her at least twice daily.  She had a  tonsillectomy as a child.  She is currently not interested in pursuing a sleep study.  Her Past Medical History Is Significant For: Past Medical History:  Diagnosis Date   Alopecia totalis 10/27/2016   Anxiety    Arthritis    Chronic headaches    Depression    Dizziness    Educated about COVID-19 virus infection 04/02/2019   Essential tremor    HEAD. takes propranolol as needed.   Fatigue    GERD (gastroesophageal reflux disease)    HLD (hyperlipidemia)    Internal hemorrhoids    Low back pain    Osteopenia    Osteoporosis    Skin cancer    skin cancer   Weight loss 10/19/2018    Her Past Surgical History Is Significant For: Past Surgical History:  Procedure Laterality Date   ABDOMINAL HYSTERECTOMY     APPENDECTOMY     COLONOSCOPY     ESOPHAGEAL MANOMETRY  12/18/2012   Procedure: ESOPHAGEAL MANOMETRY (EM);  Surgeon: Hart Carwin, Suzanne Leon;  Location: [facility];  Service: Endoscopy;  Laterality: N/A;   ESOPHAGOGASTRODUODENOSCOPY (EGD) WITH PROPOFOL N/A 06/15/2021   Procedure: ESOPHAGOGASTRODUODENOSCOPY (EGD) WITH PROPOFOL;  Surgeon: Wyline Mood, Suzanne Leon;  Location: [facility]COPY;  Service: Gastroenterology;  Laterality: N/A;   EYE SURGERY     LAMINECTOMY     Loop explant     Explanted Biomonitor III loop recorder 10/12/2022   LOOP RECORDER IMPLANT  08/13/2021   Biotronik Biomonitor III  LUMBAR SPINE SURGERY     2010   remote T&A     TONSILLECTOMY      Her Family History Is Significant For: Family History  Problem Relation Leon of Onset   Throat cancer Mother 75   Ulcers Father        peptic   Emphysema Father    Breast cancer Daughter    Thyroid disease Daughter    Migraines Son    Diabetes Neg Hx     Her Social History Is Significant For: Social History   Socioeconomic History   Marital status: Widowed    Spouse name: Not on file   Number of children: 2   Years of education: Not on file   Highest education level: Not on file  Occupational History    Occupation: retired  Tobacco Use   Smoking status: Former    Packs/day: 0.25    Years: 3.00    Additional pack years: 0.00    Total pack years: 0.75    Types: Cigarettes    Quit date: 1960    Years since quitting: 64.3   Smokeless tobacco: Never   Tobacco comments:    when she was a teenager  Vaping Use   Vaping Use: Never used  Substance and Sexual Activity   Alcohol use: No   Drug use: No   Sexual activity: Not on file  Other Topics Concern   Not on file  Social History Narrative   Lives alone, independent. Retired.  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  Drinks coffee.   Walks regularly, rakes    Son, Suzanne Leon lives next door   One grandchild   Attends church   Psych: denies depressed mood or anxiety   Social Determinants of Health   Financial Resource Strain: Low Risk  (10/19/2018)   Overall Financial Resource Strain (CARDIA)    Difficulty of Paying Living Expenses: Not hard at all  Food Insecurity: Not on file  Transportation Needs: No Transportation Needs (10/19/2018)   PRAPARE - Administrator, Civil Service (Medical): No    Lack of Transportation (Non-Medical): No  Physical Activity: Not on file  Stress: Not on file  Social Connections: Not on file    Her Allergies Are:  Allergies  Allergen Reactions   Crestor [Rosuvastatin]     Myalgias   Fosamax [Alendronate]     Bones aching   Mysoline [Primidone]     Dizzy and nausea   Prolia [Denosumab]     Bone aching   Prozac [Fluoxetine]     Fatigue    Sinemet [Carbidopa-Levodopa]     Nausea   Vitamin D Analogs     Stomach/bone pain   Zocor [Simvastatin]     myalgias  :   Her Current Medications Are:  Outpatient Encounter Medications as of 03/21/2023  Medication Sig   amLODipine (NORVASC) 2.5 MG tablet Take 2.5 mg by mouth every evening.   meloxicam (MOBIC) 15 MG tablet Take 15 mg by mouth daily.   propranolol (INDERAL) 10 MG tablet Take 10 mg by mouth daily as needed.   VITAMIN D, CHOLECALCIFEROL, PO  Take by mouth.   [DISCONTINUED] aspirin EC 81 MG tablet Take 81 mg by mouth daily. Swallow whole. (Patient not taking: Reported on 03/21/2023)   No facility-administered encounter medications on file as of 03/21/2023.  :   Review of Systems:  Out of a complete 14 point review of systems, all are reviewed and negative with the exception of these symptoms as  listed below:  Review of Systems  Genitourinary:  Negative for menstrual problem.  Neurological:        Chronic poor quality sleep, fatigue, forgetfulness, small oropharynx.  ESS  4.   FSS 31.    Objective:  Neurological Exam  Physical Exam Physical Examination:   Vitals:   03/21/23 1051  BP: (!) 145/76  Pulse: (!) 56    General Examination: The patient is a very pleasant 82 y.o. female in no acute distress. She appears slender and frail.  She is well-groomed.   HEENT: Normocephalic, atraumatic, pupils are equal, round and reactive to light, extraocular tracking is preserved, face is symmetric with normal facial animation.  Hearing is grossly intact. Face is symmetric with normal facial animation. Speech is clear with no dysarthria noted. There is no hypophonia. There is a side-to-side head tremor.  Neck with FROM. There are no carotid bruits on auscultation. Oropharynx exam reveals: mild mouth dryness, adequate dental hygiene and smaller airway entry noted, Mallampati class I, tonsils absent.  Neck circumference 12-1/2 inches, mild to moderate overbite noted.   Chest: Clear to auscultation without wheezing, rhonchi or crackles noted.  Heart: S1+S2+0, regular and normal without murmurs, rubs or gallops noted.   Abdomen: Soft, non-tender and non-distended.  Extremities: There is no pitting edema in the distal lower extremities bilaterally.   Skin: Warm and dry without trophic changes noted.   Musculoskeletal: exam reveals no obvious joint deformities.   Neurologically:  Mental status: The patient is awake, alert and pays  attention, not elaborate in her history.  Son provides additional details.  Mood is constricted and affect is blunted.  Cranial nerves II - XII are as described above under HEENT exam.  Motor exam: Normal bulk, strength and tone is noted. There is no resting tremor, minimal action tremor in both upper extremities.   Fine motor skills and coordination: grossly intact.  Cerebellar testing: No dysmetria or intention tremor. There is no truncal or gait ataxia.  Sensory exam: intact to light touch in the upper and lower extremities.  Gait, station and balance: She stands easily. No veering to one side is noted. No leaning to one side is noted. Posture is Leon-appropriate and stance is narrow based. Gait shows normal stride length and normal pace. No problems turning are noted.   Assessment and Plan:   In summary, ANANDA CAYA is a very pleasant 82 y.o.-year old female with an underlying medical history of alopecia, tremor, chronic headaches, arthritis, reflux disease, hyperlipidemia, osteoporosis, osteopenia, low back pain, skin cancer, anxiety, and depression, who presents for evaluation of her chronic sleep disturbance including some difficulty initiating sleep and some difficulty maintaining sleep, possible snoring.we talked about sleep testing, I outlined the differences between a laboratory attended sleep study versus home sleep testing.  She declines both.  She is encouraged to think about it.  If she has obstructive sleep apnea I would be happy to help manage it.  She is advised to let us know if she would like to proceed with sleep testing.  Her son wants to help facilitate home sleep testing.  I explained CPAP therapy to the patient and her son, she indicates that she would probably not use a CPAP machine.  We will defer home sleep testing at this time but can revisit upon her request.   I answered all the questions today and the patient and her son were in agreement.  Thank you very much for  allowing me to participate in  the care of this nice patient. If I can be of any further assistance to you please do not hesitate to call me at [phone].  Sincerely,   Suzanne Age, Suzanne Leon, Suzanne Leon

## 2023-06-07 DIAGNOSIS — G4700 Insomnia, unspecified: Secondary | ICD-10-CM | POA: Diagnosis not present

## 2023-06-07 DIAGNOSIS — I2510 Atherosclerotic heart disease of native coronary artery without angina pectoris: Secondary | ICD-10-CM | POA: Diagnosis not present

## 2023-06-07 DIAGNOSIS — K529 Noninfective gastroenteritis and colitis, unspecified: Secondary | ICD-10-CM | POA: Diagnosis not present

## 2023-06-07 DIAGNOSIS — E559 Vitamin D deficiency, unspecified: Secondary | ICD-10-CM | POA: Diagnosis not present

## 2023-06-07 DIAGNOSIS — E440 Moderate protein-calorie malnutrition: Secondary | ICD-10-CM | POA: Diagnosis not present

## 2023-06-07 DIAGNOSIS — E785 Hyperlipidemia, unspecified: Secondary | ICD-10-CM | POA: Diagnosis not present

## 2023-07-25 DIAGNOSIS — R221 Localized swelling, mass and lump, neck: Secondary | ICD-10-CM | POA: Diagnosis not present

## 2023-07-25 DIAGNOSIS — R251 Tremor, unspecified: Secondary | ICD-10-CM | POA: Diagnosis not present

## 2023-07-25 DIAGNOSIS — R1313 Dysphagia, pharyngeal phase: Secondary | ICD-10-CM | POA: Diagnosis not present

## 2023-07-26 ENCOUNTER — Other Ambulatory Visit (HOSPITAL_BASED_OUTPATIENT_CLINIC_OR_DEPARTMENT_OTHER): Payer: Self-pay | Admitting: Otolaryngology

## 2023-07-26 DIAGNOSIS — R1313 Dysphagia, pharyngeal phase: Secondary | ICD-10-CM

## 2023-07-28 ENCOUNTER — Encounter (HOSPITAL_BASED_OUTPATIENT_CLINIC_OR_DEPARTMENT_OTHER): Payer: Self-pay | Admitting: Otolaryngology

## 2023-07-29 ENCOUNTER — Ambulatory Visit
Admission: RE | Admit: 2023-07-29 | Discharge: 2023-07-29 | Disposition: A | Discharge status: Home / Self Care | Payer: Medicare HMO | Source: Ambulatory Visit | Attending: Otolaryngology | Admitting: Otolaryngology

## 2023-07-29 DIAGNOSIS — R221 Localized swelling, mass and lump, neck: Secondary | ICD-10-CM | POA: Diagnosis not present

## 2023-07-29 DIAGNOSIS — R1313 Dysphagia, pharyngeal phase: Secondary | ICD-10-CM

## 2023-07-29 DIAGNOSIS — I700 Atherosclerosis of aorta: Secondary | ICD-10-CM | POA: Diagnosis not present

## 2023-07-29 MED ORDER — IOPAMIDOL (ISOVUE-300) INJECTION 61%
100.0000 mL | Freq: Once | INTRAVENOUS | Status: AC | PRN
  Administered 2023-07-29: 75 mL via INTRAVENOUS

## 2023-08-15 ENCOUNTER — Other Ambulatory Visit: Payer: Medicare HMO

## 2023-08-22 ENCOUNTER — Ambulatory Visit (INDEPENDENT_AMBULATORY_CARE_PROVIDER_SITE_OTHER): Payer: Medicare HMO | Admitting: Family Medicine

## 2023-08-22 ENCOUNTER — Encounter: Payer: Self-pay | Admitting: Family Medicine

## 2023-08-22 VITALS — BP 134/64 | HR 62 | Temp 97.7°F | Resp 16 | Ht 68.0 in | Wt 109.2 lb

## 2023-08-22 DIAGNOSIS — I10 Essential (primary) hypertension: Secondary | ICD-10-CM

## 2023-08-22 DIAGNOSIS — K219 Gastro-esophageal reflux disease without esophagitis: Secondary | ICD-10-CM | POA: Diagnosis not present

## 2023-08-22 DIAGNOSIS — E559 Vitamin D deficiency, unspecified: Secondary | ICD-10-CM | POA: Diagnosis not present

## 2023-08-22 DIAGNOSIS — G4700 Insomnia, unspecified: Secondary | ICD-10-CM

## 2023-08-22 DIAGNOSIS — R09A2 Foreign body sensation, throat: Secondary | ICD-10-CM | POA: Diagnosis not present

## 2023-08-22 DIAGNOSIS — R634 Abnormal weight loss: Secondary | ICD-10-CM

## 2023-08-22 DIAGNOSIS — E538 Deficiency of other specified B group vitamins: Secondary | ICD-10-CM | POA: Diagnosis not present

## 2023-08-22 DIAGNOSIS — G250 Essential tremor: Secondary | ICD-10-CM | POA: Diagnosis not present

## 2023-08-22 DIAGNOSIS — K5900 Constipation, unspecified: Secondary | ICD-10-CM

## 2023-08-22 MED ORDER — PANTOPRAZOLE SODIUM 40 MG PO TBEC: 40.0000 mg | DELAYED_RELEASE_TABLET | Freq: Every day | ORAL | 0 refills | Status: DC

## 2023-08-22 NOTE — Patient Instructions (Addendum)
It was a pleasure meeting you today. Thank you for allowing me to take part in your health care.  Our goals for today as we discussed include:  Start Protonix 40 mg daily  Start Metamucil daily to help regulate bowels  Will review chart for recommendations  If you have any questions or concerns, please do not hesitate to call the office at [phone].  I look forward to our next visit and until then take care and stay safe.  Regards,   Dana Allan, MD   [facility]

## 2023-08-22 NOTE — Progress Notes (Signed)
SUBJECTIVE:   Chief Complaint  Patient presents with   Establish Care   HPI Presents to clinic with son to establish care  Concern to for difficulty swallowing Issue has been ongoing for years. Son reports was recently evaluated by ENT at [facility] and nothing was seen on CT neck. Reports last EGD was normal. Reports was previously seen by Dr Tobi Bastos, and esophagus was dilated.  Reports told at that time may need to be repeated in future.  She continues to have feeling of something caught in throat. Having difficulty swallowing solids and liquids.  She especially has difficulty with swallowing medications.  She does endorse having no decrease in appetite despite unintentional weight loss.  Son indicated a loss of abut 30 lbs in that past couple of years.  Denies any fevers, abdominal pain, nausea, vomiting, diarrhea.  Endorses some constipation and straining with BM.  No bloody stool or urinary symptoms. Also endorses having change in stool color that has since resolved with cutting out gluten in diet.  Reports was told gluten work up was normal.  Review of chart  CT necks 07/29/23- no abnormalities, Aortic Atherosclerosis, unchanged 6 mm nodule in  posterior LUL, consistent with benign etiology and no f/u imaging recommended EGD 02/14/2023- Moderate GERD, mild esophageal dysmotility r/t chronic GERD.  No mass or stricture noted. No aspiration.  Reviewed note from Dr Tobi Bastos , 06/22/2021 Esophageal manometry in 2014 showing increased LES pressure, hx esophageal dilation 2013, 2022.  Ba Swallow 2021 normal, no reflux or esophageal strictures or dysmotility at that time.   She was seen [facility] and FEES at that time showed slight vs mild pharyngeal dysphagia of unknown etiology, likely presbyphyagia.  There was also laryngeal tremors, vocal fold atrophy and right vocal fold hypermobility.  She then underwent a Neurological evaluation with Dr Nita Sickle and was  work did not show any evidence of neurological basis for symptoms.  Patient and son would like to be referred back to Dr Tobi Bastos for follow up.   HTN Asymptomatic. BP at home 126/60's. Taking Amlodipine 2.5 mg daily and tolerating well  Essential Tremor Managed with Propranolol 10 mg Tues, Wed and Sunday.  Has been evaluated by [facility] for sleep disorder.  She declined laboratory and home sleep study for sleep apnea. Endorsed that likely would not uses CPAP but in the event that decides to be tested she can call [facility] clinic to be reevaluated.    Chronic back pain Prescribed Meloxicam 15 mg daily.  Not sure how much taking but endorses not every day.  Not currently on PPI.  For geriatric population recommended dose 7.5 mg daily or alternate.  Will have son check to see how mucj she is using at home.  For OA would recommend Tylenol Arthritis 2-3 times daily if no contraindications.  PERTINENT PMH / PSH: As above  OBJECTIVE:  BP 134/64   Pulse 62   Temp 97.7 F (36.5 C)   Resp 16   Ht 5\' 8"  (1.727 m)   Wt 109 lb 4 oz (49.6 kg)   SpO2 100%   BMI 16.61 kg/m    Physical Exam Vitals reviewed.  Constitutional:      General: She is not in acute distress.    Appearance: She is not ill-appearing.  HENT:     Head: Normocephalic.     Right Ear: Tympanic membrane, ear canal and external ear normal.     Left Ear: Tympanic membrane, ear  canal and external ear normal.     Nose: Nose normal.     Mouth/Throat:     Mouth: Mucous membranes are moist.  Eyes:     Extraocular Movements: Extraocular movements intact.     Conjunctiva/sclera: Conjunctivae normal.     Pupils: Pupils are equal, round, and reactive to light.  Neck:     Thyroid: No thyromegaly or thyroid tenderness.     Vascular: No carotid bruit.  Cardiovascular:     Rate and Rhythm: Normal rate and regular rhythm.     Pulses: Normal pulses.     Heart sounds: Normal heart sounds.  Pulmonary:     Effort: Pulmonary effort is normal.      Breath sounds: Normal breath sounds.  Abdominal:     General: Bowel sounds are normal. There is no distension.     Palpations: Abdomen is soft.     Tenderness: There is no abdominal tenderness. There is no right CVA tenderness, left CVA tenderness, guarding or rebound.  Musculoskeletal:        General: Normal range of motion.     Cervical back: Normal range of motion.     Right lower leg: No edema.     Left lower leg: No edema.  Lymphadenopathy:     Cervical: No cervical adenopathy.  Skin:    Capillary Refill: Capillary refill takes less than 2 seconds.  Neurological:     General: No focal deficit present.     Mental Status: She is alert and oriented to person, place, and time. Mental status is at baseline.     Motor: No weakness.  Psychiatric:        Mood and Affect: Mood normal.        Behavior: Behavior normal.        Thought Content: Thought content normal.        Judgment: Judgment normal.        08/22/2023    3:01 PM  Depression screen PHQ 2/9  Decreased Interest 0  Down, Depressed, Hopeless 0  PHQ - 2 Score 0  Altered sleeping 3  Tired, decreased energy 1  Change in appetite 0  Feeling bad or failure about yourself  0  Trouble concentrating 0  Moving slowly or fidgety/restless 0  Suicidal thoughts 0  PHQ-9 Score 4  Difficult doing work/chores Somewhat difficult      08/22/2023    3:01 PM  GAD 7 : Generalized Anxiety Score  Nervous, Anxious, on Edge 1  Control/stop worrying 0  Worry too much - different things 0  Trouble relaxing 0  Restless 0  Easily annoyed or irritable 1  Afraid - awful might happen 0  Total GAD 7 Score 2  Anxiety Difficulty Not difficult at all    ASSESSMENT/PLAN:  Gastroesophageal reflux disease without esophagitis Assessment & Plan: EGD 02/14/2023- Moderate GERD, mild esophageal dysmotility r/t chronic GERD.  No mass or stricture noted. No aspiration. Start Protonix 40 mg daily   Orders: -     Pantoprazole Sodium; Take  1 tablet (40 mg total) by mouth daily.  Dispense: 90 tablet; Refill: 0 -     Ambulatory referral to Gastroenterology  Insomnia, unspecified type Assessment & Plan: Antony Blackbird by [facility] Recommended sleep studies. Patient declined at that time Can call [facility] if decides to revisit studies in future   Essential tremor Assessment & Plan: Chronic. Stable Takes BB 3 x week Continue Propranolol 10 mg S/W/Th  Follow up as needed   Essential hypertension  Assessment & Plan: Chronic Well controlled Continue Amlodipine 2.5 mg daily   Orders: -     Comprehensive metabolic panel; Future -     Lipid panel; Future  Constipation, unspecified constipation type Assessment & Plan: No decrease in appetite but does poor po intake secondary to globus sensation Start Metamucil daily Previously tried Miralax but had loose stool.  Orders: -     Ambulatory referral to Gastroenterology  Weight loss Assessment & Plan: Reports 30 lbs unintentional weight loss in 2 years. Likely secondary to poor po intake Will review chart and plan for referral GI Restart Protonix Follow up in 1-2 weeks   Orders: -     CBC with Differential/Platelet; Future -     TSH; Future -     Ambulatory referral to Gastroenterology  Vitamin D deficiency -     VITAMIN D 25 Hydroxy (Vit-D Deficiency, Fractures); Future  Vitamin B 12 deficiency -     Vitamin B12; Future  Globus sensation -     Ambulatory referral to Gastroenterology   Decrease Meloxicam to 7.5 mg. Will likely discontinue in future   Declined Pneumonia, Shingles, Flu vaccine  PDMP reviewed  Return if symptoms worsen or fail to improve, for PCP.  Dana Allan, MD

## 2023-08-24 ENCOUNTER — Encounter: Payer: Self-pay | Admitting: Family Medicine

## 2023-08-28 ENCOUNTER — Encounter: Payer: Self-pay | Admitting: Family Medicine

## 2023-08-28 DIAGNOSIS — K219 Gastro-esophageal reflux disease without esophagitis: Secondary | ICD-10-CM | POA: Insufficient documentation

## 2023-08-28 DIAGNOSIS — E559 Vitamin D deficiency, unspecified: Secondary | ICD-10-CM | POA: Insufficient documentation

## 2023-08-28 DIAGNOSIS — E538 Deficiency of other specified B group vitamins: Secondary | ICD-10-CM | POA: Insufficient documentation

## 2023-08-28 DIAGNOSIS — K5900 Constipation, unspecified: Secondary | ICD-10-CM | POA: Insufficient documentation

## 2023-08-28 NOTE — Assessment & Plan Note (Signed)
EGD 02/14/2023- Moderate GERD, mild esophageal dysmotility r/t chronic GERD.  No mass or stricture noted. No aspiration. Start Protonix 40 mg daily

## 2023-08-28 NOTE — Assessment & Plan Note (Signed)
No decrease in appetite but does poor po intake secondary to globus sensation Start Metamucil daily Previously tried Miralax but had loose stool.

## 2023-08-28 NOTE — Assessment & Plan Note (Signed)
Chronic Well controlled Continue Amlodipine 2.5 mg daily

## 2023-08-28 NOTE — Assessment & Plan Note (Signed)
Chronic. Stable Takes BB 3 x week Continue Propranolol 10 mg S/W/Th  Follow up as needed

## 2023-08-28 NOTE — Assessment & Plan Note (Signed)
Reports 30 lbs unintentional weight loss in 2 years. Likely secondary to poor po intake Will review chart and plan for referral GI Restart Protonix Follow up in 1-2 weeks

## 2023-08-28 NOTE — Assessment & Plan Note (Signed)
Senn by [facility] Recommended sleep studies. Patient declined at that time Can call [facility] if decides to revisit studies in future

## 2023-08-29 NOTE — Telephone Encounter (Signed)
Called pt and scheduled lab and office visit. Also informed her about the referral and to continue medication Protonix.

## 2023-08-29 NOTE — Addendum Note (Signed)
Addended by: Enid Cutter on: 08/29/2023 09:44 AM   Modules accepted: Orders

## 2023-09-21 ENCOUNTER — Other Ambulatory Visit: Payer: Medicare HMO

## 2023-09-21 DIAGNOSIS — E559 Vitamin D deficiency, unspecified: Secondary | ICD-10-CM | POA: Diagnosis not present

## 2023-09-21 DIAGNOSIS — R634 Abnormal weight loss: Secondary | ICD-10-CM | POA: Diagnosis not present

## 2023-09-21 DIAGNOSIS — E538 Deficiency of other specified B group vitamins: Secondary | ICD-10-CM | POA: Diagnosis not present

## 2023-09-21 DIAGNOSIS — I10 Essential (primary) hypertension: Secondary | ICD-10-CM

## 2023-09-21 LAB — CBC WITH DIFFERENTIAL/PLATELET
Basophils Absolute: 0 10*3/uL (ref 0.0–0.1)
Basophils Relative: 0.5 % (ref 0.0–3.0)
Eosinophils Absolute: 0.1 10*3/uL (ref 0.0–0.7)
Eosinophils Relative: 1.9 % (ref 0.0–5.0)
HCT: 37.7 % (ref 36.0–46.0)
Hemoglobin: 12.4 g/dL (ref 12.0–15.0)
Lymphocytes Relative: 43 % (ref 12.0–46.0)
Lymphs Abs: 1.8 10*3/uL (ref 0.7–4.0)
MCHC: 32.8 g/dL (ref 30.0–36.0)
MCV: 94.8 fL (ref 78.0–100.0)
Monocytes Absolute: 0.4 10*3/uL (ref 0.1–1.0)
Monocytes Relative: 8.4 % (ref 3.0–12.0)
Neutro Abs: 1.9 10*3/uL (ref 1.4–7.7)
Neutrophils Relative %: 46.2 % (ref 43.0–77.0)
Platelets: 182 10*3/uL (ref 150.0–400.0)
RBC: 3.98 Mil/uL (ref 3.87–5.11)
RDW: 14.1 % (ref 11.5–15.5)
WBC: 4.2 10*3/uL (ref 4.0–10.5)

## 2023-09-21 LAB — COMPREHENSIVE METABOLIC PANEL
ALT: 16 U/L (ref 0–35)
AST: 23 U/L (ref 0–37)
Albumin: 4.3 g/dL (ref 3.5–5.2)
Alkaline Phosphatase: 80 U/L (ref 39–117)
BUN: 14 mg/dL (ref 6–23)
CO2: 30 meq/L (ref 19–32)
Calcium: 9.6 mg/dL (ref 8.4–10.5)
Chloride: 103 meq/L (ref 96–112)
Creatinine, Ser: 1.02 mg/dL (ref 0.40–1.20)
GFR: 51.27 mL/min — ABNORMAL LOW (ref >60.00)
Glucose, Bld: 97 mg/dL (ref 70–99)
Potassium: 4.2 meq/L (ref 3.5–5.1)
Sodium: 139 meq/L (ref 135–145)
Total Bilirubin: 0.7 mg/dL (ref 0.2–1.2)
Total Protein: 6.9 g/dL (ref 6.0–8.3)

## 2023-09-21 LAB — LIPID PANEL
Cholesterol: 259 mg/dL — ABNORMAL HIGH (ref 0–200)
HDL: 76.1 mg/dL (ref >39.00)
LDL Cholesterol: 168 mg/dL — ABNORMAL HIGH (ref 0–99)
NonHDL: 182.64
Total CHOL/HDL Ratio: 3
Triglycerides: 75 mg/dL (ref 0.0–149.0)
VLDL: 15 mg/dL (ref 0.0–40.0)

## 2023-09-21 LAB — VITAMIN D 25 HYDROXY (VIT D DEFICIENCY, FRACTURES): VITD: 25.9 ng/mL — ABNORMAL LOW (ref 30.00–100.00)

## 2023-09-21 LAB — TSH: TSH: 1.58 u[IU]/mL (ref 0.35–5.50)

## 2023-09-21 LAB — VITAMIN B12: Vitamin B-12: 1145 pg/mL — ABNORMAL HIGH (ref 211–911)

## 2023-09-30 ENCOUNTER — Encounter: Payer: Self-pay | Admitting: Family Medicine

## 2023-09-30 ENCOUNTER — Ambulatory Visit (INDEPENDENT_AMBULATORY_CARE_PROVIDER_SITE_OTHER): Payer: Medicare HMO | Admitting: Family Medicine

## 2023-09-30 VITALS — BP 130/68 | HR 52 | Temp 97.7°F | Resp 16 | Ht 68.0 in | Wt 106.1 lb

## 2023-09-30 DIAGNOSIS — K219 Gastro-esophageal reflux disease without esophagitis: Secondary | ICD-10-CM

## 2023-09-30 DIAGNOSIS — R634 Abnormal weight loss: Secondary | ICD-10-CM | POA: Diagnosis not present

## 2023-09-30 DIAGNOSIS — Z8739 Personal history of other diseases of the musculoskeletal system and connective tissue: Secondary | ICD-10-CM | POA: Diagnosis not present

## 2023-09-30 DIAGNOSIS — E559 Vitamin D deficiency, unspecified: Secondary | ICD-10-CM | POA: Diagnosis not present

## 2023-09-30 MED ORDER — VITAMIN D (ERGOCALCIFEROL) 1.25 MG (50000 UNIT) PO CAPS
50000.0000 [IU] | ORAL_CAPSULE | ORAL | 1 refills | Status: DC
Start: 2023-09-30 — End: 2024-04-05

## 2023-09-30 MED ORDER — FAMOTIDINE 20 MG PO TABS
20.0000 mg | ORAL_TABLET | Freq: Two times a day (BID) | ORAL | 3 refills | Status: DC
Start: 2023-09-30 — End: 2023-12-06

## 2023-09-30 NOTE — Patient Instructions (Signed)
It was a pleasure meeting you today. Thank you for allowing me to take part in your health care.  Our goals for today as we discussed include:  Continue Protonix 40 mg daily Start Pepcid 20 mg two times a day  Increase Ensure to 1-2 cans daily  Follow up with GI as scheduled  Follow up as needed  This is a list of the screening recommended for you and due dates:  Health Maintenance  Topic Date Due   Zoster (Shingles) Vaccine (1 of 2) Never done   Pneumonia Vaccine (1 of 1 - PCV) Never done   DEXA scan (bone density measurement)  Never done   COVID-19 Vaccine (1 - 2023-24 season) Never done   Flu Shot  03/05/2024*   Medicare Annual Wellness Visit  01/25/2024   DTaP/Tdap/Td vaccine (2 - Td or Tdap) 07/24/2025   HPV Vaccine  Aged Out  *Topic was postponed. The date shown is not the original due date.      If you have any questions or concerns, please do not hesitate to call the office at [phone].  I look forward to our next visit and until then take care and stay safe.  Regards,   Dana Allan, MD   [facility]

## 2023-09-30 NOTE — Progress Notes (Unsigned)
   SUBJECTIVE:   Chief Complaint  Patient presents with  . Medical Management of Chronic Issues   HPI ***  PERTINENT PMH / PSH: ***  OBJECTIVE:  BP 130/68   Pulse (!) 52   Temp 97.7 F (36.5 C)   Resp 16   Ht 5\' 8"  (1.727 m)   Wt 106 lb 2 oz (48.1 kg)   SpO2 100%   BMI 16.14 kg/m    Physical Exam     08/22/2023    3:01 PM  Depression screen PHQ 2/9  Decreased Interest 0  Down, Depressed, Hopeless 0  PHQ - 2 Score 0  Altered sleeping 3  Tired, decreased energy 1  Change in appetite 0  Feeling bad or failure about yourself  0  Trouble concentrating 0  Moving slowly or fidgety/restless 0  Suicidal thoughts 0  PHQ-9 Score 4  Difficult doing work/chores Somewhat difficult      08/22/2023    3:01 PM  GAD 7 : Generalized Anxiety Score  Nervous, Anxious, on Edge 1  Control/stop worrying 0  Worry too much - different things 0  Trouble relaxing 0  Restless 0  Easily annoyed or irritable 1  Afraid - awful might happen 0  Total GAD 7 Score 2  Anxiety Difficulty Not difficult at all    ASSESSMENT/PLAN:  There are no diagnoses linked to this encounter. PDMP reviewed***  No follow-ups on file.  Dana Allan, MD

## 2023-10-02 ENCOUNTER — Encounter: Payer: Self-pay | Admitting: Family Medicine

## 2023-10-02 DIAGNOSIS — Z8739 Personal history of other diseases of the musculoskeletal system and connective tissue: Secondary | ICD-10-CM | POA: Insufficient documentation

## 2023-10-02 MED ORDER — ACETAMINOPHEN ER 650 MG PO TBCR: 650.0000 mg | EXTENDED_RELEASE_TABLET | Freq: Three times a day (TID) | ORAL | Status: DC | PRN

## 2023-10-02 NOTE — Assessment & Plan Note (Signed)
Concerns about side effects of Meloxicam. -Discontinue Meloxicam. -Recommend trial of Tylenol 650 mg three times a day for arthritis pain management -Consider physical therapy  -Continue to remain mobile.

## 2023-10-02 NOTE — Assessment & Plan Note (Signed)
Ongoing weight loss, possibly secondary to swallowing difficulties and dietary changes due to GERD. -Encourage increased caloric intake, consider meal replacement drinks such as Ensure. -Follow-up with GI specialist for further evaluation of weight loss.

## 2023-10-02 NOTE — Assessment & Plan Note (Signed)
Persistent difficulty swallowing despite Protonix therapy. Some improvement noted. Referral to GI specialist  already in place for further evaluation. -Continue Protonix. -Add Pepcid twice daily to regimen. -Encourage dietary modifications for GERD.

## 2023-10-02 NOTE — Assessment & Plan Note (Signed)
Lab results indicate low Vitamin D levels. -Start Vitamin D supplementation, one tablet weekly for six months.

## 2023-10-06 DIAGNOSIS — R1313 Dysphagia, pharyngeal phase: Secondary | ICD-10-CM

## 2023-10-06 HISTORY — DX: Dysphagia, pharyngeal phase: R13.13

## 2023-10-09 NOTE — Progress Notes (Unsigned)
Suzanne Amy, PA-C [address]  [address]  [place], [place] [address]  Main: [phone]  Fax: [phone]   Primary Care Physician: Dana Allan, MD  Primary Gastroenterologist:  Suzanne Amy, PA-C / Dr. Wyline Mood    CC:  F/U chronic dysphagia and GERD  HPI: Suzanne Leon is a 82 y.o. female patient returns for follow-up of chronic dysphagia and GERD.  She last saw Dr. Tobi Leon in 2022 to evaluate dysphagia.  She is here today with her son who helps with her care.  In 2014 she underwent an esophageal manometry study and only abnormality seen was increased LES pressure.  An upper endoscopy into December 2013 demonstrated no clear stricture but she was empirically dilated to 49 [place] underwent barium swallow in May 2021 that showed no abnormality.  CT chest at the same time also showed no abnormality of the esophagus.  She has a history of GERD but has had issues with solid food dysphagia for many years since at least 2013.  Has lost weight due to inability to eat.  She saw neurologist Dr. Allena Katz in 2020 who ruled out Parkinson's disease.  Has also been evaluated at [facility] for dysphagia many years ago.  EGD 06/2021 was normal.  She underwent empiric esophageal dilation to 18 mm.  Esophageal biopsies negative for eosinophilic esophagitis.  Barium swallow with tablet 02/2023 showed moderate GERD, mild esophageal dysmotility characteristic of chronic reflux.  No hiatal hernia.  No evidence of esophageal mass or stricture.    She is currently taking famotidine 20 Mg twice daily and pantoprazole 40 Mg once daily.  Current symptoms: She continues to have globus sensation and difficulty swallowing pills which gets stuck in her throat.  She feels like there is a ball in her throat.  She was started on pantoprazole 40 Mg once daily 1 week ago by her PCP.  It is difficult to swallow her pill and she does not like taking medication.  She denies chest pain or heartburn.  She admits to  voice hoarseness and has difficulty singing.  She has chronic constipation and is not currently taking any treatment.  Admits to hard stools and straining.  Current Outpatient Medications  Medication Sig Dispense Refill   acetaminophen (TYLENOL) 650 MG CR tablet Take 1 tablet (650 mg total) by mouth every 8 (eight) hours as needed for pain.     amLODipine (NORVASC) 2.5 MG tablet Take 2.5 mg by mouth every evening.     famotidine (PEPCID) 20 MG tablet Take 1 tablet (20 mg total) by mouth 2 (two) times daily. 60 tablet 3   lansoprazole (PREVACID) 30 MG capsule Take 1 capsule (30 mg total) by mouth daily before breakfast. Open Capsule and Mix in yogurt, pudding, applesauce, or liquid. 90 capsule 3   meloxicam (MOBIC) 7.5 MG tablet Take 7.5 mg by mouth daily.     polyethylene glycol powder (GLYCOLAX/MIRALAX) 17 GM/SCOOP powder Take 17 g by mouth daily.     propranolol (INDERAL) 10 MG tablet Take 10 mg by mouth daily as needed.     SV MAGNESIUM PO Take 660 mg by mouth daily.     Vitamin D, Ergocalciferol, (DRISDOL) 1.25 MG (50000 UNIT) CAPS capsule Take 1 capsule (50,000 Units total) by mouth every 7 (seven) days. 12 capsule 1   No current facility-administered medications for this visit.    Allergies as of 10/10/2023 - Review Complete 10/10/2023  Allergen Reaction Noted   Crestor [rosuvastatin]  02/22/2023  Fosamax [alendronate]  02/22/2023   Mysoline [primidone]  02/22/2023   Prolia [denosumab]  02/22/2023   Prozac [fluoxetine]  02/22/2023   Sinemet [carbidopa-levodopa]  02/22/2023   Vitamin d analogs  02/22/2023   Zocor [simvastatin]  02/22/2023    Past Medical History:  Diagnosis Date   Alopecia totalis 10/27/2016   Anxiety    Arthritis    Chronic headaches    Depression    Dizziness    Educated about COVID-19 virus infection 04/02/2019   Essential tremor    HEAD. takes propranolol as needed.   Fatigue    GERD (gastroesophageal reflux disease)    HLD (hyperlipidemia)     Internal hemorrhoids    Low back pain    Osteopenia    Osteoporosis    Other specified disorders of nose and nasal sinuses 07/22/2017   Pharyngeal dysphagia 10/06/2023   Skin cancer    skin cancer   Weight loss 10/19/2018    Past Surgical History:  Procedure Laterality Date   ABDOMINAL HYSTERECTOMY     APPENDECTOMY     COLONOSCOPY     ESOPHAGEAL MANOMETRY  12/18/2012   Procedure: ESOPHAGEAL MANOMETRY (EM);  Surgeon: Hart Carwin, MD;  Location: [facility];  Service: Endoscopy;  Laterality: N/A;   ESOPHAGOGASTRODUODENOSCOPY (EGD) WITH PROPOFOL N/A 06/15/2021   Procedure: ESOPHAGOGASTRODUODENOSCOPY (EGD) WITH PROPOFOL;  Surgeon: Wyline Mood, MD;  Location: [facility] END[facility]COPY;  Service: Gastroenterology;  Laterality: N/A;   EYE SURGERY     LAMINECTOMY     Loop explant     Explanted Biomonitor III loop recorder 10/12/2022   LOOP RECORDER IMPLANT  08/13/2021   Biotronik Biomonitor III   LUMBAR SPINE SURGERY     2010   remote T&A     TONSILLECTOMY      Review of Systems:    All systems reviewed and negative except where noted in HPI.   Physical Examination:   BP 118/66   Pulse 61   Temp 97.7 F (36.5 C)   Ht 5\' 8"  (1.727 m)   Wt 105 lb 12.8 oz (48 kg)   BMI 16.09 kg/m   General: Very thin elderly frail female, in no acute distress.  Lungs: Clear to auscultation bilaterally. Non-labored. Heart: Regular rate and rhythm, no murmurs rubs or gallops.  Abdomen: Bowel sounds are normal; Abdomen is thin and soft; No hepatosplenomegaly, masses or hernias;  No Abdominal Tenderness; No guarding or rebound tenderness. Neuro: Alert and oriented x 3.  Grossly intact.  She has a resting tremor. Psych: Alert and cooperative, normal mood and affect.   Imaging Studies: No results found.  Assessment and Plan:   Suzanne Leon is a 81 y.o. y/o female presents for follow-up of chronic dysphagia and chronic GERD.  Barium swallow 02/2023 showed no evidence of esophageal masses,  stricture, or hiatal hernia.  Moderate GERD.  Mild esophageal dysmotility.  EGD in 2022 was normal.  Biopsies negative for eosinophilic esophagitis.  1.  Chronic GERD / Laryngeal pharyngeal reflux disease (LPR)  Stop pantoprazole (difficult to swallow pills).  Start Prevacid (lansoprazole) 1 capsule once daily.  Open capsule and mix in a drink, yogurt, pudding, or applesauce.  Recommend Lifestyle Modifications to prevent Acid Reflux.  Rec. Avoid coffee, sodas, peppermint, citrus fruits, and spicey foods.  Avoid eating 2-3 hours before bedtime.   2.  Chronic pharyngeal dysphagia; suspect due to underlying chronic GERD / LPR  Reassurance regarding barium swallow and EGD results.  3.  Chronic constipation  Start MiraLAX,  mix 1 capful in a drink once daily every day.  Suzanne Amy, PA-C  Follow up in 3 months.

## 2023-10-10 ENCOUNTER — Ambulatory Visit: Payer: Medicare HMO | Admitting: Physician Assistant

## 2023-10-10 ENCOUNTER — Encounter: Payer: Self-pay | Admitting: Physician Assistant

## 2023-10-10 VITALS — BP 118/66 | HR 61 | Temp 97.7°F | Ht 68.0 in | Wt 105.8 lb

## 2023-10-10 DIAGNOSIS — R1313 Dysphagia, pharyngeal phase: Secondary | ICD-10-CM | POA: Diagnosis not present

## 2023-10-10 DIAGNOSIS — K219 Gastro-esophageal reflux disease without esophagitis: Secondary | ICD-10-CM

## 2023-10-10 DIAGNOSIS — R09A2 Foreign body sensation, throat: Secondary | ICD-10-CM

## 2023-10-10 DIAGNOSIS — K5909 Other constipation: Secondary | ICD-10-CM

## 2023-10-10 DIAGNOSIS — K5904 Chronic idiopathic constipation: Secondary | ICD-10-CM

## 2023-10-10 DIAGNOSIS — K224 Dyskinesia of esophagus: Secondary | ICD-10-CM | POA: Diagnosis not present

## 2023-10-10 MED ORDER — POLYETHYLENE GLYCOL 3350 17 GM/SCOOP PO POWD: 17.0000 g | Freq: Every day | ORAL | Status: AC

## 2023-10-10 MED ORDER — LANSOPRAZOLE 30 MG PO CPDR: 30.0000 mg | DELAYED_RELEASE_CAPSULE | Freq: Every day | ORAL | 3 refills | Status: DC

## 2023-10-10 NOTE — Patient Instructions (Signed)
For laryngeal pharyngeal reflux disease (silent acid reflux): Stop pantoprazole. Start lansoprazole (Prevacid) 30 mg 1 capsule once daily. Open capsule and mix in a drink, pudding, applesauce, or yogurt daily.  For constipation, take MiraLAX powder, mix 1 capful in a drink once daily.

## 2023-11-07 ENCOUNTER — Other Ambulatory Visit: Payer: Self-pay | Admitting: Family Medicine

## 2023-11-07 DIAGNOSIS — K219 Gastro-esophageal reflux disease without esophagitis: Secondary | ICD-10-CM

## 2023-11-09 ENCOUNTER — Telehealth: Payer: Self-pay | Admitting: Family Medicine

## 2023-11-09 ENCOUNTER — Encounter: Payer: Self-pay | Admitting: Family Medicine

## 2023-11-09 ENCOUNTER — Ambulatory Visit: Payer: Medicare HMO | Admitting: Family Medicine

## 2023-11-09 VITALS — BP 140/80 | HR 57 | Temp 98.1°F | Ht 68.0 in | Wt 105.2 lb

## 2023-11-09 DIAGNOSIS — Z8673 Personal history of transient ischemic attack (TIA), and cerebral infarction without residual deficits: Secondary | ICD-10-CM | POA: Diagnosis not present

## 2023-11-09 DIAGNOSIS — E782 Mixed hyperlipidemia: Secondary | ICD-10-CM

## 2023-11-09 DIAGNOSIS — R251 Tremor, unspecified: Secondary | ICD-10-CM

## 2023-11-09 DIAGNOSIS — R4189 Other symptoms and signs involving cognitive functions and awareness: Secondary | ICD-10-CM

## 2023-11-09 DIAGNOSIS — R42 Dizziness and giddiness: Secondary | ICD-10-CM

## 2023-11-09 DIAGNOSIS — I951 Orthostatic hypotension: Secondary | ICD-10-CM | POA: Diagnosis not present

## 2023-11-09 DIAGNOSIS — R2990 Unspecified symptoms and signs involving the nervous system: Secondary | ICD-10-CM | POA: Diagnosis not present

## 2023-11-09 NOTE — Progress Notes (Signed)
Vrinda Heckstall T. Thomasa Heidler, MD, CAQ Sports Medicine [facility] [address] [place], [address]  Phone: [phone]  FAX: [phone]  Suzanne Leon - 82 y.o. female  MRN [id number]  Date of Birth: 12/01/1941  Date: 11/09/2023  PCP: Dana Allan, MD  Referral: Dana Allan, MD  Chief Complaint  Patient presents with   Dizziness   Trouble Communicating    Feels like this is getting progressively worse   Subjective:   Suzanne Leon is a 82 y.o. very pleasant female patient with Body mass index is 16 kg/m. who presents with the following:  I am asked to the see the patient emergently.  She is an 82 year-old patient of Dr. Clent Ridges from [facility].   She is a patient of Yates Decamp from Cardiology, Loop recorder -she saw him after some syncope.  Ultimately, she asked to have her loop recorder removed. She has recently seen ENT and GI.Marland Kitchen  She does have a lot of swallowing difficulties.  Multiple workups. She also saw [facility], Dr. Derald Macleod, 03/2023 for sleep consultation Previously saw Dr. Allena Katz at [facility].  MRI brain, cervical, NCS/EMG.  12/2019 Brain MRI shows old R cerebellar infarction and small white matter disease.  CKD, stage 3 Hyperlipidemia, not on statin HTN  Cardiovascular risk factors include prior stroke, hypertension, untreated hyperlipidemia  Recent B12 and TSH normal. All recent laboratories are reviewed  She has a difficult time trying to describe the sensations that she is having.  She feels as if everything is going crazy around her.  She notices that things are weird or crazy when she stands from a seated position.  She also has times when she is lying down and everything felt unusual.  She does feel as if the room is moving her around her.  While this has been worse over the last few weeks, she has had intermittent spells such as this for few months. -She also thinks that things get worse when it is dark.  Lives  by herself.  At baseline she is fairly independent and she is able to manage her own household and do her cooking.  Her son does help with the bills.  When she had Covid, she has had some dizzy episodes December 2021, dx covid and dizziness has gotten really bad. Since then, her dizzy spells have been really bad.   Past year, not doing really well and she has disappeared, meaning that she has changed quite a bit at baseline per her son. Normally, she can cook her own meals, more challenging now.  The stove did not make sense to her.  In the last two weeks - had her hand over the stove, and could not really do it.  But not since then.   She has gluten intolerance - not eating that much.  She does not carry a diagnosis of celiac disease.  Went to the doctor, and had concerns about diarrhea and yellow stool.  Son looked up some questions about gluten intolerance.   Could not turn off the windshield wipers in her car -within the last few weeks Trouble working the [employer].  Unplugged her burner in her stove.   Tremor ongoing for years.  She has a notable head tremor as well as to a lesser extent bilateral hand tremor. Walking different in the last year.   Neuro Finger nose abn Command on UE str exam    Review of Systems is noted in the HPI,  as appropriate  Patient Active Problem List   Diagnosis Date Noted   History of cerebellar stroke 11/09/2023   History of arthritis 10/02/2023   GERD (gastroesophageal reflux disease) 08/28/2023   Constipation 08/28/2023   Vitamin B 12 deficiency 08/28/2023   Vitamin D deficiency 08/28/2023   Syncope and collapse 08/14/2021   Loop recorder: Biotronik 08/13/2021 for synocpe 08/14/2021   PVC (premature ventricular contraction) 04/02/2019   Globus sensation 10/19/2018   Essential hypertension 10/19/2018   Weight loss 10/19/2018   Insomnia 10/27/2016   Essential tremor 10/27/2016    Past Medical History:  Diagnosis Date   Alopecia totalis  10/27/2016   Anxiety    Arthritis    Chronic headaches    Depression    Dizziness    Educated about COVID-19 virus infection 04/02/2019   Essential tremor    HEAD. takes propranolol as needed.   Fatigue    GERD (gastroesophageal reflux disease)    HLD (hyperlipidemia)    Internal hemorrhoids    Low back pain    Osteopenia    Osteoporosis    Other specified disorders of nose and nasal sinuses 07/22/2017   Pharyngeal dysphagia 10/06/2023   Skin cancer    skin cancer   Weight loss 10/19/2018    Past Surgical History:  Procedure Laterality Date   ABDOMINAL HYSTERECTOMY     APPENDECTOMY     COLONOSCOPY     ESOPHAGEAL MANOMETRY  12/18/2012   Procedure: ESOPHAGEAL MANOMETRY (EM);  Surgeon: Hart Carwin, MD;  Location: [facility];  Service: Endoscopy;  Laterality: N/A;   ESOPHAGOGASTRODUODENOSCOPY (EGD) WITH PROPOFOL N/A 06/15/2021   Procedure: ESOPHAGOGASTRODUODENOSCOPY (EGD) WITH PROPOFOL;  Surgeon: Wyline Mood, MD;  Location: [facility] ENDOSCOPY;  Service: Gastroenterology;  Laterality: N/A;   EYE SURGERY     LAMINECTOMY     Loop explant     Explanted Biomonitor III loop recorder 10/12/2022   LOOP RECORDER IMPLANT  08/13/2021   Biotronik Biomonitor III   LUMBAR SPINE SURGERY     2010   remote T&A     TONSILLECTOMY      Family History  Problem Relation Age of Onset   Throat cancer Mother 23   Ulcers Father        peptic   Emphysema Father    Breast cancer Daughter    Thyroid disease Daughter    Migraines Son    Diabetes Neg Hx     Social History   Social History Narrative   Lives alone, independent. Retired.  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  Drinks coffee.   Walks regularly, rakes    Son, [name] lives next door   One grandchild   Attends church   Psych: denies depressed mood or anxiety     Objective:   BP (!) 140/80 (BP Location: Left Arm, Patient Position: Sitting, Cuff Size: Normal)   Pulse (!) 57   Temp 98.1 F (36.7 C) (Temporal)   Ht 5\' 8"  (1.727 m)   Wt  105 lb 4 oz (47.7 kg)   SpO2 99%   BMI 16.00 kg/m   Orthostatic VS for the past 24 hrs:  BP- Lying Pulse- Lying BP- Sitting Pulse- Sitting BP- Standing at 0 minutes Pulse- Standing at 0 minutes  11/09/23 1513 152/75 50 133/70 56 131/72 58      GEN: no acute distress. HEENT: Atraumatic, Normocephalic.  Ears and Nose: No external deformity. CV: RRR, No M/G/R. No JVD. No thrill. No extra heart sounds. PULM: CTA B, no wheezes, crackles,  rhonchi. No retractions. No resp. distress. No accessory muscle use. EXTR: No c/c/e PSYCH: Normally interactive. Conversant.  FLAT affect   Neuro: CN 2-12 grossly intact. PERRLA. EOMI. Sensation intact throughout. DTR 2+. No clonus. A and o x 4. Romberg neg. Finger nose abnormal. Heel -shin neg.  She requires extensive redirection throughout much of the neurological exam. Extensive redirection and examples to complete strength exam Unsteady on heel shin  Laboratory and Imaging Data: Results for orders placed or performed in visit on 09/21/23  Vitamin B12  Result Value Ref Range   Vitamin B-12 1,145 (H) 211 - 911 pg/mL  VITAMIN D 25 Hydroxy (Vit-D Deficiency, Fractures)  Result Value Ref Range   VITD 25.90 (L) 30.00 - 100.00 ng/mL  TSH  Result Value Ref Range   TSH 1.58 0.35 - 5.50 uIU/mL  Lipid panel  Result Value Ref Range   Cholesterol 259 (H) 0 - 200 mg/dL   Triglycerides 81.1 0.0 - 149.0 mg/dL   HDL 91.47 >82.95 mg/dL   VLDL 62.1 0.0 - 30.8 mg/dL   LDL Cholesterol 657 (H) 0 - 99 mg/dL   Total CHOL/HDL Ratio 3    NonHDL 182.64   CBC with Differential/Platelet  Result Value Ref Range   WBC 4.2 4.0 - 10.5 K/uL   RBC 3.98 3.87 - 5.11 Mil/uL   Hemoglobin 12.4 12.0 - 15.0 g/dL   HCT 84.6 96.2 - 95.2 %   MCV 94.8 78.0 - 100.0 fl   MCHC 32.8 30.0 - 36.0 g/dL   RDW 84.1 32.4 - 40.1 %   Platelets 182.0 150.0 - 400.0 K/uL   Neutrophils Relative % 46.2 43.0 - 77.0 %   Lymphocytes Relative 43.0 12.0 - 46.0 %   Monocytes Relative 8.4 3.0 -  12.0 %   Eosinophils Relative 1.9 0.0 - 5.0 %   Basophils Relative 0.5 0.0 - 3.0 %   Neutro Abs 1.9 1.4 - 7.7 K/uL   Lymphs Abs 1.8 0.7 - 4.0 K/uL   Monocytes Absolute 0.4 0.1 - 1.0 K/uL   Eosinophils Absolute 0.1 0.0 - 0.7 K/uL   Basophils Absolute 0.0 0.0 - 0.1 K/uL  Comprehensive metabolic panel  Result Value Ref Range   Sodium 139 135 - 145 mEq/L   Potassium 4.2 3.5 - 5.1 mEq/L   Chloride 103 96 - 112 mEq/L   CO2 30 19 - 32 mEq/L   Glucose, Bld 97 70 - 99 mg/dL   BUN 14 6 - 23 mg/dL   Creatinine, Ser 0.27 0.40 - 1.20 mg/dL   Total Bilirubin 0.7 0.2 - 1.2 mg/dL   Alkaline Phosphatase 80 39 - 117 U/L   AST 23 0 - 37 U/L   ALT 16 0 - 35 U/L   Total Protein 6.9 6.0 - 8.3 g/dL   Albumin 4.3 3.5 - 5.2 g/dL   GFR 25.36 (L) >64.40 mL/min   Calcium 9.6 8.4 - 10.5 mg/dL     Assessment and Plan:     ICD-10-CM   1. Dizziness  R42 MR Brain W Wo Contrast    2. History of cerebellar stroke  Z86.73 MR Brain W Wo Contrast    3. Tremor  R25.1 MR Brain W Wo Contrast    4. Abnormal neurological exam  R29.90 MR Brain W Wo Contrast    5. Impaired cognition  R41.89 MR Brain W Wo Contrast    6. Orthostasis  I95.1 MR Brain W Wo Contrast    7. Mixed hyperlipidemia  E78.2 MR Brain  W Wo Contrast     Total encounter time: 55 minutes. This includes total time spent on the day of encounter.  Extensive chart review and a patient that I have never met before.  Extensive and difficult history to obtain, additional time spent on secondary historian.  Acute on chronic vestibular symptoms with acute worsening in the last 3 to 4 weeks.  She is a difficult historian, but it sounds as if she is having vertiginous/vertigo symptoms.  She also is having some orthostasis with positional change.  I do not think that this is purely orthostasis.  She does have a history of a remote right sided cerebellar stroke visualized on 2022 MRI.  She has had gradually worsening cognition per the patient's son.  This  seems to have worsened relatively acutely in the last few weeks, but has been subacute as well.  She has difficulty answering questions, and she requires fairly extensive redirection on physical exam.  Question cognitive change from baseline.  She also has decreased functionality over the last few weeks and has had difficulty with some basic activities of daily living.  Abnormal neurological exam.  Abnormal finger-nose.  Extensive redirection to complete examination.  Multiple cardiovascular risk factors including prior stroke, hypertension, poorly controlled hyperlipidemia.  Obtain a stat MRI with and without contrast of the brain to evaluate for acute infarct, neoplasm, or other intracranial pathology that would explain both acute changes and subacute changes.  All recent laboratories are reassuring, and she has a normal B12 and TSH.  Orders placed today for conditions managed today: Orders Placed This Encounter  Procedures   MR Brain W Wo Contrast    Disposition: PCP  [employer] One speech-to-text software was used for transcription in this dictation.  Possible transcriptional errors can occur using Animal nutritionist.   Signed,  Elpidio Galea. Brayley Mackowiak, MD   Outpatient Encounter Medications as of 11/09/2023  Medication Sig   acetaminophen (TYLENOL) 650 MG CR tablet Take 1 tablet (650 mg total) by mouth every 8 (eight) hours as needed for pain.   amLODipine (NORVASC) 2.5 MG tablet Take 2.5 mg by mouth every evening.   famotidine (PEPCID) 20 MG tablet Take 1 tablet (20 mg total) by mouth 2 (two) times daily.   lansoprazole (PREVACID) 30 MG capsule Take 1 capsule (30 mg total) by mouth daily before breakfast. Open Capsule and Mix in yogurt, pudding, applesauce, or liquid.   meloxicam (MOBIC) 7.5 MG tablet Take 7.5 mg by mouth daily.   polyethylene glycol powder (GLYCOLAX/MIRALAX) 17 GM/SCOOP powder Take 17 g by mouth daily.   propranolol (INDERAL) 10 MG tablet Take 10 mg by mouth daily  as needed.   SV MAGNESIUM PO Take 660 mg by mouth daily.   Vitamin D, Ergocalciferol, (DRISDOL) 1.25 MG (50000 UNIT) CAPS capsule Take 1 capsule (50,000 Units total) by mouth every 7 (seven) days.   No facility-administered encounter medications on file as of 11/09/2023.

## 2023-11-09 NOTE — Telephone Encounter (Signed)
Pt spouse called stating the pt is dizzy and it is worse when she gets up. Pt spouse states the pt does not feel good at all. Sent to access nurse

## 2023-11-09 NOTE — Telephone Encounter (Signed)
  Called pt son and got him an appointment with Dr. Patsy Lager today 11/09/2023 at 2:20.

## 2023-11-09 NOTE — Telephone Encounter (Signed)
Noted  

## 2023-11-10 ENCOUNTER — Encounter: Payer: Self-pay | Admitting: Physician Assistant

## 2023-11-10 ENCOUNTER — Other Ambulatory Visit: Payer: Self-pay | Admitting: Family Medicine

## 2023-11-10 ENCOUNTER — Ambulatory Visit: Payer: Medicare HMO | Admitting: Family Medicine

## 2023-11-10 ENCOUNTER — Other Ambulatory Visit: Payer: Self-pay | Admitting: Physician Assistant

## 2023-11-10 ENCOUNTER — Ambulatory Visit
Admission: RE | Admit: 2023-11-10 | Discharge: 2023-11-10 | Disposition: A | Discharge status: Home / Self Care | Payer: Medicare HMO | Source: Ambulatory Visit | Attending: Family Medicine | Admitting: Family Medicine

## 2023-11-10 DIAGNOSIS — I951 Orthostatic hypotension: Secondary | ICD-10-CM

## 2023-11-10 DIAGNOSIS — R251 Tremor, unspecified: Secondary | ICD-10-CM

## 2023-11-10 DIAGNOSIS — R4189 Other symptoms and signs involving cognitive functions and awareness: Secondary | ICD-10-CM

## 2023-11-10 DIAGNOSIS — E782 Mixed hyperlipidemia: Secondary | ICD-10-CM

## 2023-11-10 DIAGNOSIS — K219 Gastro-esophageal reflux disease without esophagitis: Secondary | ICD-10-CM

## 2023-11-10 DIAGNOSIS — Z8673 Personal history of transient ischemic attack (TIA), and cerebral infarction without residual deficits: Secondary | ICD-10-CM

## 2023-11-10 DIAGNOSIS — R42 Dizziness and giddiness: Secondary | ICD-10-CM | POA: Diagnosis not present

## 2023-11-10 DIAGNOSIS — R519 Headache, unspecified: Secondary | ICD-10-CM | POA: Diagnosis not present

## 2023-11-10 DIAGNOSIS — R2990 Unspecified symptoms and signs involving the nervous system: Secondary | ICD-10-CM

## 2023-11-10 MED ORDER — PANTOPRAZOLE SODIUM 40 MG PO TBEC: 40.0000 mg | DELAYED_RELEASE_TABLET | Freq: Every day | ORAL | 3 refills | Status: DC

## 2023-11-10 MED ORDER — GADOPICLENOL 0.5 MMOL/ML IV SOLN
5.0000 mL | Freq: Once | INTRAVENOUS | Status: AC | PRN
  Administered 2023-11-10: 5 mL via INTRAVENOUS

## 2023-11-10 NOTE — Progress Notes (Signed)
Mom couldn't tolerate the Lansoprazole because it seemed like all those little balls hung in her throat so since the change from pantoprazole to Lansoprazole was for ease of swallowing, I restarted the Pantoprazole. So now, if you consent, she is low on the Pantoprazole and needs a refill. Call me back or beat me if you must but please let me know something. Thank you.

## 2023-11-11 ENCOUNTER — Other Ambulatory Visit: Payer: Self-pay | Admitting: Family Medicine

## 2023-11-11 ENCOUNTER — Telehealth: Payer: Self-pay | Admitting: Family Medicine

## 2023-11-11 ENCOUNTER — Other Ambulatory Visit: Payer: Self-pay

## 2023-11-11 DIAGNOSIS — K219 Gastro-esophageal reflux disease without esophagitis: Secondary | ICD-10-CM

## 2023-11-11 MED ORDER — AMLODIPINE BESYLATE 2.5 MG PO TABS: 2.5000 mg | ORAL_TABLET | Freq: Every evening | ORAL | 3 refills | Status: DC

## 2023-11-11 NOTE — Telephone Encounter (Signed)
 Rx request sent to provider

## 2023-11-11 NOTE — Telephone Encounter (Signed)
Patients son called and needing refill for mom  amLODipine (NORVASC) 2.5 MG tablet   Please send to [facility]

## 2023-11-13 ENCOUNTER — Other Ambulatory Visit: Payer: Self-pay

## 2023-11-13 ENCOUNTER — Emergency Department (HOSPITAL_COMMUNITY)
Admission: EM | Admit: 2023-11-13 | Discharge: 2023-11-13 | Disposition: A | Discharge status: Home / Self Care | Payer: Medicare HMO | Attending: Emergency Medicine | Admitting: Emergency Medicine

## 2023-11-13 ENCOUNTER — Emergency Department (HOSPITAL_COMMUNITY): Payer: Medicare HMO

## 2023-11-13 ENCOUNTER — Encounter (HOSPITAL_COMMUNITY): Payer: Self-pay | Admitting: *Deleted

## 2023-11-13 DIAGNOSIS — R42 Dizziness and giddiness: Secondary | ICD-10-CM | POA: Insufficient documentation

## 2023-11-13 DIAGNOSIS — Z79899 Other long term (current) drug therapy: Secondary | ICD-10-CM | POA: Insufficient documentation

## 2023-11-13 DIAGNOSIS — I10 Essential (primary) hypertension: Secondary | ICD-10-CM | POA: Insufficient documentation

## 2023-11-13 DIAGNOSIS — R531 Weakness: Secondary | ICD-10-CM | POA: Diagnosis not present

## 2023-11-13 LAB — COMPREHENSIVE METABOLIC PANEL
ALT: 17 U/L (ref 0–44)
AST: 24 U/L (ref 15–41)
Albumin: 3.8 g/dL (ref 3.5–5.0)
Alkaline Phosphatase: 74 U/L (ref 38–126)
Anion gap: 8 (ref 5–15)
BUN: 13 mg/dL (ref 8–23)
CO2: 27 mmol/L (ref 22–32)
Calcium: 9.3 mg/dL (ref 8.9–10.3)
Chloride: 107 mmol/L (ref 98–111)
Creatinine, Ser: 1.15 mg/dL — ABNORMAL HIGH (ref 0.44–1.00)
GFR, Estimated: 48 mL/min — ABNORMAL LOW (ref >60)
Glucose, Bld: 108 mg/dL — ABNORMAL HIGH (ref 70–99)
Potassium: 4 mmol/L (ref 3.5–5.1)
Sodium: 142 mmol/L (ref 135–145)
Total Bilirubin: 0.6 mg/dL (ref <1.2)
Total Protein: 6.5 g/dL (ref 6.5–8.1)

## 2023-11-13 LAB — CBC WITH DIFFERENTIAL/PLATELET
Abs Immature Granulocytes: 0.02 10*3/uL (ref 0.00–0.07)
Basophils Absolute: 0 10*3/uL (ref 0.0–0.1)
Basophils Relative: 0 %
Eosinophils Absolute: 0 10*3/uL (ref 0.0–0.5)
Eosinophils Relative: 1 %
HCT: 37.4 % (ref 36.0–46.0)
Hemoglobin: 12.2 g/dL (ref 12.0–15.0)
Immature Granulocytes: 0 %
Lymphocytes Relative: 32 %
Lymphs Abs: 1.6 10*3/uL (ref 0.7–4.0)
MCH: 31.3 pg (ref 26.0–34.0)
MCHC: 32.6 g/dL (ref 30.0–36.0)
MCV: 95.9 fL (ref 80.0–100.0)
Monocytes Absolute: 0.4 10*3/uL (ref 0.1–1.0)
Monocytes Relative: 7 %
Neutro Abs: 3 10*3/uL (ref 1.7–7.7)
Neutrophils Relative %: 60 %
Platelets: 177 10*3/uL (ref 150–400)
RBC: 3.9 MIL/uL (ref 3.87–5.11)
RDW: 13.2 % (ref 11.5–15.5)
WBC: 5.1 10*3/uL (ref 4.0–10.5)
nRBC: 0 % (ref 0.0–0.2)

## 2023-11-13 LAB — URINALYSIS, W/ REFLEX TO CULTURE (INFECTION SUSPECTED)
Bacteria, UA: NONE SEEN
Bilirubin Urine: NEGATIVE
Glucose, UA: NEGATIVE mg/dL
Hgb urine dipstick: NEGATIVE
Ketones, ur: NEGATIVE mg/dL
Leukocytes,Ua: NEGATIVE
Nitrite: NEGATIVE
Protein, ur: NEGATIVE mg/dL
Specific Gravity, Urine: 1.009 (ref 1.005–1.030)
pH: 6 (ref 5.0–8.0)

## 2023-11-13 LAB — TROPONIN I (HIGH SENSITIVITY): Troponin I (High Sensitivity): 6 ng/L (ref <18)

## 2023-11-13 MED ORDER — MECLIZINE HCL 12.5 MG PO TABS: 12.5000 mg | ORAL_TABLET | Freq: Three times a day (TID) | ORAL | 0 refills | Status: DC | PRN

## 2023-11-13 NOTE — ED Triage Notes (Signed)
The pt is c/o dizziness since yesterday she reports that her head does not feel right hx of dizziness

## 2023-11-13 NOTE — ED Provider Triage Note (Signed)
Emergency Medicine Provider Triage Evaluation Note  BRAYLEIGH BLATTEL , a 82 y.o. female  was evaluated in triage.  Pt complains of .  Dizziness been going total for two years but has been worsening since 11/2022. difficulty sleeping has been going on for past two years but has been worsening in past four weeks causing her to miss four church services.  Was evaluated by  PCP and had MRI which was normal 4 days ago  Son monitors and provides medicine  Review of Systems  Positive: Dizziness and difficulty sleeping Negative: fevers  Physical Exam  BP 131/65   Pulse 65   Temp 97.9 F (36.6 C)   Resp 20   Ht 5\' 8"  (1.727 m)   Wt 47.7 kg   SpO2 100%   BMI 15.99 kg/m  Gen:   Awake, no distress   Resp:  Normal effort  MSK:   Moves extremities without difficulty  Other:    Medical Decision Making  Medically screening exam initiated at 4:00 PM.  Appropriate orders placed.  [name] was informed that the remainder of the evaluation will be completed by another provider, this initial triage assessment does not replace that evaluation, and the importance of remaining in the ED until their evaluation is complete.  Labs ordered   Judithann Sheen, [place] 11/13/23 1609

## 2023-11-13 NOTE — ED Provider Notes (Signed)
Patient here with dizziness.  Supervised resident visit.  History of the same.  She is neurologically intact.  Lab work unremarkable per my review and interpretation.  EKG shows sinus rhythm.  Troponin negative.  She is not having any chest pain.  She had an MRI few days ago that was negative for stroke.  This is been a chronic issue for some time.  She is followed with ENT and cardiology and even neurology in the past.  Things right now are reassuring.  She has a history of anxiety and headaches and essential tremor.  I do recommend that stay talk with primary care doctor about maybe doing more cardiac monitor and maybe get another neuro evaluation to discuss may be movement disorder/Parkinson's.  Overall patient discharged in good condition.  Understands return precautions.  Will give meclizine to take as needed to see if that helps instead of Dramamine.  This chart was dictated using voice recognition software.  Despite best efforts to proofread,  errors can occur which can change the documentation meaning.    Suzanne Norfolk, DO 11/13/23 2144

## 2023-11-13 NOTE — Discharge Instructions (Signed)
Take meclizine as needed for dizziness.  I strongly encourage you as we discussed a follow-up with your primary care doctor to discuss a neurology evaluation and a cardiology evaluation outpatient.

## 2023-11-13 NOTE — ED Provider Notes (Cosign Needed Addendum)
[facility] EMERGENCY DEPARTMENT AT [facility] Provider Note   CSN: [id number] Arrival date & time: 11/13/23  1508     History  Chief Complaint  Patient presents with   Dizziness    Suzanne Leon is a 82 y.o. female.  82 year old female with a past medical history of HTN, HLD, essential tremor, prior CVA presents here for concerns of dizziness.  Been an ongoing process for patient, which has been occurring intermittently over the last several years.  Patient had recurrence of her symptoms around the 27th of November.  Patient states that her dizziness is provoked with head turning.  She denies any chest pain or shortness of breath.  The patient's son states that she has not had any infectious symptoms.  She has been seeing her PCP for this, who ordered an MRI.  MRI was performed on 12/5.  No new findings but does demonstrate her previously known CVA.  Has seen neurology for this in the past.  She has been taking Dramamine as needed for symptom management.  The history is provided by the patient and a caregiver.       Home Medications Prior to Admission medications   Medication Sig Start Date End Date Taking? Authorizing Provider  meclizine (ANTIVERT) 12.5 MG tablet Take 1 tablet (12.5 mg total) by mouth 3 (three) times daily as needed for dizziness. 11/13/23  Yes Curatolo, Adam, DO  acetaminophen (TYLENOL) 650 MG CR tablet Take 1 tablet (650 mg total) by mouth every 8 (eight) hours as needed for pain. 10/02/23   Dana Allan, MD  amLODipine (NORVASC) 2.5 MG tablet Take 1 tablet (2.5 mg total) by mouth every evening. 11/11/23   Dana Allan, MD  famotidine (PEPCID) 20 MG tablet Take 1 tablet (20 mg total) by mouth 2 (two) times daily. 09/30/23   Dana Allan, MD  meloxicam (MOBIC) 7.5 MG tablet Take 7.5 mg by mouth daily.    [provider]  pantoprazole (PROTONIX) 40 MG tablet Take 1 tablet (40 mg total) by mouth daily. 11/10/23 11/04/24  Celso Amy, PA-C   polyethylene glycol powder (GLYCOLAX/MIRALAX) 17 GM/SCOOP powder Take 17 g by mouth daily. 10/10/23   Celso Amy, PA-C  propranolol (INDERAL) 10 MG tablet Take 10 mg by mouth daily as needed.    [provider]  SV MAGNESIUM PO Take 660 mg by mouth daily.    [provider]  Vitamin D, Ergocalciferol, (DRISDOL) 1.25 MG (50000 UNIT) CAPS capsule Take 1 capsule (50,000 Units total) by mouth every 7 (seven) days. 09/30/23   Dana Allan, MD      Allergies    Crestor [rosuvastatin], Fosamax [alendronate], Mysoline [primidone], Prolia [denosumab], Prozac [fluoxetine], Sinemet [carbidopa-levodopa], Vitamin d analogs, and Zocor [simvastatin]    Review of Systems   As noted in HPI  Physical Exam Updated Vital Signs BP (!) 147/60   Pulse (!) 59   Temp 98.1 F (36.7 C)   Resp 20   Ht 5\' 8"  (1.727 m)   Wt 47.7 kg   SpO2 100%   BMI 15.99 kg/m  Physical Exam Vitals reviewed.  Constitutional:      General: She is not in acute distress.    Appearance: Normal appearance. She is not ill-appearing, toxic-appearing or diaphoretic.  HENT:     Head: Normocephalic and atraumatic.     Nose: Nose normal.     Mouth/Throat:     Mouth: Mucous membranes are moist.  Eyes:     General: No scleral  icterus.    Extraocular Movements: Extraocular movements intact.     Conjunctiva/sclera: Conjunctivae normal.     Pupils: Pupils are equal, round, and reactive to light.  Cardiovascular:     Rate and Rhythm: Normal rate and regular rhythm.     Pulses: Normal pulses.          Radial pulses are 2+ on the right side and 2+ on the left side.       Dorsalis pedis pulses are 2+ on the right side and 2+ on the left side.     Heart sounds: Normal heart sounds. No murmur heard.    No friction rub. No gallop.  Pulmonary:     Effort: Pulmonary effort is normal. No respiratory distress.     Breath sounds: Normal breath sounds. No wheezing, rhonchi or rales.  Abdominal:     General: There is no  distension.     Palpations: Abdomen is soft.     Tenderness: There is no abdominal tenderness. There is no guarding or rebound.  Musculoskeletal:     Right lower leg: No edema.     Left lower leg: No edema.  Skin:    General: Skin is warm and dry.  Neurological:     Mental Status: She is alert.     Cranial Nerves: Cranial nerves 2-12 are intact. No cranial nerve deficit, dysarthria or facial asymmetry.     Sensory: Sensation is intact. No sensory deficit.     Motor: Tremor present. No weakness or pronator drift.     Coordination: Finger-Nose-Finger Test and Heel to [employer] normal.     ED Results / Procedures / Treatments   Labs (all labs ordered are listed, but only abnormal results are displayed) Labs Reviewed  COMPREHENSIVE METABOLIC PANEL - Abnormal; Notable for the following components:      Result Value   Glucose, Bld 108 (*)    Creatinine, Ser 1.15 (*)    GFR, Estimated 48 (*)    All other components within normal limits  CBC WITH DIFFERENTIAL/PLATELET  URINALYSIS, W/ REFLEX TO CULTURE (INFECTION SUSPECTED)  TROPONIN I (HIGH SENSITIVITY)    EKG EKG Interpretation Date/Time:  Sunday November 13 2023 19:40:30 EST Ventricular Rate:  58 PR Interval:  131 QRS Duration:  98 QT Interval:  474 QTC Calculation: 466 R Axis:   -55  Text Interpretation: Sinus arrhythmia Confirmed by Virgina Norfolk [phone]) on 11/13/2023 7:51:25 PM  Radiology DG Chest Portable 1 View  Result Date: 11/13/2023 CLINICAL DATA:  Weakness.  Dizziness. EXAM: PORTABLE CHEST 1 VIEW COMPARISON:  12/05/2020.  CT, 01/27/2023. FINDINGS: Mild enlargement of the cardiac silhouette, stable. No mediastinal or hilar masses. Lungs hyperexpanded, but clear. No pleural effusion or pneumothorax. Skeletal structures are grossly intact. IMPRESSION: 1. No acute cardiopulmonary disease. Electronically Signed   By: Amie Portland M.D.   On: 11/13/2023 19:41    Procedures Procedures    Medications Ordered in  ED Medications - No data to display  ED Course/ Medical Decision Making/ A&P Clinical Course as of 11/13/23 2342  Sun Nov 13, 2023  2105 DG Chest Portable 1 View Independently interpreted this image.  No acute findings.  Patient does appear to have some cardiomegaly versus rotational artifact. [JR]  2105 EKG 12-Lead Sinus rhythm.  Rate of 58.  Normal intervals.  Left axis deviation.  No ST segment changes.  Nonspecific T wave abnormalities in 3, aVF, V3.  Compared to prior ECG, no significant changes noted. [JR]  Clinical Course User Index [JR] Rolla Flatten, MD                                 Medical Decision Making Amount and/or Complexity of Data Reviewed Independent Historian: caregiver External Data Reviewed: notes. Labs: ordered. Radiology: ordered and independent interpretation performed. Decision-making details documented in ED Course. ECG/medicine tests: ordered and independent interpretation performed. Decision-making details documented in ED Course.  Risk Prescription drug management. Decision regarding hospitalization.   82 year old female presents here for persistent dizziness over the last several weeks.  She has been evaluated by her PCP and is already undergone MRI on 12/5 that did not demonstrate any evidence of acute ischemia.  She presents here for persistent symptoms.  Vitals reassuring on presentation.  On exam, she is nontoxic-appearing.  No focal neurologic deficits noted on exam.  However, patient does have diffuse tremoring of her head and upper extremities throughout my evaluation.  Other notable findings on exam.  On chart review, it does not appear that an infectious or cardiac evaluation has been done to assess these potential etiologies of her dizziness.  Will obtain chest x-ray to evaluate for pneumonia and UA to evaluate for UTI.  Will get screening labs with CMP, CBC.  Will also get screening troponin.  I independently interpreted the patient's chest  x-ray, which is not consistent with pneumonia.  I independently interpreted her laboratory workup, which is notable only for creatinine that is at or slightly above her baseline at 1.15.  Otherwise, remainder of laboratory workup unremarkable. I  independently interpreted the patient's ECG.  When compared to prior, no ischemic findings noted.  Workup here overall reassuring and unrevealing.  Patient would likely benefit from further evaluation by neurologist.  She has not seen a neurologist for the last few years.  Recommended to the patient and her son to follow-up with her PCP, who can place referral to neurology.  As the patient would prefer to see a new neurologist rather than the provider she has seen in the past.  I considered hospital admission.  However, do not feel that patient meets criteria for admission at this time.  Will continue outpatient management with PCP follow-up and referrals to neurology and potentially cardiology as well.  Patient will be discharged with a prescription for meclizine to take as needed for symptom treatment. This plan was discussed with the patient and her son, who were in agreement expressed understanding.  Return precautions were discussed with the patient and her son.  Patient discharged in stable condition.  Patient's presentation is most consistent with acute complicated illness / injury requiring diagnostic workup.         Final Clinical Impression(s) / ED Diagnoses Final diagnoses:  Dizziness    Rx / DC Orders ED Discharge Orders          Ordered    meclizine (ANTIVERT) 12.5 MG tablet  3 times daily PRN        11/13/23 2142              Rolla Flatten, MD 11/13/23 Ouida Sills    Rolla Flatten, MD 11/13/23 2344    Virgina Norfolk, DO 11/14/23 0009

## 2023-11-13 NOTE — ED Notes (Signed)
Son told me of episodes of diarrhea that were yellow in nature a couple of weeks ago. He cut out gluten from her diet and it resolved. A couple of days ago she ate a biscuit and the diarrhea came back. Has stopped since she went back to the no gluten diet. Son is wondering if this has something to do with the dizziness.

## 2023-11-14 ENCOUNTER — Telehealth: Payer: Self-pay | Admitting: Family Medicine

## 2023-11-14 ENCOUNTER — Encounter: Payer: Self-pay | Admitting: Family Medicine

## 2023-11-14 ENCOUNTER — Other Ambulatory Visit: Payer: Self-pay

## 2023-11-14 DIAGNOSIS — R251 Tremor, unspecified: Secondary | ICD-10-CM

## 2023-11-14 DIAGNOSIS — R42 Dizziness and giddiness: Secondary | ICD-10-CM

## 2023-11-14 DIAGNOSIS — R930 Abnormal findings on diagnostic imaging of skull and head, not elsewhere classified: Secondary | ICD-10-CM

## 2023-11-14 NOTE — Telephone Encounter (Signed)
Pt's son, [name], called in stating the pt saw Dr. Patsy Lager on 11/09/23 for dizziness. [name] states Dr. Patsy Lager advised pt she'd need a neurology referral. [name] states the pt was seen at the ER last night, 12/8, & was told the same thing regarding the referral. [name] asked if a referral could be submitted for pt? [name] states the pt's preferred area is [place]. Call back #  [phone]

## 2023-11-15 ENCOUNTER — Other Ambulatory Visit: Payer: Self-pay

## 2023-11-15 ENCOUNTER — Telehealth: Payer: Self-pay | Admitting: Physician Assistant

## 2023-11-15 DIAGNOSIS — Z87898 Personal history of other specified conditions: Secondary | ICD-10-CM

## 2023-11-15 DIAGNOSIS — R42 Dizziness and giddiness: Secondary | ICD-10-CM

## 2023-11-15 NOTE — Telephone Encounter (Signed)
Referral placed.

## 2023-11-15 NOTE — Telephone Encounter (Signed)
Patient son Suzanne Leon) called in requesting his mother refill for pantoprazole 40 mg once daily, #90 with 3 refills. (For 1 year). They never received it in the mail and she is running out of medication. He said online it say processing and he want to just send it [facility] on [id number], [place], [place] [address] there number is [phone]. The son said that if there is a problem to give him a call back on [phone].

## 2023-11-16 ENCOUNTER — Encounter: Payer: Self-pay | Admitting: Family Medicine

## 2023-11-16 ENCOUNTER — Ambulatory Visit (INDEPENDENT_AMBULATORY_CARE_PROVIDER_SITE_OTHER): Payer: Medicare HMO | Admitting: Family Medicine

## 2023-11-16 ENCOUNTER — Ambulatory Visit: Payer: Medicare HMO | Attending: Family Medicine

## 2023-11-16 ENCOUNTER — Telehealth: Payer: Self-pay | Admitting: Cardiology

## 2023-11-16 ENCOUNTER — Telehealth: Payer: Self-pay

## 2023-11-16 VITALS — BP 114/70 | HR 53 | Temp 98.0°F | Resp 18 | Ht 68.0 in | Wt 105.0 lb

## 2023-11-16 DIAGNOSIS — R09A2 Foreign body sensation, throat: Secondary | ICD-10-CM | POA: Diagnosis not present

## 2023-11-16 DIAGNOSIS — G250 Essential tremor: Secondary | ICD-10-CM

## 2023-11-16 DIAGNOSIS — Z95818 Presence of other cardiac implants and grafts: Secondary | ICD-10-CM

## 2023-11-16 DIAGNOSIS — R42 Dizziness and giddiness: Secondary | ICD-10-CM | POA: Diagnosis not present

## 2023-11-16 DIAGNOSIS — K219 Gastro-esophageal reflux disease without esophagitis: Secondary | ICD-10-CM

## 2023-11-16 MED ORDER — PANTOPRAZOLE SODIUM 40 MG PO TBEC: 40.0000 mg | DELAYED_RELEASE_TABLET | Freq: Every day | ORAL | 2 refills | Status: DC

## 2023-11-16 NOTE — Telephone Encounter (Signed)
I am fine also

## 2023-11-16 NOTE — Progress Notes (Signed)
SUBJECTIVE:   Chief Complaint  Patient presents with   Hospitalization Follow-up   HPI Presents to clinic for follow up hospital visit  Discussed the use of AI scribe software for clinical note transcription with the patient, who gave verbal consent to proceed.  History of Present Illness The patient, with a history of dizziness and throat discomfort, presented for a follow-up after a recent hospital visit. The patient's spouse reported that the patient had a dizzy spell one morning, which persisted throughout the day, prompting a call to the nurse line and subsequent hospital visit. At the hospital, the patient underwent extensive testing, including blood tests, chest x-rays, and urine tests, but no definitive cause for the dizziness was identified. The patient was prescribed Antivert for the dizziness, but it was unclear if this medication provided any relief.  The patient also underwent an MRI at [facility], which reportedly showed no significant changes from a previous scan. 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 The patient's son reported that she does not have appointments scheduled with a cardiologist and a neurologist for further evaluation.  The patient has also been experiencing discomfort in the throat, which has led to difficulty swallowing and occasional choking. The patient has an upcoming appointment with a gastroenterologist to investigate this issue. The patient has been compliant with prescribed medications, including pantoprazole for heartburn.  The patient's spouse noted some changes in the patient's gait, describing it as slower and less coordinated than before. On one occasion, the patient's walk was described as a side-to-side motion. However, this was not a consistent observation. The patient's blood pressure readings have been generally within normal limits.  In summary, the patient presented with ongoing dizziness and throat discomfort,  both of which are currently unexplained despite extensive testing. The patient is scheduled for further evaluation with cardiology, neurology, and gastroenterology. The patient's gait changes and blood pressure readings will also need to be monitored closely.    PERTINENT PMH / PSH: As above  OBJECTIVE:  BP 114/70   Pulse (!) 53   Temp 98 F (36.7 C)   Resp 18   Ht 5\' 8"  (1.727 m)   Wt 105 lb (47.6 kg)   SpO2 100%   BMI 15.97 kg/m    Physical Exam Vitals reviewed.  Constitutional:      General: She is not in acute distress.    Appearance: Normal appearance. She is normal weight. She is not ill-appearing, toxic-appearing or diaphoretic.  Eyes:     General:        Right eye: No discharge.        Left eye: No discharge.     Conjunctiva/sclera: Conjunctivae normal.  Cardiovascular:     Rate and Rhythm: Normal rate and regular rhythm.     Heart sounds: Normal heart sounds.  Pulmonary:     Effort: Pulmonary effort is normal.     Breath sounds: Normal breath sounds.  Abdominal:     General: Bowel sounds are normal.  Musculoskeletal:        General: No deformity. Normal range of motion.     Right lower leg: No edema.     Left lower leg: No edema.  Skin:    General: Skin is warm and dry.  Neurological:     General: No focal deficit present.     Mental Status: She is alert and oriented to person, place, and time. Mental status is at baseline.     Coordination:  Coordination normal.     Gait: Gait normal.  Psychiatric:        Mood and Affect: Mood normal.        Behavior: Behavior normal.        Thought Content: Thought content normal.        Judgment: Judgment normal.        11/16/2023   11:08 AM 09/30/2023   11:26 AM 08/22/2023    3:01 PM  Depression screen PHQ 2/9  Decreased Interest 0 0 0  Down, Depressed, Hopeless 0 0 0  PHQ - 2 Score 0 0 0  Altered sleeping 3 1 3   Tired, decreased energy 1 0 1  Change in appetite 0 0 0  Feeling bad or failure about yourself   0 0 0  Trouble concentrating 0 0 0  Moving slowly or fidgety/restless 0 0 0  Suicidal thoughts 0 0 0  PHQ-9 Score 4 1 4   Difficult doing work/chores Somewhat difficult Not difficult at all Somewhat difficult      11/16/2023   11:08 AM 09/30/2023   11:26 AM 08/22/2023    3:01 PM  GAD 7 : Generalized Anxiety Score  Nervous, Anxious, on Edge 0 0 1  Control/stop worrying 0 0 0  Worry too much - different things 0 0 0  Trouble relaxing 0 0 0  Restless 0 0 0  Easily annoyed or irritable 0 0 1  Afraid - awful might happen 0 0 0  Total GAD 7 Score 0 0 2  Anxiety Difficulty Not difficult at all Not difficult at all Not difficult at all    ASSESSMENT/PLAN:  Dizziness Assessment & Plan: Recent hospital visit for dizziness. Extensive workup including blood tests, chest x-ray, urine tests, and MRI did not reveal a clear cause. Antivert was prescribed but did not seem to help. 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 -Refer to physical therapy for Epley's maneuver training. -Order Zio patch for cardiac monitoring for two weeks to rule out cardiac causes of dizziness. -Follow up with Cardiology -Refer to neurology for further evaluation.  Orders: -     Ambulatory referral to Physical Therapy -     LONG TERM MONITOR (3-14 DAYS); Future -     Ambulatory referral to Neurology  Essential tremor Assessment & Plan: Patient has a tremor and there are reports of possible changes in gait. -Refer to neurology for further evaluation, seen Dr Allena Katz in past.  May need neuromuscular evaluation. -Continue Inderal 10 mg as prescribed.  Orders: -     Ambulatory referral to Neurology  Gastroesophageal reflux disease, unspecified whether esophagitis present Assessment & Plan: Continue Protonix 40 mg daily    Globus sensation Assessment & Plan: Patient reports discomfort in the throat and difficulty swallowing. Currently on Protonix. -Scheduled appointment with GI on  January 09, 2023. Encourage patient to call for earlier appointment if there are cancellations.   Loop recorder: [employer]ron[employer] 08/13/2021 for synocpe Assessment & Plan: Patient has a history of a loop recorder implantation, which did not reveal any arrhythmias. Patient does not want to see Dr. Jacinto Halim. -Refer to a different cardiologist for further evaluation and management.    PDMP reviewed  Return if symptoms worsen or fail to improve, for PCP.  Dana Allan, MD

## 2023-11-16 NOTE — Telephone Encounter (Signed)
Pt requesting provider switch to Dr. [place]. Please advise

## 2023-11-16 NOTE — Patient Instructions (Signed)
It was a pleasure meeting you today. Thank you for allowing me to take part in your health care.  Our goals for today as we discussed include:  Heart monitor has been ordered.  Referral sent to physical therapy for dizziness  Will send referral to cardiology and neurology   Follow up with GI for throat issue   This is a list of the screening recommended for you and due dates:  Health Maintenance  Topic Date Due   COVID-19 Vaccine (1 - 2023-24 season) Never done   Zoster (Shingles) Vaccine (1 of 2) 01/02/2024*   Flu Shot  03/05/2024*   Pneumonia Vaccine (1 of 1 - PCV) 10/01/2024*   DEXA scan (bone density measurement)  10/01/2024*   Medicare Annual Wellness Visit  01/25/2024   DTaP/Tdap/Td vaccine (2 - Td or Tdap) 07/24/2025   HPV Vaccine  Aged Out  *Topic was postponed. The date shown is not the original due date.    Follow up as needed  If you have any questions or concerns, please do not hesitate to call the office at [phone].  I look forward to our next visit and until then take care and stay safe.  Regards,   Dana Allan, MD   [facility]

## 2023-11-16 NOTE — Telephone Encounter (Signed)
Pantoprazole sent to [facility]. Patient's son [name] was notified.

## 2023-11-20 ENCOUNTER — Encounter: Payer: Self-pay | Admitting: Family Medicine

## 2023-11-20 DIAGNOSIS — R42 Dizziness and giddiness: Secondary | ICD-10-CM | POA: Insufficient documentation

## 2023-11-20 NOTE — Assessment & Plan Note (Signed)
Patient reports discomfort in the throat and difficulty swallowing. Currently on Protonix. -Scheduled appointment with GI on January 09, 2023. Encourage patient to call for earlier appointment if there are cancellations.

## 2023-11-20 NOTE — Assessment & Plan Note (Signed)
Patient has a history of a loop recorder implantation, which did not reveal any arrhythmias. Patient does not want to see Dr. Jacinto Halim. -Refer to a different cardiologist for further evaluation and management.

## 2023-11-20 NOTE — Assessment & Plan Note (Addendum)
Continue Protonix 40 mg daily

## 2023-11-20 NOTE — Assessment & Plan Note (Addendum)
Recent hospital visit for dizziness. Extensive workup including blood tests, chest x-ray, urine tests, and MRI did not reveal a clear cause. Antivert was prescribed but did not seem to help. 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 -Refer to physical therapy for Epley's maneuver training. -Order Zio patch for cardiac monitoring for two weeks to rule out cardiac causes of dizziness. -Follow up with Cardiology -Refer to neurology for further evaluation.

## 2023-11-20 NOTE — Assessment & Plan Note (Signed)
Patient has a tremor and there are reports of possible changes in gait. -Refer to neurology for further evaluation, seen Dr Allena Katz in past.  May need neuromuscular evaluation. -Continue Inderal 10 mg as prescribed.

## 2023-11-21 ENCOUNTER — Telehealth: Payer: Self-pay | Admitting: Physician Assistant

## 2023-11-21 DIAGNOSIS — R42 Dizziness and giddiness: Secondary | ICD-10-CM | POA: Diagnosis not present

## 2023-11-21 NOTE — Telephone Encounter (Signed)
Called pt and he stated that he wanted a referral another doctor. Pt son also stated that he took his mom off the famotidine (PEPCID) 20 MG tablet  and she is doing better since he has stopped it.

## 2023-11-21 NOTE — Telephone Encounter (Signed)
The patient called in for his mother because she is having a hard swallowing and she wanted to know if she can come sooner.

## 2023-11-25 ENCOUNTER — Ambulatory Visit: Payer: Medicare HMO | Admitting: Cardiology

## 2023-12-04 ENCOUNTER — Other Ambulatory Visit: Payer: Self-pay | Admitting: Family Medicine

## 2023-12-04 DIAGNOSIS — R42 Dizziness and giddiness: Secondary | ICD-10-CM

## 2023-12-04 DIAGNOSIS — R55 Syncope and collapse: Secondary | ICD-10-CM

## 2023-12-06 ENCOUNTER — Other Ambulatory Visit: Payer: Self-pay

## 2023-12-08 DIAGNOSIS — Z87891 Personal history of nicotine dependence: Secondary | ICD-10-CM | POA: Diagnosis not present

## 2023-12-08 DIAGNOSIS — I639 Cerebral infarction, unspecified: Secondary | ICD-10-CM | POA: Diagnosis not present

## 2023-12-08 DIAGNOSIS — K219 Gastro-esophageal reflux disease without esophagitis: Secondary | ICD-10-CM | POA: Diagnosis not present

## 2023-12-08 DIAGNOSIS — R251 Tremor, unspecified: Secondary | ICD-10-CM | POA: Diagnosis not present

## 2023-12-08 DIAGNOSIS — R42 Dizziness and giddiness: Secondary | ICD-10-CM | POA: Diagnosis not present

## 2023-12-08 NOTE — Progress Notes (Signed)
 Ellouise Console, PA-C [address]  [address]  [place], [place] [address]  Main: [phone]  Fax: [phone]   Primary Care Physician: Hope Merle, MD  Primary Gastroenterologist:  Ellouise Console, PA-C / Dr. Ruel Kung    CC: F/U Chronic GERD, chronic dysphagia, constipation HPI: Suzanne Leon is a 83 y.o. female  returns for 2 month follow-up of chronic dysphagia and GERD.  She last saw Dr. Kung in 2022 to evaluate dysphagia.  She is here today with her son who helps with her care.  She has a history of GERD but has had issues with solid food dysphagia for many years since at least 2013.  She saw neurologist Dr. Tobie in 2020 who ruled out Parkinson's disease.  Has also been evaluated at [facility] for dysphagia many years ago.   -2014: Esophageal manometry study: increased LES pressure.   -2013: EGD:  No clear stricture, but she was empirically dilated to 48 French. -01/2023:  CT chest normal / no abnormality of the esophagus.    -EGD 06/2021 was normal.  She underwent empiric esophageal dilation to 18 mm.  Esophageal biopsies negative for eosinophilic esophagitis.  -02/2023 Barium swallow with tablet: moderate GERD, mild esophageal dysmotility characteristic of chronic reflux.  No hiatal hernia.  No evidence of esophageal mass or stricture.   -07/2023: Neck CT was normal.   2 months ago she was switched from pantoprazole  to lansoprazole  1 capsule once daily, open capsule and mix in a drink, yogurt, or applesauce.  She has difficulty swallowing pills.  She was also started on MiraLAX  for constipation.  Apparently, patient had difficulty talking lansoprazole , and she requested to switch back to pantoprazole  40 Mg daily.    Current symptom: She continues to have difficulty swallowing.  Continue compliant with taking pantoprazole  40 Mg 1 tablet once daily every day consistently.  She does not feel chest pain or heartburn.  She still states that she chokes when she drinks liquids.   She has to eat food very slowly.  Feels like it sticks in her left neck.  She has difficulty swallowing liquids.  She eats small portions 2 meals per day.  Weight is 105, the same from 2 months ago.  Weight is down 13 pounds in the past year.  She is afraid to eat.  She has remote history of stroke which affected her cerebellum.  She has some difficulty with a few words / speech.  Current Outpatient Medications  Medication Sig Dispense Refill   acetaminophen  (TYLENOL ) 650 MG CR tablet Take 1 tablet (650 mg total) by mouth every 8 (eight) hours as needed for pain.     amLODipine  (NORVASC ) 2.5 MG tablet Take 1 tablet (2.5 mg total) by mouth every evening. 90 tablet 3   meclizine  (ANTIVERT ) 12.5 MG tablet Take 1 tablet (12.5 mg total) by mouth 3 (three) times daily as needed for dizziness. 30 tablet 0   meloxicam  (MOBIC ) 7.5 MG tablet Take 7.5 mg by mouth daily.     pantoprazole  (PROTONIX ) 40 MG tablet Take 1 tablet (40 mg total) by mouth daily. 30 tablet 2   polyethylene glycol powder (GLYCOLAX /MIRALAX ) 17 GM/SCOOP powder Take 17 g by mouth daily.     propranolol  (INDERAL ) 10 MG tablet Take 10 mg by mouth daily as needed.     SV MAGNESIUM PO Take 660 mg by mouth daily.     Vitamin D , Ergocalciferol , (DRISDOL ) 1.25 MG (50000 UNIT) CAPS capsule Take 1 capsule (50,000  Units total) by mouth every 7 (seven) days. 12 capsule 1   No current facility-administered medications for this visit.    Allergies as of 12/09/2023 - Review Complete 12/09/2023  Allergen Reaction Noted   Crestor [rosuvastatin]  02/22/2023   Fosamax [alendronate]  02/22/2023   Mysoline [primidone]  02/22/2023   Prolia  [denosumab ]  02/22/2023   Prozac [fluoxetine]  02/22/2023   Sinemet [carbidopa-levodopa]  02/22/2023   Vitamin d  analogs  02/22/2023   Zocor [simvastatin]  02/22/2023    Past Medical History:  Diagnosis Date   Alopecia totalis 10/27/2016   Anxiety    Arthritis    Chronic headaches    Depression    Dizziness     Educated about COVID-19 virus infection 04/02/2019   Essential tremor    HEAD. takes propranolol  as needed.   Fatigue    GERD (gastroesophageal reflux disease)    HLD (hyperlipidemia)    Internal hemorrhoids    Low back pain    Osteopenia    Osteoporosis    Other specified disorders of nose and nasal sinuses 07/22/2017   Pharyngeal dysphagia 10/06/2023   Skin cancer    skin cancer   Weight loss 10/19/2018    Past Surgical History:  Procedure Laterality Date   ABDOMINAL HYSTERECTOMY     APPENDECTOMY     COLONOSCOPY     ESOPHAGEAL MANOMETRY  12/18/2012   Procedure: ESOPHAGEAL MANOMETRY (EM);  Surgeon: Princella CHRISTELLA Nida, MD;  Location: [facility] ENDOSCOPY;  Service: Endoscopy;  Laterality: N/A;   ESOPHAGOGASTRODUODENOSCOPY (EGD) WITH PROPOFOL  N/A 06/15/2021   Procedure: ESOPHAGOGASTRODUODENOSCOPY (EGD) WITH PROPOFOL ;  Surgeon: Therisa Bi, MD;  [facility] END[facility]COPY;  Service: Gastroenterology;  Laterality: N/A;   EYE SURGERY     LAMINECTOMY     Loop explant     Explanted Biomonitor III loop recorder 10/12/2022   LOOP RECORDER IMPLANT  08/13/2021   Biotronik Biomonitor III   LUMBAR SPINE SURGERY     2010   remote T&A     TONSILLECTOMY      Review of Systems:    All systems reviewed and negative except where noted in HPI.   Physical Examination:   BP 130/84   Pulse 70   Temp 97.6 F (36.4 C)   Ht 5' 8 (1.727 m)   Wt 105 lb 12.8 oz (48 kg)   BMI 16.09 kg/m   General: Well-nourished, well-developed in no acute distress.  Neuro: Alert and oriented x 3.  Grossly intact.  Psych: Alert and cooperative, normal mood and affect.   Imaging Studies: DG Chest Portable 1 View Result Date: 11/13/2023 CLINICAL DATA:  Weakness.  Dizziness. EXAM: PORTABLE CHEST 1 VIEW COMPARISON:  12/05/2020.  CT, 01/27/2023. FINDINGS: Mild enlargement of the cardiac silhouette, stable. No mediastinal or hilar masses. Lungs hyperexpanded, but clear. No pleural effusion or pneumothorax. Skeletal  structures are grossly intact. IMPRESSION: 1. No acute cardiopulmonary disease. Electronically Signed   By: Alm Parkins M.D.   On: 11/13/2023 19:41   MR Brain W Wo Contrast Result Date: 11/10/2023 CLINICAL DATA:  Headache, dizziness, tremor, stroke suspected EXAM: MRI HEAD WITHOUT AND WITH CONTRAST TECHNIQUE: Multiplanar, multiecho pulse sequences of the brain and surrounding structures were obtained without and with intravenous contrast. CONTRAST:  5 mL Vueway  COMPARISON:  05/20/2021 MRI head FINDINGS: Brain: No restricted diffusion to suggest acute or subacute infarct. No abnormal parenchymal or meningeal enhancement. No acute hemorrhage, mass, mass effect, or midline shift. No hydrocephalus or extra-axial collection. Pituitary and craniocervical junction Age-related  cerebral atrophy with more advanced atrophy in the left temporal and occipital lobes, with dilatation of the left temporal and occipital horns. Scattered and confluent T2 hyperintense signal in the periventricular white matter, likely the sequela of moderate chronic small vessel ischemic disease. Remote right cerebellar infarct. Vascular: Normal arterial flow voids. Normal arterial and venous enhancement. Skull and upper cervical spine: Normal marrow signal. Sinuses/Orbits: Mild mucosal thickening in the ethmoid air cells. No acute finding in the orbits. Status post bilateral lens replacements. Other: Fluid in left mastoid air cells. IMPRESSION: No acute intracranial process. No evidence of acute or subacute infarct. Electronically Signed   By: Donald Campion M.D.   On: 11/10/2023 18:05    Assessment and Plan:   SARAHY CREEDON is a 83 y.o. y/o female returns for follow-up of:  1.  Chronic oropharyngeal dysphagia (since 2013)  -EGD in 2022 Normal.  -Regular Barium Swallow with Tab 08/2023: Moderate GERD; otherwise Normal.  -Normal Neck and Chest CT Scans in 2024.  -History of Stroke effecting Speech (mild effect)   **I am Ordering  Modified Barium Swallow Test with Speech Pathology Eval.   2.  Chronic GERD  Continue pantoprazole  40mg  1 tablet once daily.  Recommend Lifestyle Modifications to prevent Acid Reflux.  Rec. Avoid coffee, sodas, peppermint, garlic, onions, alcohol , citrus fruits, chocolate, tomatoes, fatty and spicey foods.  Avoid eating 2-3 hours before bedtime.    3.  Low Weight  Discussed eating 3 meals per day plus snacks.  Discussed increasing calories in diet.  4.  History of stroke affecting cerebellum  She has follow-up appointment with neurologist at [facility] 02/2024.  Ellouise Console, PA-C  Follow up in 6 months.

## 2023-12-09 ENCOUNTER — Other Ambulatory Visit: Payer: Self-pay | Admitting: Physician Assistant

## 2023-12-09 ENCOUNTER — Encounter: Payer: Self-pay | Admitting: Physician Assistant

## 2023-12-09 ENCOUNTER — Ambulatory Visit: Payer: Medicare HMO | Admitting: Physician Assistant

## 2023-12-09 VITALS — BP 130/84 | HR 70 | Temp 97.6°F | Ht 68.0 in | Wt 105.8 lb

## 2023-12-09 DIAGNOSIS — R636 Underweight: Secondary | ICD-10-CM | POA: Diagnosis not present

## 2023-12-09 DIAGNOSIS — Z681 Body mass index (BMI) 19 or less, adult: Secondary | ICD-10-CM

## 2023-12-09 DIAGNOSIS — K219 Gastro-esophageal reflux disease without esophagitis: Secondary | ICD-10-CM | POA: Diagnosis not present

## 2023-12-09 DIAGNOSIS — R1312 Dysphagia, oropharyngeal phase: Secondary | ICD-10-CM

## 2023-12-09 DIAGNOSIS — Z8673 Personal history of transient ischemic attack (TIA), and cerebral infarction without residual deficits: Secondary | ICD-10-CM | POA: Diagnosis not present

## 2023-12-09 DIAGNOSIS — R42 Dizziness and giddiness: Secondary | ICD-10-CM | POA: Diagnosis not present

## 2023-12-09 NOTE — Patient Instructions (Addendum)
 We will be in touch to schedule the Modified Barium Swallow test.   Continue pantoprazole  40mg  1 tablet once daily.   Recommend Lifestyle Modifications to prevent Acid Reflux.  Rec. Avoid coffee, sodas, peppermint, garlic, onions, alcohol , citrus fruits, chocolate, tomatoes, fatty and spicey foods.  Avoid eating 2-3 hours before bedtime.   Please try to eat 3 meals per day plus snacks. Please try to increase calories in the diet.

## 2023-12-19 ENCOUNTER — Ambulatory Visit
Admission: RE | Admit: 2023-12-19 | Discharge: 2023-12-19 | Disposition: A | Discharge status: Home / Self Care | Payer: Medicare HMO | Source: Ambulatory Visit | Attending: Physician Assistant | Admitting: Physician Assistant

## 2023-12-19 DIAGNOSIS — R1312 Dysphagia, oropharyngeal phase: Secondary | ICD-10-CM | POA: Insufficient documentation

## 2023-12-19 DIAGNOSIS — R638 Other symptoms and signs concerning food and fluid intake: Secondary | ICD-10-CM | POA: Diagnosis not present

## 2023-12-19 DIAGNOSIS — K219 Gastro-esophageal reflux disease without esophagitis: Secondary | ICD-10-CM | POA: Diagnosis not present

## 2023-12-19 DIAGNOSIS — R633 Feeding difficulties, unspecified: Secondary | ICD-10-CM | POA: Diagnosis not present

## 2023-12-19 DIAGNOSIS — R1310 Dysphagia, unspecified: Secondary | ICD-10-CM | POA: Diagnosis not present

## 2023-12-19 NOTE — Therapy (Signed)
 Modified Barium Swallow Study  Patient Details  Name: Suzanne Leon MRN: [id number] Date of Birth: [birth year]  Today's Date: 12/19/2023  Modified Barium Swallow completed.  Full report located under Chart Review in the Imaging Section.  History of Present Illness 83 y.o. female who presents for MBSS. Pt with extensive GI hx including -2014: Esophageal manometry study: increased LES pressure. -2013: EGD:  No clear stricture, but she was empirically dilated to 48 French.  -01/2023:  CT chest normal / no abnormality of the esophagus.   -EGD 06/2021 was normal.  She underwent empiric esophageal dilation to 18 mm.  -02/2023 Barium swallow with tablet: moderate GERD, mild esophageal dysmotility characteristic of chronic reflux.    -07/2023: Neck CT was normal.  Pt continues to c/o difficulty swallowing including choking with liquids and globus sensation with solids. Pt with ~13lb unintentional weight loss in the last year. Pt with remote hx stroke. Pt also with hx anxiety, depression, chronic headaches, essential tremor, GERD, HLD. MBSS, 07/13/21, Patient presents with mild impairments in swallow function which appear consistent with normative aging and degenerative changes of the cervical spine.   Clinical Impression Findings from today's study appear c/w previous MBSS.  Patient presents with mild impairments in swallow function which appear consistent with normative aging and degenerative changes of the cervical spine. Oral stage is characterized by prolonged chewing, slow lingual transport with mild residue collection on lingual surface. Pt senses and completes a second swallow independently. Swallow initiation occurs at the valleculae for solids, and at the pyrigorm  of the epiglottis for liquids. The pharyngeal space is narrowed; degenerative changes noted in cervical spine per MRI. There is mild collection of residue in the valleculae and pyriform sinuses; as pt initiates her second swallow, this  reduces. Epiglottic deflection is incomplete with thin/lighter bolus as epiglottis abuts the posterior pharyngeal wall; with heavier boluses epiglottis deflects completely. There is transient penetration with multiple consecutive sips of thin liquid due to discoordinated timing. No aspiration was observed on this exam. Cervical esophageal phase was noted for reduced amplitude of opening, with minimal effect on bolus flow. A sweep of the proximal esophagus was performed in the upright position which revealed esophageal retention of barium. Overall pt's oropharyngeal swallowing appears functional; when pt reported symptoms of globus, this did not coordinate with presence of any residual in the pharynx. Patient was educated using study images for teachback. Son made aware of results, recommendations, and SLP POC. Recommend regular diet and thin liquids with safe swallowing strategies/aspiration precautions/reflux precautions as outlined below; no further ST indicated. Factors that may increase risk of adverse event in presence of aspiration Noe & Lianne 2021): Reduced cognitive function;Frail or deconditioned;Weak cough;Respiratory or GI disease  Swallow Evaluation Recommendations Recommendations: PO diet PO Diet Recommendation: Regular;Thin liquids (Level 0) Liquid Administration via: Spoon;Cup;Straw Medication Administration:  (as tolerated) Supervision: Patient able to self-feed Swallowing strategies  : Slow rate;Small bites/sips;Multiple dry swallows after each bite/sip;Follow solids with liquids Postural changes: Position pt fully upright for meals (upright 90 minutes after meals) Oral care recommendations: Oral care BID (2x/day) Recommended consults:  (GI following; referred for MBSS)     Delon Bangs, M.S., CCC-SLP Speech-Language Pathologist [facility] [phone] (ASCOM)   Delon CHRISTELLA Bangs 12/19/2023,1:42 PM

## 2023-12-19 NOTE — Progress Notes (Deleted)
Cardiology Office Note:    Date:  12/19/2023   ID:  Suzanne Leon, DOB 1941-09-07, MRN [id number]  PCP:  Dana Allan, MD   [facility] Providers Cardiologist:  None { Click to update primary MD,subspecialty MD or APP then REFRESH:1}    Referring MD: Dana Allan, MD   No chief complaint on file. ***  History of Present Illness:    MC Suzanne Leon is a 83 y.o. female seen at the request of Dr Clent Ridges for evaluation of dizziness. She has a history of HLD. She was evaluated by Dr Jacinto Halim in the past. Myoview study in Nov 2020 was normal. Echo in 2020 showed moderate MR with moderate pulmonary HTN. On follow up Echo in 2022 pulmonary pressures were normal. EF normal. She wore an event monitor in 2022 showing PACs with short runs of atrial tachy. No Afib. Repeat monitor in Jan this year showed frequent runs of SVT. No Afib. In 2022 she had an implantable loop recorder placed for evaluation of syncope. She had no recurrent syncope and loop recorder was explanted in Nov 2023.   Past Medical History:  Diagnosis Date   Alopecia totalis 10/27/2016   Anxiety    Arthritis    Chronic headaches    Depression    Dizziness    Educated about COVID-19 virus infection 04/02/2019   Essential tremor    HEAD. takes propranolol as needed.   Fatigue    GERD (gastroesophageal reflux disease)    HLD (hyperlipidemia)    Internal hemorrhoids    Low back pain    Osteopenia    Osteoporosis    Other specified disorders of nose and nasal sinuses 07/22/2017   Pharyngeal dysphagia 10/06/2023   Skin cancer    skin cancer   Weight loss 10/19/2018    Past Surgical History:  Procedure Laterality Date   ABDOMINAL HYSTERECTOMY     APPENDECTOMY     COLONOSCOPY     ESOPHAGEAL MANOMETRY  12/18/2012   Procedure: ESOPHAGEAL MANOMETRY (EM);  Surgeon: Hart Carwin, MD;  Location: [facility]COPY;  Service: Endoscopy;  Laterality: N/A;   ESOPHAGOGASTRODUODENOSCOPY (EGD) WITH PROPOFOL N/A 06/15/2021    Procedure: ESOPHAGOGASTRODUODENOSCOPY (EGD) WITH PROPOFOL;  Surgeon: Wyline Mood, MD;  Location: [facility] ENDOSCOPY;  Service: Gastroenterology;  Laterality: N/A;   EYE SURGERY     LAMINECTOMY     Loop explant     Explanted Biomonitor III loop recorder 10/12/2022   LOOP RECORDER IMPLANT  08/13/2021   Biotronik Biomonitor III   LUMBAR SPINE SURGERY     2010   remote T&A     TONSILLECTOMY      Current Medications: No outpatient medications have been marked as taking for the 12/27/23 encounter (Appointment) with [place], Anecia Nusbaum M, MD.     Allergies:   Crestor [rosuvastatin], Fosamax [alendronate], Mysoline [primidone], Prolia [denosumab], Prozac [fluoxetine], Sinemet [carbidopa-levodopa], Vitamin d analogs, and Zocor [simvastatin]   Social History   Socioeconomic History   Marital status: Widowed    Spouse name: Not on file   Number of children: 2   Years of education: Not on file   Highest education level: Not on file  Occupational History   Occupation: retired  Tobacco Use   Smoking status: Former    Current packs/day: 0.00    Average packs/day: 0.3 packs/day for 3.0 years (0.8 ttl pk-yrs)    Types: Cigarettes    Start date: 24    Quit date: 1960    Years since quitting: 59.0  Smokeless tobacco: Never   Tobacco comments:    when she was a teenager  Vaping Use   Vaping status: Never Used  Substance and Sexual Activity   Alcohol use: No   Drug use: No   Sexual activity: Not on file  Other Topics Concern   Not on file  Social History Narrative   Lives alone, independent. Retired.  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  Drinks coffee.   Walks regularly, rakes    Son, [name] lives next door   One grandchild   Attends church   Psych: denies depressed mood or anxiety   Social Drivers of Corporate investment banker Strain: Low Risk  (10/19/2018)   Overall Financial Resource Strain (CARDIA)    Difficulty of Paying Living Expenses: Not hard at all  Food Insecurity: Not on file   Transportation Needs: No Transportation Needs (10/19/2018)   PRAPARE - Administrator, Civil Service (Medical): No    Lack of Transportation (Non-Medical): No  Physical Activity: Not on file  Stress: Not on file  Social Connections: Not on file     Family History: The patient's ***family history includes Breast cancer in her daughter; Emphysema in her father; Migraines in her son; Throat cancer (age of onset: 26) in her mother; Thyroid disease in her daughter; Ulcers in her father. There is no history of Diabetes.  ROS:   Please see the history of present illness.    *** All other systems reviewed and are negative.  EKGs/Labs/Other Studies Reviewed:    The following studies were reviewed today: ***      Recent Labs: 09/21/2023: TSH 1.58 11/13/2023: ALT 17; BUN 13; Creatinine, Ser 1.15; Hemoglobin 12.2; Platelets 177; Potassium 4.0; Sodium 142  Recent Lipid Panel    Component Value Date/Time   CHOL 259 (H) 09/21/2023 0833   TRIG 75.0 09/21/2023 0833   HDL 76.10 09/21/2023 0833   CHOLHDL 3 09/21/2023 0833   VLDL 15.0 09/21/2023 0833   LDLCALC 168 (H) 09/21/2023 0833     Risk Assessment/Calculations:   {Does this patient have ATRIAL FIBRILLATION?:[phone]}  No BP recorded.  {Refresh Note OR Click here to enter BP  :1}***         Physical Exam:    VS:  There were no vitals taken for this visit.    Wt Readings from Last 3 Encounters:  12/09/23 105 lb 12.8 oz (48 kg)  11/16/23 105 lb (47.6 kg)  11/13/23 105 lb 2.6 oz (47.7 kg)     GEN: *** Well nourished, well developed in no acute distress HEENT: Normal NECK: No JVD; No carotid bruits LYMPHATICS: No lymphadenopathy CARDIAC: ***RRR, no murmurs, rubs, gallops RESPIRATORY:  Clear to auscultation without rales, wheezing or rhonchi  ABDOMEN: Soft, non-tender, non-distended MUSCULOSKELETAL:  No edema; No deformity  SKIN: Warm and dry NEUROLOGIC:  Alert and oriented x 3 PSYCHIATRIC:  Normal affect    ASSESSMENT:    No diagnosis found. PLAN:    In order of problems listed above:  ***      {Are you ordering a CV Procedure (e.g. stress test, cath, DCCV, TEE, etc)?   Press F2        :[id number]}    Medication Adjustments/Labs and Tests Ordered: Current medicines are reviewed at length with the patient today.  Concerns regarding medicines are outlined above.  No orders of the defined types were placed in this encounter.  No orders of the defined types were placed in this encounter.   There  are no Patient Instructions on file for this visit.   Signed, Joellyn Grandt [place], MD  12/19/2023 12:19 PM    [facility]

## 2023-12-21 DIAGNOSIS — R42 Dizziness and giddiness: Secondary | ICD-10-CM | POA: Diagnosis not present

## 2023-12-22 ENCOUNTER — Encounter: Payer: Self-pay | Admitting: Physical Therapy

## 2023-12-22 ENCOUNTER — Other Ambulatory Visit: Payer: Self-pay

## 2023-12-22 ENCOUNTER — Ambulatory Visit: Payer: Medicare HMO | Attending: Family Medicine | Admitting: Physical Therapy

## 2023-12-22 DIAGNOSIS — R2681 Unsteadiness on feet: Secondary | ICD-10-CM | POA: Diagnosis not present

## 2023-12-22 DIAGNOSIS — R42 Dizziness and giddiness: Secondary | ICD-10-CM | POA: Diagnosis not present

## 2023-12-22 NOTE — Therapy (Signed)
OUTPATIENT PHYSICAL THERAPY VESTIBULAR EVALUATION     Patient Name: Suzanne Leon MRN: [id number] DOB:12-26-1940, 83 y.o., female Today's Date: 12/22/2023  END OF SESSION:  PT End of Session - 12/22/23 1351     Visit Number 1    Number of Visits 1    Date for PT Re-Evaluation 12/22/23    Authorization Type [employer]    PT Start Time 1310    PT Stop Time 1347    PT Time Calculation (min) 37 min    Equipment Utilized During Treatment Gait belt    Activity Tolerance Patient tolerated treatment well    Behavior During Therapy WFL for tasks assessed/performed             Past Medical History:  Diagnosis Date   Alopecia totalis 10/27/2016   Anxiety    Arthritis    Chronic headaches    Depression    Dizziness    Educated about COVID-19 virus infection 04/02/2019   Essential tremor    HEAD. takes propranolol as needed.   Fatigue    GERD (gastroesophageal reflux disease)    HLD (hyperlipidemia)    Internal hemorrhoids    Low back pain    Osteopenia    Osteoporosis    Other specified disorders of nose and nasal sinuses 07/22/2017   Pharyngeal dysphagia 10/06/2023   Skin cancer    skin cancer   Weight loss 10/19/2018   Past Surgical History:  Procedure Laterality Date   ABDOMINAL HYSTERECTOMY     APPENDECTOMY     COLONOSCOPY     ESOPHAGEAL MANOMETRY  12/18/2012   Procedure: ESOPHAGEAL MANOMETRY (EM);  Surgeon: Hart Carwin, MD;  Location: [facility];  Service: Endoscopy;  Laterality: N/A;   ESOPHAGOGASTRODUODENOSCOPY (EGD) WITH PROPOFOL N/A 06/15/2021   Procedure: ESOPHAGOGASTRODUODENOSCOPY (EGD) WITH PROPOFOL;  Surgeon: Wyline Mood, MD;  Location: [facility] ENDOSCOPY;  Service: Gastroenterology;  Laterality: N/A;   EYE SURGERY     LAMINECTOMY     Loop explant     Explanted Biomonitor III loop recorder 10/12/2022   LOOP RECORDER IMPLANT  08/13/2021   Biotronik Biomonitor III   LUMBAR SPINE SURGERY     2010   remote T&A     TONSILLECTOMY      Patient Active Problem List   Diagnosis Date Noted   Dizziness 11/20/2023   History of cerebellar stroke 11/09/2023   History of arthritis 10/02/2023   GERD (gastroesophageal reflux disease) 08/28/2023   Constipation 08/28/2023   Vitamin B 12 deficiency 08/28/2023   Vitamin D deficiency 08/28/2023   Syncope and collapse 08/14/2021   Loop recorder: Biotronik 08/13/2021 for synocpe 08/14/2021   PVC (premature ventricular contraction) 04/02/2019   Globus sensation 10/19/2018   Essential hypertension 10/19/2018   Weight loss 10/19/2018   Insomnia 10/27/2016   Essential tremor 10/27/2016    PCP: Dana Allan, MD  REFERRING PROVIDER: Dana Allan, MD   REFERRING DIAG: R42 (ICD-10-CM) - Dizziness  THERAPY DIAG:  Unsteadiness on feet  ONSET DATE: Oct-Nov 2024  Rationale for Evaluation and Treatment: Rehabilitation  SUBJECTIVE:   SUBJECTIVE STATEMENT: Son reports that the patient had a reaction to a medication which caused dizzy spells here and there. After getting off this medication (Famotidine) over the course of a month her symptoms improved. This probably started around the last week of October. Patient denies having anymore dizziness. Had COVID in Jan 2022 and she had some dizziness from that. Denies head trauma, infection/illness, vision changes/double vision, hearing loss, tinnitus, migraines.  Reports sometimes tinnitus.   Pt accompanied by: self and son  PERTINENT HISTORY: Anxiety, chronic HAs, depression, dizziness, HLD, LBP, osteoporosis, skin CA, skin CA, loop recorder  PAIN:  Are you having pain? No  PRECAUTIONS: Fall  RED FLAGS: None   WEIGHT BEARING RESTRICTIONS: No  FALLS: Has patient fallen in last 6 months? No  LIVING ENVIRONMENT: Lives with: lives alone Lives in: House/apartment Stairs:  1-3 steps to enter; 1 story home  Has following equipment at home: None  PLOF: Independent  PATIENT GOALS: "not sure"  OBJECTIVE:  Note: Objective  measures were completed at Evaluation unless otherwise noted.  DIAGNOSTIC FINDINGS: 11/10/23 brain MRI: No acute intracranial process. No evidence of acute or subacute infarct.  COGNITION: Overall cognitive status:  son assists with subjective report; pt appears to have somewhat limited memory    SENSATION: Pt denies N/T in UEs/LEs   POSTURE:  rounded shoulders, forward head, increased thoracic kyphosis, weight shift left, and in sitting  ; head tilted R; head/neck tremor at rest   GAIT: Gait pattern: reduced step length, slowed  Assistive device utilized: None Level of assistance: Modified independence   PATIENT SURVEYS:  FOTO NT pt denies dizziness   VESTIBULAR ASSESSMENT:  GENERAL OBSERVATION: pt does not have glasses but reports "I guess you could say I do need some"   OCULOMOTOR EXAM:  Ocular Alignment: normal  Ocular ROM: No Limitations  Spontaneous Nystagmus: absent  Gaze-Induced Nystagmus: absent  Smooth Pursuits: intact  Saccades: intact  Convergence/Divergence: ~ 2 cm    VESTIBULAR - OCULAR REFLEX:   Slow VOR: Normal; slow  VOR Cancellation: Normal  Head-Impulse Test: HIT Right: negative HIT Left: negative      POSITIONAL TESTING:  Right Roll Test: negative;  Left Roll Test: negative  Right Sidelying: negative Left Sidelying: negative    [facility] PT Assessment - 12/22/23 0001       Standardized Balance Assessment   Standardized Balance Assessment Dynamic Gait Index      Dynamic Gait Index   Level Surface Moderate Impairment    Change in Gait Speed Normal    Gait with Horizontal Head Turns Normal    Gait with Vertical Head Turns Normal    Gait and Pivot Turn Normal    Step Over Obstacle Moderate Impairment    Step Around Obstacles Mild Impairment    Steps Normal    Total Score 19                                                                                                                                       TREATMENT DATE:  12/22/23   PATIENT EDUCATION: Education details: prognosis, 1 time visit, HEP- to be performed at a counter top for safety Person educated: Patient and Child(ren) Education method: Explanation, Demonstration, Tactile cues, Verbal cues, and Handouts Education comprehension: verbalized understanding  HOME EXERCISE PROGRAM: Access Code: [email or web address] URL: [email or web address] Date:  12/22/2023 Prepared by: [facility] - [facility]  Exercises - Standing Toe Taps  - 1 x daily - 5 x weekly - 2 sets - 10 reps - Standing with Head Rotation  - 1 x daily - 5 x weekly - 2 sets - 30 sec hold - Romberg Stance with Head Nods  - 1 x daily - 5 x weekly - 2 sets - 30 sec hold - Tandem Walking with Counter Support  - 1 x daily - 5 x weekly - 2 sets - 5 reps  GOALS: Goals reviewed with patient? Yes  SHORT TERM GOALS=LTGs: Target date: 12/22/23  Patient to be independent with initial HEP. Baseline: HEP provided  Goal status: MET    ASSESSMENT:  CLINICAL IMPRESSION:   Patient is an 83 y/o F presenting to OPPT with c/o dizziness which started in October/November 2024. Son reports that this dizziness ha since resolved after stopping Famotidine. Denies head trauma, infection/illness, vision changes/double vision, hearing loss. Reports occasional tinnitus. Exam today grossly normal and balance testing indicates a decreased risk of falls. Patient was educated on balance HEP to work on at home; she reported understanding. Patient does not require OPPT services at this time.    OBJECTIVE IMPAIRMENTS:  mild imbalance .   ACTIVITY LIMITATIONS:  none  PARTICIPATION LIMITATIONS:  none  PERSONAL FACTORS: Age, Past/current experiences, and 3+ comorbidities: Anxiety, chronic HAs, depression, dizziness, HLD, LBP, osteoporosis, skin CA, skin CA, loop recorder  are also affecting patient's functional outcome.   REHAB POTENTIAL: Good  CLINICAL DECISION MAKING:  Stable/uncomplicated  EVALUATION COMPLEXITY: Low   PLAN:  PT FREQUENCY: one time visit  PT DURATION: other: -  PLANNED INTERVENTIONS: 97110-Therapeutic exercises, 97530- Therapeutic activity, [id number]- Neuromuscular re-education, 97535- Self Care, and 96295- Manual therapy  PLAN FOR NEXT SESSION: DC at this time    Baldemar Friday, PT, DPT 12/22/23 2:07 PM  [facility] at [facility] [address] [place], [place] [address] Phone # [phone] Fax # [phone]

## 2023-12-27 ENCOUNTER — Ambulatory Visit: Payer: Medicare HMO | Admitting: Cardiology

## 2024-01-10 ENCOUNTER — Ambulatory Visit: Payer: Medicare HMO | Admitting: Physician Assistant

## 2024-01-12 ENCOUNTER — Ambulatory Visit (INDEPENDENT_AMBULATORY_CARE_PROVIDER_SITE_OTHER): Payer: Medicare HMO | Admitting: Nurse Practitioner

## 2024-01-12 ENCOUNTER — Encounter: Payer: Self-pay | Admitting: Nurse Practitioner

## 2024-01-12 VITALS — BP 124/66 | HR 71 | Temp 98.4°F | Ht 68.0 in | Wt 105.0 lb

## 2024-01-12 DIAGNOSIS — J029 Acute pharyngitis, unspecified: Secondary | ICD-10-CM

## 2024-01-12 DIAGNOSIS — R059 Cough, unspecified: Secondary | ICD-10-CM

## 2024-01-12 LAB — POCT INFLUENZA A/B
Influenza A, POC: NEGATIVE
Influenza B, POC: NEGATIVE

## 2024-01-12 LAB — POCT RAPID STREP A (OFFICE): Rapid Strep A Screen: NEGATIVE

## 2024-01-12 NOTE — Progress Notes (Signed)
 Established Patient Office Visit  Subjective:  Patient ID: Suzanne Leon, female    DOB: 18-Oct-1941  Age: 83 y.o. MRN: [id number]  CC:  Chief Complaint  Patient presents with   Acute Visit    Sore throat, runny nose   Discussed the use of a AI scribe software for clinical note transcription with the patient, who gave verbal consent to proceed.  HPI  Suzanne Leon presents  with a sore throat and body aches.  She has a sore throat that began last night, described as intense and severe enough to disrupt her sleep. She uses Cepacol lozenges for temporary relief, but the effect is short-lived.  She experiences body aches accompanying the sore throat. No ear pain is present, although there is some soreness in the throat area.  She has a runny nose and intermittent cough, but these symptoms are not consistent.   HPI   Past Medical History:  Diagnosis Date   Alopecia totalis 10/27/2016   Anxiety    Arthritis    Chronic headaches    Depression    Dizziness    Educated about COVID-19 virus infection 04/02/2019   Essential tremor    HEAD. takes propranolol  as needed.   Fatigue    GERD (gastroesophageal reflux disease)    HLD (hyperlipidemia)    Internal hemorrhoids    Low back pain    Osteopenia    Osteoporosis    Other specified disorders of nose and nasal sinuses 07/22/2017   Pharyngeal dysphagia 10/06/2023   Skin cancer    skin cancer   Weight loss 10/19/2018    Past Surgical History:  Procedure Laterality Date   ABDOMINAL HYSTERECTOMY     APPENDECTOMY     COLONOSCOPY     ESOPHAGEAL MANOMETRY  12/18/2012   Procedure: ESOPHAGEAL MANOMETRY (EM);  Surgeon: Princella CHRISTELLA Nida, MD;  Location: [facility];  Service: Endoscopy;  Laterality: N/A;   ESOPHAGOGASTRODUODENOSCOPY (EGD) WITH PROPOFOL  N/A 06/15/2021   Procedure: ESOPHAGOGASTRODUODENOSCOPY (EGD) WITH PROPOFOL ;  Surgeon: Therisa Bi, MD;  Location: [facility] END[facility]COPY;  Service: Gastroenterology;  Laterality: N/A;    EYE SURGERY     LAMINECTOMY     Loop explant     Explanted Biomonitor III loop recorder 10/12/2022   LOOP RECORDER IMPLANT  08/13/2021   Biotronik Biomonitor III   LUMBAR SPINE SURGERY     2010   remote T&A     TONSILLECTOMY      Family History  Problem Relation Age of Onset   Throat cancer Mother 17   Ulcers Father        peptic   Emphysema Father    Breast cancer Daughter    Thyroid  disease Daughter    Migraines Son    Diabetes Neg Hx     Social History   Socioeconomic History   Marital status: Widowed    Spouse name: Not on file   Number of children: 2   Years of education: Not on file   Highest education level: Not on file  Occupational History   Occupation: retired  Tobacco Use   Smoking status: Former    Current packs/day: 0.00    Average packs/day: 0.3 packs/day for 3.0 years (0.8 ttl pk-yrs)    Types: Cigarettes    Start date: 46    Quit date: 1960    Years since quitting: 65.1   Smokeless tobacco: Never   Tobacco comments:    when she was a teenager  Vaping Use   Vaping  status: Never Used  Substance and Sexual Activity   Alcohol  use: No   Drug use: No   Sexual activity: Not on file  Other Topics Concern   Not on file  Social History Narrative   Lives alone, independent. Retired.  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  Drinks coffee.   Walks regularly, rakes    Son, Jerel lives next door   One grandchild   Attends church   Psych: denies depressed mood or anxiety   Social Drivers of Corporate Investment Banker Strain: Low Risk  (10/19/2018)   Overall Financial Resource Strain (CARDIA)    Difficulty of Paying Living Expenses: Not hard at all  Food Insecurity: Not on file  Transportation Needs: No Transportation Needs (10/19/2018)   PRAPARE - Administrator, Civil Service (Medical): No    Lack of Transportation (Non-Medical): No  Physical Activity: Not on file  Stress: Not on file  Social Connections: Not on file  Intimate Partner  Violence: Not on file     Outpatient Medications Prior to Visit  Medication Sig Dispense Refill   acetaminophen  (TYLENOL ) 650 MG CR tablet Take 1 tablet (650 mg total) by mouth every 8 (eight) hours as needed for pain.     amLODipine  (NORVASC ) 2.5 MG tablet Take 1 tablet (2.5 mg total) by mouth every evening. 90 tablet 3   meclizine  (ANTIVERT ) 12.5 MG tablet Take 1 tablet (12.5 mg total) by mouth 3 (three) times daily as needed for dizziness. 30 tablet 0   meloxicam  (MOBIC ) 7.5 MG tablet Take 7.5 mg by mouth daily.     pantoprazole  (PROTONIX ) 40 MG tablet Take 1 tablet (40 mg total) by mouth daily. 30 tablet 2   polyethylene glycol powder (GLYCOLAX /MIRALAX ) 17 GM/SCOOP powder Take 17 g by mouth daily.     propranolol  (INDERAL ) 10 MG tablet Take 10 mg by mouth daily as needed.     SV MAGNESIUM PO Take 249 mg by mouth daily.     Vitamin D , Ergocalciferol , (DRISDOL ) 1.25 MG (50000 UNIT) CAPS capsule Take 1 capsule (50,000 Units total) by mouth every 7 (seven) days. 12 capsule 1   No facility-administered medications prior to visit.    Allergies  Allergen Reactions   Crestor [Rosuvastatin]     Myalgias   Fosamax [Alendronate]     Bones aching   Mysoline [Primidone]     Dizzy and nausea   Prolia  [Denosumab ]     Bone aching   Prozac [Fluoxetine]     Fatigue    Sinemet [Carbidopa-Levodopa]     Nausea   Vitamin D  Analogs     Stomach/bone pain   Zocor [Simvastatin]     myalgias    ROS Review of Systems Negative unless indicated in HPI.    Objective:    Physical Exam Constitutional:      Appearance: Normal appearance.  HENT:     Right Ear: Tympanic membrane normal. Tympanic membrane is not erythematous.     Left Ear: Tympanic membrane normal. Tympanic membrane is not erythematous.     Nose:     Right Turbinates: Not enlarged.     Left Turbinates: Not enlarged.     Right Sinus: No maxillary sinus tenderness or frontal sinus tenderness.     Left Sinus: No maxillary sinus  tenderness or frontal sinus tenderness.     Mouth/Throat:     Mouth: Mucous membranes are moist.     Pharynx: Posterior oropharyngeal erythema and postnasal drip present. No pharyngeal swelling  or oropharyngeal exudate.     Tonsils: No tonsillar exudate.  Cardiovascular:     Rate and Rhythm: Normal rate and regular rhythm.  Pulmonary:     Effort: Pulmonary effort is normal.     Breath sounds: Normal breath sounds. No stridor. No wheezing.  Neurological:     General: No focal deficit present.     Mental Status: She is alert and oriented to person, place, and time. Mental status is at baseline.  Psychiatric:        Mood and Affect: Mood normal.        Behavior: Behavior normal.        Thought Content: Thought content normal.        Judgment: Judgment normal.     BP 124/66   Pulse 71   Temp 98.4 F (36.9 C)   Ht 5' 8 (1.727 m)   Wt 105 lb (47.6 kg)   SpO2 99%   BMI 15.97 kg/m  Wt Readings from Last 3 Encounters:  01/12/24 105 lb (47.6 kg)  12/09/23 105 lb 12.8 oz (48 kg)  11/16/23 105 lb (47.6 kg)     Health Maintenance  Topic Date Due   Zoster Vaccines- Shingrix (1 of 2) Never done   Medicare Annual Wellness (AWV)  01/25/2024   COVID-19 Vaccine (1 - 2024-25 season) 01/28/2024 (Originally 08/07/2023)   INFLUENZA VACCINE  03/05/2024 (Originally 07/07/2023)   Pneumonia Vaccine 21+ Years old (1 of 1 - PCV) 10/01/2024 (Originally 04/21/2006)   DEXA SCAN  10/01/2024 (Originally 04/21/2006)   DTaP/Tdap/Td (2 - Td or Tdap) 07/24/2025   HPV VACCINES  Aged Out    There are no preventive care reminders to display for this patient.  Lab Results  Component Value Date   TSH 1.58 09/21/2023   Lab Results  Component Value Date   WBC 5.1 11/13/2023   HGB 12.2 11/13/2023   HCT 37.4 11/13/2023   MCV 95.9 11/13/2023   PLT 177 11/13/2023   Lab Results  Component Value Date   NA 142 11/13/2023   K 4.0 11/13/2023   CO2 27 11/13/2023   GLUCOSE 108 (H) 11/13/2023   BUN 13  11/13/2023   CREATININE 1.15 (H) 11/13/2023   BILITOT 0.6 11/13/2023   ALKPHOS 74 11/13/2023   AST 24 11/13/2023   ALT 17 11/13/2023   PROT 6.5 11/13/2023   ALBUMIN 3.8 11/13/2023   CALCIUM  9.3 11/13/2023   ANIONGAP 8 11/13/2023   GFR 51.27 (L) 09/21/2023   Lab Results  Component Value Date   CHOL 259 (H) 09/21/2023   Lab Results  Component Value Date   HDL 76.10 09/21/2023   Lab Results  Component Value Date   LDLCALC 168 (H) 09/21/2023   Lab Results  Component Value Date   TRIG 75.0 09/21/2023   Lab Results  Component Value Date   CHOLHDL 3 09/21/2023   No results found for: HGBA1C    Assessment & Plan:  Acute viral pharyngitis Assessment & Plan: Acute onset of sore throat, body aches, and runny nose. Throat appears red on examination. No fever reported.  POCT strep and flu negative.Possible viral etiology. -Advise symptomatic treatment with throat lozenges and over-the-counter analgesics for body aches. -Consider adding an antihistamine like Zyrtec for postnasal drip. -Advised patient to keep us  posted if symptoms not improving.    Sore throat -     POCT Influenza A/B -     POCT rapid strep A    Follow-up: Return if symptoms worsen  or fail to improve.   Donnika Kucher, NP

## 2024-01-17 ENCOUNTER — Telehealth: Payer: Self-pay | Admitting: Family Medicine

## 2024-01-17 DIAGNOSIS — J029 Acute pharyngitis, unspecified: Secondary | ICD-10-CM | POA: Insufficient documentation

## 2024-01-17 NOTE — Telephone Encounter (Signed)
Copied from CRM [id number]. Topic: Medicare AWV >> Jan 17, 2024  9:27 AM Payton Doughty wrote: Reason for CRM: Called LVM 01/17/2024 to schedule AWV. Please schedule office or virtual visits.  Verlee Rossetti; Care Guide Ambulatory Clinical Support [place] [facility] Direct Dial: [phone]

## 2024-01-17 NOTE — Assessment & Plan Note (Signed)
Acute onset of sore throat, body aches, and runny nose. Throat appears red on examination. No fever reported.  POCT strep and flu negative.Possible viral etiology. -Advise symptomatic treatment with throat lozenges and over-the-counter analgesics for body aches. -Consider adding an antihistamine like Zyrtec for postnasal drip. -Advised patient to keep [place] posted if symptoms not improving.

## 2024-02-08 ENCOUNTER — Ambulatory Visit: Admitting: *Deleted

## 2024-02-08 ENCOUNTER — Telehealth: Payer: Self-pay

## 2024-02-08 VITALS — BP 104/61 | Ht 68.0 in | Wt 105.0 lb

## 2024-02-08 DIAGNOSIS — Z0000 Encounter for general adult medical examination without abnormal findings: Secondary | ICD-10-CM

## 2024-02-08 NOTE — Telephone Encounter (Signed)
 Copied from CRM [id number]. Topic: General - Other >> Feb 08, 2024  3:47 PM Yolanda T wrote: Reason for CRM: patient called stated he had the unknown numbers blocked so they missed the call for the AWV. Attempted to reach practice and patient advised he was able to take the block off and the line was disconnected. Please f/u with patient

## 2024-02-08 NOTE — Progress Notes (Deleted)
 Subjective:   Suzanne Leon is a 83 y.o. who presents for a Medicare Wellness preventive visit.  Visit Complete: Virtual I connected with  Suzanne Leon on 02/08/24 by a audio enabled telemedicine application and verified that I am speaking with the correct person using two identifiers. Patient's son Suzanne Leon helped with the visit.  Patient Location: Home  Provider Location: Home Office  I discussed the limitations of evaluation and management by telemedicine. The patient expressed understanding and agreed to proceed.  Vital Signs: Because this visit was a virtual/telehealth visit, some criteria may be missing or patient reported. Any vitals not documented were not able to be obtained and vitals that have been documented are patient reported.  VideoDeclined- This patient declined Librarian, academic. Therefore the visit was completed with audio only.  AWV Questionnaire: No: Patient Medicare AWV questionnaire was not completed prior to this visit.        Objective:    Today's Vitals   02/08/24 1547  BP: 104/61  Weight: 105 lb (47.6 kg)  Height: 5\' 8"  (1.727 m)   Body mass index is 15.97 kg/m.     12/22/2023    1:11 PM 11/13/2023    3:34 PM 06/15/2021    9:12 AM 03/29/2019    5:51 PM 07/25/2015    9:18 PM  Advanced Directives  Does Patient Have a Medical Advance Directive? Yes Yes Yes No No  Type of Best boy of [place];Living will    Does patient want to make changes to medical advance directive? No - Patient declined      Would patient like information on creating a medical advance directive?    No - Patient declined     Current Medications (verified) Outpatient Encounter Medications as of 02/08/2024  Medication Sig   acetaminophen (TYLENOL) 650 MG CR tablet Take 1 tablet (650 mg total) by mouth every 8 (eight) hours as needed for pain.   amLODipine (NORVASC) 2.5 MG tablet Take 1 tablet (2.5 mg total) by mouth  every evening.   meclizine (ANTIVERT) 12.5 MG tablet Take 1 tablet (12.5 mg total) by mouth 3 (three) times daily as needed for dizziness.   meloxicam (MOBIC) 7.5 MG tablet Take 7.5 mg by mouth daily.   pantoprazole (PROTONIX) 40 MG tablet Take 1 tablet (40 mg total) by mouth daily.   polyethylene glycol powder (GLYCOLAX/MIRALAX) 17 GM/SCOOP powder Take 17 g by mouth daily.   propranolol (INDERAL) 10 MG tablet Take 10 mg by mouth daily as needed.   SV MAGNESIUM PO Take 249 mg by mouth daily.   Vitamin D, Ergocalciferol, (DRISDOL) 1.25 MG (50000 UNIT) CAPS capsule Take 1 capsule (50,000 Units total) by mouth every 7 (seven) days.   No facility-administered encounter medications on file as of 02/08/2024.    Allergies (verified) Crestor [rosuvastatin], Fosamax [alendronate], Mysoline [primidone], Prolia [denosumab], Prozac [fluoxetine], Sinemet [carbidopa-levodopa], Vitamin d analogs, and Zocor [simvastatin]   History: Past Medical History:  Diagnosis Date   Alopecia totalis 10/27/2016   Anxiety    Arthritis    Chronic headaches    Depression    Dizziness    Educated about COVID-19 virus infection 04/02/2019   Essential tremor    HEAD. takes propranolol as needed.   Fatigue    GERD (gastroesophageal reflux disease)    HLD (hyperlipidemia)    Internal hemorrhoids    Low back pain    Osteopenia    Osteoporosis    Other specified  disorders of nose and nasal sinuses 07/22/2017   Pharyngeal dysphagia 10/06/2023   Skin cancer    skin cancer   Weight loss 10/19/2018   Past Surgical History:  Procedure Laterality Date   ABDOMINAL HYSTERECTOMY     APPENDECTOMY     COLONOSCOPY     ESOPHAGEAL MANOMETRY  12/18/2012   Procedure: ESOPHAGEAL MANOMETRY (EM);  Surgeon: Hart Carwin, MD;  Location: [facility];  Service: Endoscopy;  Laterality: N/A;   ESOPHAGOGASTRODUODENOSCOPY (EGD) WITH PROPOFOL N/A 06/15/2021   Procedure: ESOPHAGOGASTRODUODENOSCOPY (EGD) WITH PROPOFOL;  Surgeon: Wyline Mood, MD;  Location: [facility] ENDOSCOPY;  Service: Gastroenterology;  Laterality: N/A;   EYE SURGERY     LAMINECTOMY     Loop explant     Explanted Biomonitor III loop recorder 10/12/2022   LOOP RECORDER IMPLANT  08/13/2021   Biotronik Biomonitor III   LUMBAR SPINE SURGERY     2010   remote T&A     TONSILLECTOMY     Family History  Problem Relation Age of Onset   Throat cancer Mother 56   Ulcers Father        peptic   Emphysema Father    Breast cancer Daughter    Thyroid disease Daughter    Migraines Son    Diabetes Neg Hx    Social History   Socioeconomic History   Marital status: Widowed    Spouse name: Not on file   Number of children: 2   Years of education: Not on file   Highest education level: Not on file  Occupational History   Occupation: retired  Tobacco Use   Smoking status: Former    Current packs/day: 0.00    Average packs/day: 0.3 packs/day for 3.0 years (0.8 ttl pk-yrs)    Types: Cigarettes    Start date: 69    Quit date: 1960    Years since quitting: 65.2   Smokeless tobacco: Never   Tobacco comments:    when she was a teenager  Vaping Use   Vaping status: Never Used  Substance and Sexual Activity   Alcohol use: No   Drug use: No   Sexual activity: Not on file  Other Topics Concern   Not on file  Social History Narrative   Lives alone, independent. Retired.  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  Drinks coffee.   Walks regularly, rakes    Son, Suzanne Leon lives next door   One grandchild   Attends church   Psych: denies depressed mood or anxiety   Social Drivers of Corporate investment banker Strain: Low Risk  (10/19/2018)   Overall Financial Resource Strain (CARDIA)    Difficulty of Paying Living Expenses: Not hard at all  Food Insecurity: Not on file  Transportation Needs: No Transportation Needs (10/19/2018)   PRAPARE - Administrator, Civil Service (Medical): No    Lack of Transportation (Non-Medical): No  Physical Activity: Not on file   Stress: Not on file  Social Connections: Not on file    Tobacco Counseling Counseling given: Not Answered Tobacco comments: when she was a teenager    Clinical Intake:  Pre-visit preparation completed: Yes  Pain : No/denies pain     BMI - recorded: 15.97 Nutritional Status: BMI <19  Underweight Nutritional Risks: Nausea/ vomitting/ diarrhea (no pain, gluten intolerant) Diabetes: No  How often do you need to have someone help you when you read instructions, pamphlets, or other written materials from your doctor or pharmacy?: 1 - Never  Interpreter  Needed?: No  Information entered by :: R. Raylyn Speckman LPN   Activities of Daily Living      No data to display          Patient Care Team: Dana Allan, MD as PCP - General (Family Medicine)  Indicate any recent Medical Services you may have received from other than [facility] providers in the past year (date may be approximate).     Assessment:   This is a routine wellness examination for Suzanne Leon.  Hearing/Vision screen No results found.   Goals Addressed   None    Depression Screen     01/12/2024   11:28 AM 11/16/2023   11:08 AM 09/30/2023   11:26 AM 08/22/2023    3:01 PM  PHQ 2/9 Scores  PHQ - 2 Score 0 0 0 0  PHQ- 9 Score 0 4 1 4     Fall Risk     01/12/2024   11:28 AM 11/16/2023   11:08 AM 09/30/2023   11:26 AM 08/22/2023    3:00 PM 12/10/2019    2:45 PM  Fall Risk   Falls in the past year? 0 0 0 0 0  Number falls in past yr: 0 0 0 0 0  Injury with Fall? 0 0 0 0 0  Risk for fall due to : No Fall Risks No Fall Risks No Fall Risks No Fall Risks   Follow up Falls evaluation completed Falls evaluation completed;Education provided Falls evaluation completed Falls evaluation completed Falls evaluation completed    MEDICARE RISK AT HOME:     TIMED UP AND GO:  Was the test performed?  No  Cognitive Function: 6CIT completed        Immunizations Immunization History  Administered Date(s) Administered    Rabies, IM 07/25/2015, 07/28/2015, 08/01/2015, 08/08/2015   Tdap 07/25/2015    Screening Tests Health Maintenance  Topic Date Due   Zoster Vaccines- Shingrix (1 of 2) Never done   COVID-19 Vaccine (1 - 2024-25 season) Never done   Medicare Annual Wellness (AWV)  01/25/2024   INFLUENZA VACCINE  03/05/2024 (Originally 07/07/2023)   Pneumonia Vaccine 67+ Years old (1 of 1 - PCV) 10/01/2024 (Originally 04/21/2006)   DEXA SCAN  10/01/2024 (Originally 04/21/2006)   DTaP/Tdap/Td (2 - Td or Tdap) 07/24/2025   HPV VACCINES  Aged Out    Health Maintenance  Health Maintenance Due  Topic Date Due   Zoster Vaccines- Shingrix (1 of 2) Never done   COVID-19 Vaccine (1 - 2024-25 season) Never done   Medicare Annual Wellness (AWV)  01/25/2024   Health Maintenance Items Addressed: Patient declines vaccines. Patient declines Bone density.  Additional Screening:  Vision Screening: Recommended annual ophthalmology exams for early detection of glaucoma and other disorders of the eye. Has an upcoming appointment scheduled.  Dental Screening: Recommended annual dental exams for proper oral hygiene  Community Resource Referral / Chronic Care Management: CRR required this visit?  No   CCM required this visit?  No     Plan:     I have personally reviewed and noted the following in the patient's chart:   Medical and social history Use of alcohol, tobacco or illicit drugs  Current medications and supplements including opioid prescriptions. Patient is not currently taking opioid prescriptions. Functional ability and status Nutritional status Physical activity Advanced directives List of other physicians Hospitalizations, surgeries, and ER visits in previous 12 months Vitals Screenings to include cognitive, depression, and falls Referrals and appointments  In addition, I have reviewed  and discussed with patient certain preventive protocols, quality metrics, and best practice recommendations.  A written personalized care plan for preventive services as well as general preventive health recommendations were provided to patient.     Sydell Axon, LPN   12/11/1094   After Visit Summary: (MyChart) Due to this being a telephonic visit, the after visit summary with patients personalized plan was offered to patient via MyChart   Notes: Nothing significant to report at this time.

## 2024-02-08 NOTE — Patient Instructions (Signed)
 Ms. Cosner , Thank you for taking time to come for your Medicare Wellness Visit. I appreciate your ongoing commitment to your health goals. Please review the following plan we discussed and let me know if I can assist you in the future.   Referrals/Orders/Follow-Ups/Clinician Recommendations: Consider updating vaccines.  This is a list of the screening recommended for you and due dates:  Health Maintenance  Topic Date Due   Zoster (Shingles) Vaccine (1 of 2) Never done   COVID-19 Vaccine (1 - 2024-25 season) Never done   Flu Shot  03/05/2024*   Pneumonia Vaccine (1 of 1 - PCV) 10/01/2024*   DEXA scan (bone density measurement)  10/01/2024*   Medicare Annual Wellness Visit  02/07/2025   DTaP/Tdap/Td vaccine (2 - Td or Tdap) 07/24/2025   HPV Vaccine  Aged Out  *Topic was postponed. The date shown is not the original due date.    Advanced directives: (Declined) Advance directive discussed with you today. Even though you declined this today, please call our office should you change your mind, and we can give you the proper paperwork for you to fill out.  Next Medicare Annual Wellness Visit scheduled for next year: Yes 02/12/25 @ 3:00

## 2024-02-09 ENCOUNTER — Other Ambulatory Visit: Payer: Self-pay | Admitting: Family Medicine

## 2024-02-09 MED ORDER — PROPRANOLOL HCL 10 MG PO TABS: 10.0000 mg | ORAL_TABLET | Freq: Every day | ORAL | 0 refills | Status: DC | PRN

## 2024-02-09 MED ORDER — MELOXICAM 7.5 MG PO TABS: 7.5000 mg | ORAL_TABLET | Freq: Every day | ORAL | 0 refills | Status: DC

## 2024-02-09 NOTE — Telephone Encounter (Signed)
 Copied from CRM [id number]. Topic: Clinical - Medication Refill >> Feb 09, 2024  9:08 AM [place] A wrote: Most Recent Primary Care Visit:  Provider: Kara Dies  Department: [facility]  Visit Type: ACUTE  Date: 01/12/2024  Medication: propranolol (INDERAL) 10 MG tablet; meloxicam (MOBIC) 7.5 MG tablet  Has the patient contacted their pharmacy? Yes (Agent: If no, request that the patient contact the pharmacy for the refill. If patient does not wish to contact the pharmacy document the reason why and proceed with request.) (Agent: If yes, when and what did the pharmacy advise?)  Is this the correct pharmacy for this prescription? Yes If no, delete pharmacy and type the correct one.  This is the patient's preferred pharmacy:  [facility] [address], [place] - [address] [place] [address] [place] [address] Phone: [phone] Fax: [phone]   Has the prescription been filled recently? No  Is the patient out of the medication? Yes  Has the patient been seen for an appointment in the last year OR does the patient have an upcoming appointment? Yes  Can we respond through MyChart? Yes  Agent: Please be advised that Rx refills may take up to 3 business days. We ask that you follow-up with your pharmacy.

## 2024-02-10 NOTE — Telephone Encounter (Signed)
 Patient was called and AWV was completed.

## 2024-02-10 NOTE — Progress Notes (Addendum)
 Subjective:   Suzanne Leon is a 83 y.o. who presents for a Medicare Wellness preventive visit.  Visit Complete: Virtual I connected with  Suzanne Leon on 02/10/24 by a audio enabled telemedicine application and verified that I am speaking with the correct person using two identifiers. Patient's son Suzanne Leon helped with the visit.  Patient Location: Home  Provider Location: Home Office  I discussed the limitations of evaluation and management by telemedicine. The patient expressed understanding and agreed to proceed.  Vital Signs: Because this visit was a virtual/telehealth visit, some criteria may be missing or patient reported. Any vitals not documented were not able to be obtained and vitals that have been documented are patient reported.  VideoDeclined- This patient declined Librarian, academic. Therefore the visit was completed with audio only.  AWV Questionnaire: No: Patient Medicare AWV questionnaire was not completed prior to this visit.  Cardiac Risk Factors include: advanced age (>80men, >61 women);hypertension     Objective:    Today's Vitals   02/08/24 1547  BP: 104/61  Weight: 105 lb (47.6 kg)  Height: 5\' 8"  (1.727 m)   Body mass index is 15.97 kg/m.     02/08/2024    4:16 PM 12/22/2023    1:11 PM 11/13/2023    3:34 PM 06/15/2021    9:12 AM 03/29/2019    5:51 PM 07/25/2015    9:18 PM  Advanced Directives  Does Patient Have a Medical Advance Directive? No Yes Yes Yes No No  Type of Aeronautical engineer of [place];Living will    Does patient want to make changes to medical advance directive?  No - Patient declined      Would patient like information on creating a medical advance directive? No - Patient declined    No - Patient declined     Current Medications (verified) Outpatient Encounter Medications as of 02/08/2024  Medication Sig   acetaminophen (TYLENOL) 650 MG CR tablet Take 1 tablet (650 mg total) by  mouth every 8 (eight) hours as needed for pain.   amLODipine (NORVASC) 2.5 MG tablet Take 1 tablet (2.5 mg total) by mouth every evening.   cholecalciferol (VITAMIN D3) 10 MCG/ML LIQD oral liquid Take 400 Units by mouth daily. Takes 8 drops daily  800 international unit daily   meclizine (ANTIVERT) 12.5 MG tablet Take 1 tablet (12.5 mg total) by mouth 3 (three) times daily as needed for dizziness.   pantoprazole (PROTONIX) 40 MG tablet Take 1 tablet (40 mg total) by mouth daily.   polyethylene glycol powder (GLYCOLAX/MIRALAX) 17 GM/SCOOP powder Take 17 g by mouth daily.   Vitamin D, Ergocalciferol, (DRISDOL) 1.25 MG (50000 UNIT) CAPS capsule Take 1 capsule (50,000 Units total) by mouth every 7 (seven) days.   [DISCONTINUED] meloxicam (MOBIC) 7.5 MG tablet Take 7.5 mg by mouth daily.   [DISCONTINUED] propranolol (INDERAL) 10 MG tablet Take 10 mg by mouth daily as needed.   SV MAGNESIUM PO Take 249 mg by mouth daily. (Patient not taking: Reported on 02/08/2024)   No facility-administered encounter medications on file as of 02/08/2024.    Allergies (verified) Crestor [rosuvastatin], Fosamax [alendronate], Mysoline [primidone], Pepcid [famotidine], Prolia [denosumab], Prozac [fluoxetine], Sinemet [carbidopa-levodopa], Vitamin d analogs, and Zocor [simvastatin]   History: Past Medical History:  Diagnosis Date   Alopecia totalis 10/27/2016   Anxiety    Arthritis    Chronic headaches    Depression    Dizziness    Educated about  COVID-19 virus infection 04/02/2019   Essential tremor    HEAD. takes propranolol as needed.   Fatigue    GERD (gastroesophageal reflux disease)    HLD (hyperlipidemia)    Internal hemorrhoids    Low back pain    Osteopenia    Osteoporosis    Other specified disorders of nose and nasal sinuses 07/22/2017   Pharyngeal dysphagia 10/06/2023   Skin cancer    skin cancer   Weight loss 10/19/2018   Past Surgical History:  Procedure Laterality Date   ABDOMINAL  HYSTERECTOMY     APPENDECTOMY     COLONOSCOPY     ESOPHAGEAL MANOMETRY  12/18/2012   Procedure: ESOPHAGEAL MANOMETRY (EM);  Surgeon: Hart Carwin, MD;  Location: [facility];  Service: Endoscopy;  Laterality: N/A;   ESOPHAGOGASTRODUODENOSCOPY (EGD) WITH PROPOFOL N/A 06/15/2021   Procedure: ESOPHAGOGASTRODUODENOSCOPY (EGD) WITH PROPOFOL;  Surgeon: Wyline Mood, MD;  Location: [facility];  Service: Gastroenterology;  Laterality: N/A;   EYE SURGERY     LAMINECTOMY     Loop explant     Explanted Biomonitor III loop recorder 10/12/2022   LOOP RECORDER IMPLANT  08/13/2021   Biotronik Biomonitor III   LUMBAR SPINE SURGERY     2010   remote T&A     TONSILLECTOMY     Family History  Problem Relation Age of Onset   Throat cancer Mother 2   Ulcers Father        peptic   Emphysema Father    Breast cancer Daughter    Thyroid disease Daughter    Migraines Son    Diabetes Neg Hx    Social History   Socioeconomic History   Marital status: Widowed    Spouse name: Not on file   Number of children: 2   Years of education: Not on file   Highest education level: Not on file  Occupational History   Occupation: retired  Tobacco Use   Smoking status: Former    Current packs/day: 0.00    Average packs/day: 0.3 packs/day for 3.0 years (0.8 ttl pk-yrs)    Types: Cigarettes    Start date: 37    Quit date: 1960    Years since quitting: 65.2   Smokeless tobacco: Never   Tobacco comments:    when she was a teenager  Vaping Use   Vaping status: Never Used  Substance and Sexual Activity   Alcohol use: No   Drug use: No   Sexual activity: Not on file  Other Topics Concern   Not on file  Social History Narrative   Lives alone, independent. Retired.  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  Drinks coffee.   Walks regularly, rakes    Son, Suzanne Leon lives next door   One grandchild   Attends church   Psych: denies depressed mood or anxiety   Social Drivers of Corporate investment banker Strain: Low  Risk  (02/08/2024)   Overall Financial Resource Strain (CARDIA)    Difficulty of Paying Living Expenses: Not hard at all  Food Insecurity: No Food Insecurity (02/08/2024)   Hunger Vital Sign    Worried About Running Out of Food in the Last Year: Never true    Ran Out of Food in the Last Year: Never true  Transportation Needs: No Transportation Needs (02/08/2024)   PRAPARE - Administrator, Civil Service (Medical): No    Lack of Transportation (Non-Medical): No  Physical Activity: Insufficiently Active (02/08/2024)   Exercise Vital Sign  Days of Exercise per Week: 1 day    Minutes of Exercise per Session: 40 min  Stress: Stress Concern Present (02/08/2024)   [employer] of Occupational Health - Occupational Stress Questionnaire    Feeling of Stress : To some extent  Social Connections: Moderately Isolated (02/08/2024)   Social Connection and Isolation Panel [NHANES]    Frequency of Communication with Friends and Family: More than three times a week    Frequency of Social Gatherings with Friends and Family: More than three times a week    Attends Religious Services: More than 4 times per year    Active Member of [employer] or Organizations: No    Attends Banker Meetings: Never    Marital Status: Widowed    Tobacco Counseling Counseling given: Not Answered Tobacco comments: when she was a teenager    Clinical Intake:  Pre-visit preparation completed: Yes  Pain : No/denies pain     BMI - recorded: 15.97 Nutritional Status: BMI <19  Underweight Nutritional Risks: Nausea/ vomitting/ diarrhea (no pain, gluten intolerant) Diabetes: No  How often do you need to have someone help you when you read instructions, pamphlets, or other written materials from your doctor or pharmacy?: 1 - Never  Interpreter Needed?: No  Information entered by :: R. Brandun Pinn LPN   Activities of Daily Living     02/08/2024    3:51 PM  In your present state of health, do you have any  difficulty performing the following activities:  Hearing? 0  Vision? 0  Comment readers  Difficulty concentrating or making decisions? 1  Walking or climbing stairs? 0  Dressing or bathing? 0  Doing errands, shopping? 1  Preparing Food and eating ? N  Using the Toilet? N  In the past six months, have you accidently leaked urine? Y  Do you have problems with loss of bowel control? N  Managing your Medications? Y  Managing your Finances? Y  Housekeeping or managing your Housekeeping? N    Patient Care Team: Dana Allan, MD as PCP - General (Family Medicine)  Indicate any recent Medical Services you may have received from other than [facility] providers in the past year (date may be approximate).     Assessment:   This is a routine wellness examination for G[place].  Hearing/Vision screen Hearing Screening - Comments:: No issues Vision Screening - Comments:: Readers, has an appt with eye doctor    Goals Addressed             This Visit's Progress    Patient Stated       Wants to get out and walk more when weather gets better and work in her flowers       Depression Screen     02/08/2024    4:08 PM 01/12/2024   11:28 AM 11/16/2023   11:08 AM 09/30/2023   11:26 AM 08/22/2023    3:01 PM  PHQ 2/9 Scores  PHQ - 2 Score 0 0 0 0 0  PHQ- 9 Score 2 0 4 1 4     Fall Risk     02/08/2024    3:57 PM 01/12/2024   11:28 AM 11/16/2023   11:08 AM 09/30/2023   11:26 AM 08/22/2023    3:00 PM  Fall Risk   Falls in the past year? 0 0 0 0 0  Number falls in past yr: 0 0 0 0 0  Injury with Fall? 0 0 0 0 0  Risk for fall due to :  No Fall Risks No Fall Risks No Fall Risks No Fall Risks No Fall Risks  Follow up Falls prevention discussed;Falls evaluation completed Falls evaluation completed Falls evaluation completed;Education provided Falls evaluation completed Falls evaluation completed    MEDICARE RISK AT HOME:  Medicare Risk at Home Any stairs in or around the home?: Yes If so, are  there any without handrails?: Yes Home free of loose throw rugs in walkways, pet beds, electrical cords, etc?: Yes Adequate lighting in your home to reduce risk of falls?: Yes Life alert?: No Use of a cane, walker or w/c?: No Grab bars in the bathroom?: No Shower chair or bench in shower?: No Elevated toilet seat or a handicapped toilet?: No  TIMED UP AND GO:  Was the test performed?  No  Cognitive Function: 6CIT completed        02/08/2024    4:18 PM  6CIT Screen  What Year? 0 points  What month? 3 points  What time? 0 points  Count back from 20 4 points  Months in reverse 4 points  Repeat phrase 10 points  Total Score 21 points    Immunizations Immunization History  Administered Date(s) Administered   Rabies, IM 07/25/2015, 07/28/2015, 08/01/2015, 08/08/2015   Tdap 07/25/2015    Screening Tests Health Maintenance  Topic Date Due   Zoster Vaccines- Shingrix (1 of 2) Never done   COVID-19 Vaccine (1 - 2024-25 season) Never done   INFLUENZA VACCINE  03/05/2024 (Originally 07/07/2023)   Pneumonia Vaccine 49+ Years old (1 of 1 - PCV) 10/01/2024 (Originally 04/21/2006)   DEXA SCAN  10/01/2024 (Originally 04/21/2006)   Medicare Annual Wellness (AWV)  02/07/2025   DTaP/Tdap/Td (2 - Td or Tdap) 07/24/2025   HPV VACCINES  Aged Out    Health Maintenance  Health Maintenance Due  Topic Date Due   Zoster Vaccines- Shingrix (1 of 2) Never done   COVID-19 Vaccine (1 - 2024-25 season) Never done   Health Maintenance Items Addressed: Patient declines vaccines. Patient declines Bone density.  Additional Screening:  Vision Screening: Recommended annual ophthalmology exams for early detection of glaucoma and other disorders of the eye. Has an upcoming appointment scheduled.  Dental Screening: Recommended annual dental exams for proper oral hygiene  Community Resource Referral / Chronic Care Management: CRR required this visit?  No   CCM required this visit?  No      Plan:     I have personally reviewed and noted the following in the patient's chart:   Medical and social history Use of alcohol, tobacco or illicit drugs  Current medications and supplements including opioid prescriptions. Patient is not currently taking opioid prescriptions. Functional ability and status Nutritional status Physical activity Advanced directives List of other physicians Hospitalizations, surgeries, and ER visits in previous 12 months Vitals Screenings to include cognitive, depression, and falls Referrals and appointments  In addition, I have reviewed and discussed with patient certain preventive protocols, quality metrics, and best practice recommendations. A written personalized care plan for preventive services as well as general preventive health recommendations were provided to patient.     Sydell Axon, LPN   12/11/1094   After Visit Summary: (MyChart) Due to this being a telephonic visit, the after visit summary with patients personalized plan was offered to patient via MyChart   Notes: Nothing significant to report at this time.

## 2024-02-14 DIAGNOSIS — Z961 Presence of intraocular lens: Secondary | ICD-10-CM | POA: Diagnosis not present

## 2024-02-14 DIAGNOSIS — H35373 Puckering of macula, bilateral: Secondary | ICD-10-CM | POA: Diagnosis not present

## 2024-02-14 DIAGNOSIS — H524 Presbyopia: Secondary | ICD-10-CM | POA: Diagnosis not present

## 2024-02-28 DIAGNOSIS — R42 Dizziness and giddiness: Secondary | ICD-10-CM | POA: Diagnosis not present

## 2024-02-28 DIAGNOSIS — G252 Other specified forms of tremor: Secondary | ICD-10-CM | POA: Diagnosis not present

## 2024-02-28 DIAGNOSIS — G243 Spasmodic torticollis: Secondary | ICD-10-CM | POA: Diagnosis not present

## 2024-02-28 DIAGNOSIS — Z8673 Personal history of transient ischemic attack (TIA), and cerebral infarction without residual deficits: Secondary | ICD-10-CM | POA: Diagnosis not present

## 2024-02-28 DIAGNOSIS — F01518 Vascular dementia, unspecified severity, with other behavioral disturbance: Secondary | ICD-10-CM | POA: Diagnosis not present

## 2024-04-01 ENCOUNTER — Encounter (HOSPITAL_COMMUNITY): Payer: Self-pay

## 2024-04-01 ENCOUNTER — Emergency Department (HOSPITAL_COMMUNITY)

## 2024-04-01 ENCOUNTER — Emergency Department (HOSPITAL_COMMUNITY)
Admission: EM | Admit: 2024-04-01 | Discharge: 2024-04-01 | Disposition: A | Discharge status: Home / Self Care | Attending: Emergency Medicine | Admitting: Emergency Medicine

## 2024-04-01 ENCOUNTER — Other Ambulatory Visit: Payer: Self-pay

## 2024-04-01 DIAGNOSIS — F0390 Unspecified dementia without behavioral disturbance: Secondary | ICD-10-CM | POA: Insufficient documentation

## 2024-04-01 DIAGNOSIS — R519 Headache, unspecified: Secondary | ICD-10-CM | POA: Insufficient documentation

## 2024-04-01 DIAGNOSIS — I700 Atherosclerosis of aorta: Secondary | ICD-10-CM | POA: Diagnosis not present

## 2024-04-01 DIAGNOSIS — I517 Cardiomegaly: Secondary | ICD-10-CM | POA: Diagnosis not present

## 2024-04-01 DIAGNOSIS — I6782 Cerebral ischemia: Secondary | ICD-10-CM | POA: Diagnosis not present

## 2024-04-01 DIAGNOSIS — R55 Syncope and collapse: Secondary | ICD-10-CM | POA: Diagnosis not present

## 2024-04-01 DIAGNOSIS — Z8673 Personal history of transient ischemic attack (TIA), and cerebral infarction without residual deficits: Secondary | ICD-10-CM | POA: Insufficient documentation

## 2024-04-01 DIAGNOSIS — G9389 Other specified disorders of brain: Secondary | ICD-10-CM | POA: Diagnosis not present

## 2024-04-01 DIAGNOSIS — R001 Bradycardia, unspecified: Secondary | ICD-10-CM | POA: Diagnosis not present

## 2024-04-01 LAB — CBC WITH DIFFERENTIAL/PLATELET
Abs Immature Granulocytes: 0.02 10*3/uL (ref 0.00–0.07)
Basophils Absolute: 0 10*3/uL (ref 0.0–0.1)
Basophils Relative: 0 %
Eosinophils Absolute: 0 10*3/uL (ref 0.0–0.5)
Eosinophils Relative: 1 %
HCT: 37.4 % (ref 36.0–46.0)
Hemoglobin: 12.5 g/dL (ref 12.0–15.0)
Immature Granulocytes: 0 %
Lymphocytes Relative: 24 %
Lymphs Abs: 1.3 10*3/uL (ref 0.7–4.0)
MCH: 32 pg (ref 26.0–34.0)
MCHC: 33.4 g/dL (ref 30.0–36.0)
MCV: 95.7 fL (ref 80.0–100.0)
Monocytes Absolute: 0.3 10*3/uL (ref 0.1–1.0)
Monocytes Relative: 6 %
Neutro Abs: 3.6 10*3/uL (ref 1.7–7.7)
Neutrophils Relative %: 69 %
Platelets: 162 10*3/uL (ref 150–400)
RBC: 3.91 MIL/uL (ref 3.87–5.11)
RDW: 13.5 % (ref 11.5–15.5)
WBC: 5.3 10*3/uL (ref 4.0–10.5)
nRBC: 0 % (ref 0.0–0.2)

## 2024-04-01 LAB — COMPREHENSIVE METABOLIC PANEL WITH GFR
ALT: 18 U/L (ref 0–44)
AST: 24 U/L (ref 15–41)
Albumin: 4 g/dL (ref 3.5–5.0)
Alkaline Phosphatase: 71 U/L (ref 38–126)
Anion gap: 9 (ref 5–15)
BUN: 19 mg/dL (ref 8–23)
CO2: 27 mmol/L (ref 22–32)
Calcium: 9.4 mg/dL (ref 8.9–10.3)
Chloride: 104 mmol/L (ref 98–111)
Creatinine, Ser: 1.07 mg/dL — ABNORMAL HIGH (ref 0.44–1.00)
GFR, Estimated: 52 mL/min — ABNORMAL LOW (ref >60)
Glucose, Bld: 99 mg/dL (ref 70–99)
Potassium: 4.3 mmol/L (ref 3.5–5.1)
Sodium: 140 mmol/L (ref 135–145)
Total Bilirubin: 1 mg/dL (ref 0.0–1.2)
Total Protein: 6.6 g/dL (ref 6.5–8.1)

## 2024-04-01 LAB — URINALYSIS, ROUTINE W REFLEX MICROSCOPIC
Bilirubin Urine: NEGATIVE
Glucose, UA: NEGATIVE mg/dL
Hgb urine dipstick: NEGATIVE
Ketones, ur: NEGATIVE mg/dL
Leukocytes,Ua: NEGATIVE
Nitrite: NEGATIVE
Protein, ur: NEGATIVE mg/dL
Specific Gravity, Urine: 1.006 (ref 1.005–1.030)
pH: 7 (ref 5.0–8.0)

## 2024-04-01 LAB — MAGNESIUM: Magnesium: 2.2 mg/dL (ref 1.7–2.4)

## 2024-04-01 LAB — CBG MONITORING, ED: Glucose-Capillary: 80 mg/dL (ref 70–99)

## 2024-04-01 LAB — TROPONIN I (HIGH SENSITIVITY)
Troponin I (High Sensitivity): 7 ng/L (ref <18)
Troponin I (High Sensitivity): 9 ng/L (ref <18)

## 2024-04-01 MED ORDER — ACETAMINOPHEN 325 MG PO TABS
650.0000 mg | ORAL_TABLET | Freq: Once | ORAL | Status: AC
  Administered 2024-04-01: 650 mg via ORAL
  Filled 2024-04-01: qty 2

## 2024-04-01 MED ORDER — LACTATED RINGERS IV BOLUS
500.0000 mL | Freq: Once | INTRAVENOUS | Status: AC
  Administered 2024-04-01: 500 mL via INTRAVENOUS

## 2024-04-01 NOTE — Discharge Instructions (Signed)
 You were seen in the Emergency Department for an episode of near fainting Your blood work EKG and CAT scan looked okay There were some age-related changes on the CAT scan that you will follow-up with your primary care doctor for Return to the emergency department for episodes of fainting or near fainting, headaches or any other concerns Otherwise follow-up with your primary care doctor 1 week for reevaluation

## 2024-04-01 NOTE — ED Provider Notes (Signed)
 [place] EMERGENCY DEPARTMENT AT [facility] Provider Note   CSN: [id number] Arrival date & time: 04/01/24  1304     History  Chief Complaint  Patient presents with   head pressure    Suzanne Leon is a 83 y.o. female.  HPI Patient presents for episode of head pressure and near syncope.  This occurred while at church, standing up and singing.  Her medical history includes cervical dystonia with tremors, CVA, dementia.  She has recently been seen by neurology for dizziness.  Previously, she felt that this may have been related to famotidine  use.  It did improve after discontinuation of this medication.  She has cervical dystonia with dystonic tremors at baseline.  She states that she does take a medication prior to going into public, including church, to minimize her tremors.  She feels that this may have been related to her episode that occurred prior to arrival.  Per chart review, she has prescribed as needed propranolol .  Currently, patient endorses mild headache.  She denies any other areas of new pain.  She does have chronic arthritis.  History per son: Patient seemed well this morning.  He confirms that she did take her propranolol  prior to going to church.  While standing up, she sat back down.  She took off her jacket suggesting that she felt hot.  He states that this is not normal for her, as she typically feels cold.  She did appear clammy.  She denies consciousness.    Home Medications Prior to Admission medications   Medication Sig Start Date End Date Taking? Authorizing Provider  acetaminophen  (TYLENOL ) 650 MG CR tablet Take 1 tablet (650 mg total) by mouth every 8 (eight) hours as needed for pain. 10/02/23   Valli Gaw, MD  amLODipine  (NORVASC ) 2.5 MG tablet Take 1 tablet (2.5 mg total) by mouth every evening. 11/11/23   Valli Gaw, MD  cholecalciferol (VITAMIN D3) 10 MCG/ML LIQD oral liquid Take 400 Units by mouth daily. Takes 8 drops daily  800  international unit daily    [provider]  meclizine  (ANTIVERT ) 12.5 MG tablet Take 1 tablet (12.5 mg total) by mouth 3 (three) times daily as needed for dizziness. 11/13/23   Curatolo, Adam, DO  meloxicam  (MOBIC ) 7.5 MG tablet Take 1 tablet (7.5 mg total) by mouth daily. 02/09/24   Valli Gaw, MD  pantoprazole  (PROTONIX ) 40 MG tablet Take 1 tablet (40 mg total) by mouth daily. 11/16/23 02/14/24  Brigitte Canard, PA-C  polyethylene glycol powder (GLYCOLAX /MIRALAX ) 17 GM/SCOOP powder Take 17 g by mouth daily. 10/10/23   Brigitte Canard, PA-C  propranolol  (INDERAL ) 10 MG tablet Take 1 tablet (10 mg total) by mouth daily as needed. 02/09/24   Valli Gaw, MD  SV MAGNESIUM PO Take 249 mg by mouth daily. Patient not taking: Reported on 02/08/2024    [provider]  Vitamin D , Ergocalciferol , (DRISDOL ) 1.25 MG (50000 UNIT) CAPS capsule Take 1 capsule (50,000 Units total) by mouth every 7 (seven) days. 09/30/23   Valli Gaw, MD      Allergies    Crestor [rosuvastatin], Fosamax [alendronate], Mysoline [primidone], Pepcid  [famotidine ], Prolia  [denosumab ], Prozac [fluoxetine], Sinemet [carbidopa-levodopa], Vitamin d  analogs, and Zocor [simvastatin]    Review of Systems   Review of Systems  Neurological:  Positive for light-headedness and headaches.  All other systems reviewed and are negative.   Physical Exam Updated Vital Signs BP 135/85   Pulse (!) 49   Temp 97.6 F (36.4 C) (  Oral)   Resp 16   SpO2 100%  Physical Exam Vitals and nursing note reviewed.  Constitutional:      General: She is not in acute distress.    Appearance: Normal appearance. She is well-developed. She is not ill-appearing, toxic-appearing or diaphoretic.  HENT:     Head: Normocephalic and atraumatic.     Right Ear: External ear normal.     Left Ear: External ear normal.     Nose: Nose normal.     Mouth/Throat:     Mouth: Mucous membranes are moist.  Eyes:     Extraocular Movements: Extraocular  movements intact.     Conjunctiva/sclera: Conjunctivae normal.  Cardiovascular:     Rate and Rhythm: Regular rhythm. Bradycardia present.     Heart sounds: No murmur heard. Pulmonary:     Effort: Pulmonary effort is normal. No respiratory distress.     Breath sounds: Normal breath sounds. No wheezing or rales.  Chest:     Chest wall: No tenderness.  Abdominal:     General: There is no distension.     Palpations: Abdomen is soft.     Tenderness: There is no abdominal tenderness.  Musculoskeletal:        General: No swelling. Normal range of motion.     Cervical back: Normal range of motion and neck supple.  Skin:    General: Skin is warm and dry.     Coloration: Skin is not jaundiced or pale.  Neurological:     General: No focal deficit present.     Mental Status: She is alert.     Cranial Nerves: No facial asymmetry.     Sensory: Sensation is intact. No sensory deficit.     Motor: Tremor (baseline) present. No weakness, abnormal muscle tone or pronator drift.     Coordination: Finger-Nose-Finger Test normal.  Psychiatric:        Mood and Affect: Mood normal.        Behavior: Behavior normal.     ED Results / Procedures / Treatments   Labs (all labs ordered are listed, but only abnormal results are displayed) Labs Reviewed  CBC WITH DIFFERENTIAL/PLATELET  COMPREHENSIVE METABOLIC PANEL WITH GFR  URINALYSIS, ROUTINE W REFLEX MICROSCOPIC  MAGNESIUM  CBG MONITORING, ED  TROPONIN I (HIGH SENSITIVITY)    EKG EKG Interpretation Date/Time:  Sunday April 01 2024 13:42:24 EDT Ventricular Rate:  49 PR Interval:  152 QRS Duration:  93 QT Interval:  519 QTC Calculation: 469 R Axis:   -51  Text Interpretation: Sinus bradycardia Left anterior fascicular block Confirmed by Iva Mariner ([id number]) on 04/01/2024 2:24:02 PM  Radiology No results found.  Procedures Procedures    Medications Ordered in ED Medications  lactated ringers bolus 500 mL (500 mLs Intravenous New  Bag/Given 04/01/24 1400)  acetaminophen  (TYLENOL ) tablet 650 mg (650 mg Oral Given 04/01/24 1400)    ED Course/ Medical Decision Making/ A&P                                 Medical Decision Making Amount and/or Complexity of Data Reviewed Labs: ordered. Radiology: ordered.  Risk OTC drugs.   This patient presents to the ED for concern of near syncope, this involves an extensive number of treatment options, and is a complaint that carries with it a high risk of complications and morbidity.  The differential diagnosis includes vasovagal episode, polypharmacy, arrhythmia, anemia, electrolyte disturbances  Co morbidities that complicate the patient evaluation  cervical dystonia with tremors, CVA, dementia   Additional history obtained:  Additional history obtained from EMS, patient's son External records from outside source obtained and reviewed including EMR   Lab Tests:  I Ordered, and personally interpreted labs.  The pertinent results include: Normal hemoglobin, no leukocytosis, remaining lab work pending at time of signout.   Imaging Studies ordered:  I ordered imaging studies including chest x-ray, CT head I independently visualized and interpreted imaging which showed (pending at time of signout) I agree with the radiologist interpretation   Cardiac Monitoring: / EKG:  The patient was maintained on a cardiac monitor.  I personally viewed and interpreted the cardiac monitored which showed an underlying rhythm of: Sinus rhythm   Problem List / ED Course / Critical interventions / Medication management  Patient presenting for what was described by EMS as a near syncopal episode while standing at church.  She states that she does take a medication to minimize her tremors when she goes out to church.  Per chart review, it does appear that this is propranolol .  On arrival in the ED, she is awake and alert.  Her vital signs are notable for bradycardia.  It does seem that  her baseline heart rate is in the 50s.  Currently, it is in the 40s.  This is likely effects from propranolol  and may have contributed to her episode today.  She does endorse a mild generalized headache at this time.  Tylenol  was ordered.  Will give IV fluids and keep on monitor.  Workup was initiated.  CBC was reassuring.  Remaining lab work and imaging are pending at time of signout.  Care of patient was signed to oncoming ED provider. I ordered medication including IV fluids for hydration; Tylenol  for headache Reevaluation of the patient after these medicines showed that the patient improved I have reviewed the patients home medicines and have made adjustments as needed  Social Determinants of Health:  Lives at home with son        Final Clinical Impression(s) / ED Diagnoses Final diagnoses:  Near syncope    Rx / DC Orders ED Discharge Orders     None         Iva Mariner, MD 04/01/24 1457

## 2024-04-01 NOTE — ED Notes (Signed)
 Pt ambulated without assistance. O2 stayed between 96-100

## 2024-04-01 NOTE — ED Provider Notes (Signed)
  Physical Exam  BP (!) 146/84   Pulse (!) 55   Temp 97.7 F (36.5 C) (Oral)   Resp 17   SpO2 100%   Physical Exam Vitals and nursing note reviewed.  HENT:     Head: Normocephalic and atraumatic.  Eyes:     Pupils: Pupils are equal, round, and reactive to light.  Cardiovascular:     Rate and Rhythm: Normal rate and regular rhythm.  Pulmonary:     Effort: Pulmonary effort is normal.     Breath sounds: Normal breath sounds.  Abdominal:     Palpations: Abdomen is soft.     Tenderness: There is no abdominal tenderness.  Skin:    General: Skin is warm and dry.  Neurological:     Mental Status: She is alert.  Psychiatric:        Mood and Affect: Mood normal.     Procedures  Procedures  ED Course / MDM   Clinical Course as of 04/01/24 1851  Sun Apr 01, 2024  1850  CT head shows no acute findings but redemonstrates asymmetric volume loss and atrophic changes.  Patient feels well without recurrent episodes of near syncope.  No headaches.  She was able to ambulate to the bathroom and is requesting discharge home.  She will follow-up with her PCP. [MP]    Clinical Course User Index [MP] Sallyanne Creamer, DO   Medical Decision Making I, Rafael Bun DO, have assumed care of this patient from the previous provider pending CT head, ambulatory trial, reevaluation and disposition  Amount and/or Complexity of Data Reviewed Labs: ordered. Radiology: ordered.  Risk OTC drugs.          Sallyanne Creamer, DO 04/01/24 1851

## 2024-04-01 NOTE — ED Notes (Signed)
 Nt called CCMD to put pt on monitor

## 2024-04-01 NOTE — ED Triage Notes (Signed)
 Pt BIB [facility] from home d/t head pressure that started after church today. Hx vertigo. Tremors at baseline. A&O X4. Vss.

## 2024-04-04 ENCOUNTER — Ambulatory Visit: Payer: Self-pay

## 2024-04-04 NOTE — Telephone Encounter (Signed)
 Spoke to pt's son. Stated pt was currently resting. Son took pt's vitals while on the phone was 117/56 pulse was 70. Informed son of recommendations and stated to take pt to er if heart rate become low of 50. Son in agreement with plan.

## 2024-04-04 NOTE — Telephone Encounter (Signed)
 Chief Complaint: Weakness, Feeling Faint Symptoms: see above Frequency: 1st occurrence Sunday, second occurrence today Pertinent Negatives: s/s of stroke Disposition: [] ED /[] Urgent Care (no appt availability in office) / [x] Appointment(In office/virtual)/ []  [facility]/ [] Home Care/ [] Refused Recommended Disposition /[] [facility]/ []  Follow-up with PCP Additional Notes: Patient's son called in, with patient at side, stating that patient had second occurrence of an incident that started on Sunday. Patient was weak and felt faint and sweating. On Sunday at church when this occurred, EMS was called and evaluated patient, stating her blood sugar and blood pressure were normal but they transported her to hospital for further evaluation. Patient notes show diagnosis as vagal with bradycardia and electrolyte imbalance. Patient's son states that today this occurred again but patient was actually able to walk into house and sit down, and seems okay right now. Patient does not have s/s of stroke with any numbness, weakness, difficulty with speech. Patient does have hx of stroke. Patient appt made for further evaluation. Educated patient and patient's son to encourage hydration with electrolytes, rest, lying flat when feeling faint, and to go to nearest ER or call 911 if patient faints or has s/s of stroke.   Reason for Disposition  [1] MODERATE weakness (i.e., interferes with work, school, normal activities) AND [2] persists > 3 days  Answer Assessment - Initial Assessment Questions 1. DESCRIPTION: "Describe how you are feeling."     Near faint, weak 2. SEVERITY: "How bad is it?"  "Can you stand and walk?"   - MILD (0-3): Feels weak or tired, but does not interfere with work, school or normal activities.   - MODERATE (4-7): Able to stand and walk; weakness interferes with work, school, or normal activities.   - SEVERE (8-10): Unable to stand or walk; unable to do usual activities.      Weakness/Faint episode 3. ONSET: "When did these symptoms begin?" (e.g., hours, days, weeks, months)     Sunday 4. CAUSE: "What do you think is causing the weakness or fatigue?" (e.g., not drinking enough fluids, medical problem, trouble sleeping)     Uncertain 5. NEW MEDICINES:  "Have you started on any new medicines recently?" (e.g., opioid pain medicines, benzodiazepines, muscle relaxants, antidepressants, antihistamines, neuroleptics, beta blockers)     No 6. OTHER SYMPTOMS: "Do you have any other symptoms?" (e.g., chest pain, fever, cough, SOB, vomiting, diarrhea, bleeding, other areas of pain)     No  Protocols used: Weakness (Generalized) and Fatigue-A-AH

## 2024-04-05 ENCOUNTER — Ambulatory Visit (INDEPENDENT_AMBULATORY_CARE_PROVIDER_SITE_OTHER): Admitting: Family Medicine

## 2024-04-05 ENCOUNTER — Encounter: Payer: Self-pay | Admitting: Family Medicine

## 2024-04-05 VITALS — BP 118/62 | HR 65 | Temp 97.9°F | Resp 20 | Ht 68.0 in | Wt 104.5 lb

## 2024-04-05 DIAGNOSIS — Z8739 Personal history of other diseases of the musculoskeletal system and connective tissue: Secondary | ICD-10-CM | POA: Diagnosis not present

## 2024-04-05 DIAGNOSIS — E559 Vitamin D deficiency, unspecified: Secondary | ICD-10-CM | POA: Diagnosis not present

## 2024-04-05 DIAGNOSIS — I10 Essential (primary) hypertension: Secondary | ICD-10-CM

## 2024-04-05 DIAGNOSIS — R42 Dizziness and giddiness: Secondary | ICD-10-CM

## 2024-04-05 DIAGNOSIS — G250 Essential tremor: Secondary | ICD-10-CM

## 2024-04-05 LAB — COMPREHENSIVE METABOLIC PANEL WITH GFR
ALT: 13 U/L (ref 0–35)
AST: 20 U/L (ref 0–37)
Albumin: 4.3 g/dL (ref 3.5–5.2)
Alkaline Phosphatase: 84 U/L (ref 39–117)
BUN: 25 mg/dL — ABNORMAL HIGH (ref 6–23)
CO2: 30 meq/L (ref 19–32)
Calcium: 9.4 mg/dL (ref 8.4–10.5)
Chloride: 103 meq/L (ref 96–112)
Creatinine, Ser: 1.02 mg/dL (ref 0.40–1.20)
GFR: 51.07 mL/min — ABNORMAL LOW (ref >60.00)
Glucose, Bld: 91 mg/dL (ref 70–99)
Potassium: 4.6 meq/L (ref 3.5–5.1)
Sodium: 140 meq/L (ref 135–145)
Total Bilirubin: 0.6 mg/dL (ref 0.2–1.2)
Total Protein: 6.6 g/dL (ref 6.0–8.3)

## 2024-04-05 LAB — VITAMIN D 25 HYDROXY (VIT D DEFICIENCY, FRACTURES): VITD: 22.29 ng/mL — ABNORMAL LOW (ref 30.00–100.00)

## 2024-04-05 MED ORDER — ACETAMINOPHEN 500 MG PO TABS: 500.0000 mg | ORAL_TABLET | Freq: Two times a day (BID) | ORAL | 3 refills | Status: AC | PRN

## 2024-04-05 NOTE — Patient Instructions (Addendum)
 It was a pleasure meeting you today. Thank you for allowing me to take part in your health care.  Our goals for today as we discussed include:  Stop Amlodipine  Monitor blood pressure at home If blood pressure consistently greater than 150/90 can restart Amlodipine  2.5 mg  If blood pressure <90/60 and symptomatic, go to emergency department   Stop Propranolol  Stop Meloxicam  Stop Meclizine   Continue Pantoprazole  40 mg daily  We will get some labs today.  If they are abnormal or we need to do something about them, I will call you.  If they are normal, I will send you a message on MyChart (if it is active) or a letter in the mail.  If you don't hear from us  in 2 weeks, please call the office at the number below.   Can give Tylenol  500 mg at night.  Can increase to two times a day if needed.  Schedule nurse clinic next week for blood pressure check.  Please bring in blood pressure monitor to have checked as well.     This is a list of the screening recommended for you and due dates:  Health Maintenance  Topic Date Due   Zoster (Shingles) Vaccine (1 of 2) Never done   COVID-19 Vaccine (1 - 2024-25 season) Never done   Pneumonia Vaccine (1 of 1 - PCV) 10/01/2024*   DEXA scan (bone density measurement)  10/01/2024*   Flu Shot  07/06/2024   Medicare Annual Wellness Visit  02/07/2025   DTaP/Tdap/Td vaccine (2 - Td or Tdap) 07/24/2025   HPV Vaccine  Aged Out   Meningitis B Vaccine  Aged Out  *Topic was postponed. The date shown is not the original due date.      If you have any questions or concerns, please do not hesitate to call the office at [phone].  I look forward to our next visit and until then take care and stay safe.  Regards,   Valli Gaw, MD   [facility]

## 2024-04-05 NOTE — Progress Notes (Signed)
 SUBJECTIVE:   Chief Complaint  Patient presents with   Hospitalization Follow-up   HPI Presents to clinic for recent hospital follow up  Discussed the use of AI scribe software for clinical note transcription with the patient, who gave verbal consent to proceed.  History of Present Illness Suzanne Leon is an 83 year old female with essential tremor and cerebral dystonia who presents for follow-up after a hospital episode. She is accompanied by her caregiver, who is also her daughter.  She is following up after a recent hospital episode where she was evaluated with a CT scan, given fluids, and discharged. She has a history of essential tremor and cerebral dystonia. She was previously on propranolol  for tremors, which she took three times a week, but there is concern it may be contributing to syncopal episodes. She experienced a syncopal episode on a Sunday after taking propranolol , with symptoms starting around 11:30 AM after taking the medication at 8:30 AM. Her heart rate was noted to be low, around 55, and her blood pressure was also low at times, such as 98/59.  She has not been taking meloxicam  since the last refill due to concerns about kidney function, as her creatinine levels were elevated. She averages 35 ounces of water daily and sometimes drinks milk, fruit juice, or sports drinks. She has been using Aleve PM for aches and pains, which helped her sleep, but was advised against it due to kidney concerns.  She experiences generalized soreness and pain 'from here on down' and takes Tylenol  nightly for relief. Her caregiver reports that her father takes three Tylenol  a day for arthritis and back pain, but her pain is less consistent.  She is currently on amlodipine  for blood pressure management. Her caregiver monitors her blood pressure regularly, and it has been noted to be low, such as 118/62 without propranolol . She has a history of a stroke and experiences occasional headaches,  which were present during a recent episode at church where she felt hot and sweaty, nearly fainting but not losing consciousness.  She has been taking pantoprazole  (Protonix ) for gastrointestinal issues, which she continues to use. She had a bad reaction to famotidine  in December, which caused significant dizziness and required hospitalization. She no longer takes famotidine .    PERTINENT PMH / PSH: As above  OBJECTIVE:  BP 118/62   Pulse 65   Temp 97.9 F (36.6 C)   Resp 20   Ht 5\' 8"  (1.727 m)   Wt 104 lb 8 oz (47.4 kg)   SpO2 99%   BMI 15.89 kg/m    Physical Exam Vitals reviewed.  Constitutional:      General: She is not in acute distress.    Appearance: She is not ill-appearing.  HENT:     Head: Normocephalic.     Nose: Nose normal.  Eyes:     Conjunctiva/sclera: Conjunctivae normal.  Cardiovascular:     Rate and Rhythm: Normal rate and regular rhythm.     Pulses: Normal pulses.     Heart sounds: Normal heart sounds.  Pulmonary:     Effort: Pulmonary effort is normal.     Breath sounds: Normal breath sounds.  Abdominal:     General: Bowel sounds are normal.     Palpations: Abdomen is soft.  Musculoskeletal:        General: Normal range of motion.     Cervical back: Normal range of motion.  Neurological:     Mental Status: She is alert and oriented to  person, place, and time. Mental status is at baseline.     Motor: No weakness.  Psychiatric:        Mood and Affect: Mood normal.           04/05/2024    9:33 AM 02/08/2024    4:08 PM 01/12/2024   11:28 AM 11/16/2023   11:08 AM 09/30/2023   11:26 AM  Depression screen PHQ 2/9  Decreased Interest 0 0 0 0 0  Down, Depressed, Hopeless 0 0 0 0 0  PHQ - 2 Score 0 0 0 0 0  Altered sleeping 1 1 0 3 1  Tired, decreased energy 1 1 0 1 0  Change in appetite 0 0 0 0 0  Feeling bad or failure about yourself  0 0 0 0 0  Trouble concentrating 0 0 0 0 0  Moving slowly or fidgety/restless 0 0 0 0 0  Suicidal thoughts 0  0 0 0 0  PHQ-9 Score 2 2 0 4 1  Difficult doing work/chores Not difficult at all Not difficult at all Not difficult at all Somewhat difficult Not difficult at all      04/05/2024    9:33 AM 01/12/2024   11:28 AM 11/16/2023   11:08 AM 09/30/2023   11:26 AM  GAD 7 : Generalized Anxiety Score  Nervous, Anxious, on Edge 0 0 0 0  Control/stop worrying 0 0 0 0  Worry too much - different things 0 0 0 0  Trouble relaxing 0 0 0 0  Restless 0 0 0 0  Easily annoyed or irritable 0 0 0 0  Afraid - awful might happen 0 0 0 0  Total GAD 7 Score 0 0 0 0  Anxiety Difficulty Not difficult at all Not difficult at all Not difficult at all Not difficult at all    ASSESSMENT/PLAN:  Dizziness Assessment & Plan: Recent syncopal episode likely related to propranolol  use, with concerns about hypotension and bradycardia. Recent CT head scan normal, ruling out acute stroke. Monitor for new neurological symptoms. No focal deficits on exam today - Discontinue propranolol . - Stop amlodipine , monitor blood pressure over the weekend. - If BP >140/90 mmHg, resume amlodipine  2.5 mg. - Schedule follow-up next week for BP check.   Essential hypertension Assessment & Plan: Soft blood pressures.   Stop Amlodipine  Stop Propranolol  Follow up in 1 week RN clinic Monitor BP at home.  Goal <150/90 Can restart medication if BP increasing  Orders: -     Comprehensive metabolic panel with GFR  History of arthritis -     Acetaminophen ; Take 1 tablet (500 mg total) by mouth 2 (two) times daily as needed.  Dispense: 90 tablet; Refill: 3  Essential tremor Assessment & Plan: Takes Propranolol  three times week  has not noticed any improvement. Cerebral dystonia noted by neurologist with no current treatment recommended. - Monitor tremor symptoms. - Follow up with neurologist if tremors worsen. - Discontinue Propranolol  secondary to low BP and dzziness   Vitamin D  deficiency -     VITAMIN D  25 Hydroxy (Vit-D  Deficiency, Fractures)    PDMP reviewed  Return in about 1 week (around 04/12/2024) for RN clinic, BP with home monitor.  Valli Gaw, MD

## 2024-04-09 ENCOUNTER — Telehealth: Payer: Self-pay | Admitting: Physician Assistant

## 2024-04-09 NOTE — Telephone Encounter (Signed)
 The patient called requesting assistance for his mother, as her current medication (Protonix  40 mg) is not effective. He also sent a message to Mrs. Blease Burden nurse via MyChart. The nurse informed him that his mother will need a follow-up, as she was last seen by Mrs. Marcille Severance. An appointment was scheduled with Mrs. Marcille Severance on 05/07/2024 at 10:20 AM.

## 2024-04-11 ENCOUNTER — Other Ambulatory Visit: Payer: Self-pay

## 2024-04-11 MED ORDER — PANTOPRAZOLE SODIUM 40 MG PO TBEC: 40.0000 mg | DELAYED_RELEASE_TABLET | Freq: Every day | ORAL | 3 refills | Status: DC

## 2024-04-11 MED ORDER — DEXLANSOPRAZOLE 60 MG PO CPDR: 60.0000 mg | DELAYED_RELEASE_CAPSULE | Freq: Every day | ORAL | 3 refills | Status: DC

## 2024-04-11 NOTE — Progress Notes (Signed)
 Suzanne Leon

## 2024-04-13 ENCOUNTER — Encounter: Payer: Self-pay | Admitting: Family Medicine

## 2024-04-13 ENCOUNTER — Other Ambulatory Visit: Payer: Self-pay | Admitting: Family Medicine

## 2024-04-13 ENCOUNTER — Telehealth: Payer: Self-pay

## 2024-04-13 ENCOUNTER — Ambulatory Visit

## 2024-04-13 DIAGNOSIS — I10 Essential (primary) hypertension: Secondary | ICD-10-CM

## 2024-04-13 MED ORDER — AMLODIPINE BESYLATE 2.5 MG PO TABS: 2.5000 mg | ORAL_TABLET | Freq: Every evening | ORAL | 3 refills | Status: DC

## 2024-04-13 NOTE — Progress Notes (Signed)
 BP 142/85 and 167/80 in nurse clinic today  Restart Amlodipine  2.5 mg at night Continue to monitor blood pressure.  Goal <140/90  Suzanne Gaw, MD

## 2024-04-13 NOTE — Assessment & Plan Note (Addendum)
 Takes Propranolol  three times week  has not noticed any improvement. Cerebral dystonia noted by neurologist with no current treatment recommended. - Monitor tremor symptoms. - Follow up with neurologist if tremors worsen. - Discontinue Propranolol  secondary to low BP and dzziness

## 2024-04-13 NOTE — Assessment & Plan Note (Signed)
 Soft blood pressures.   Stop Amlodipine  Stop Propranolol  Follow up in 1 week RN clinic Monitor BP at home.  Goal <150/90 Can restart medication if BP increasing

## 2024-04-13 NOTE — Telephone Encounter (Signed)
 Pt son wanted to inquire also if there is anything you can recommend to help to possibly control Mom's head from shaking the way it does, she is a little embarrassed.

## 2024-04-13 NOTE — Progress Notes (Signed)
 Patient here for nurse visit BP check per order from Dr Sueanne Emerald.   Patient reports compliance with prescribed BP medications: yes,    Last dose of BP medication: na  BP Readings from Last 3 Encounters:  04/13/24 (!) 167/80  04/05/24 118/62  04/01/24 (!) 146/84   Pulse Readings from Last 3 Encounters:  04/13/24 84  04/05/24 65  04/01/24 (!) 55    Per Dr.Walsh she is starting pt back on Amlodopine 2.5 mg and continue to monitor bp at home  Patient verbalized understanding of instructions.

## 2024-04-13 NOTE — Assessment & Plan Note (Addendum)
 Recent syncopal episode likely related to propranolol  use, with concerns about hypotension and bradycardia. Recent CT head scan normal, ruling out acute stroke. Monitor for new neurological symptoms. No focal deficits on exam today - Discontinue propranolol . - Stop amlodipine , monitor blood pressure over the weekend. - If BP >140/90 mmHg, resume amlodipine  2.5 mg. - Schedule follow-up next week for BP check.

## 2024-04-16 NOTE — Telephone Encounter (Signed)
 Called and spoke to pt son who is on DPR and advised him to call her neurologist.

## 2024-05-07 ENCOUNTER — Ambulatory Visit: Admitting: Physician Assistant

## 2024-06-11 ENCOUNTER — Telehealth: Payer: Self-pay

## 2024-06-11 MED ORDER — FLUTICASONE PROPIONATE 50 MCG/ACT NA SUSP: 2.0000 | Freq: Every day | NASAL | 0 refills | Status: DC

## 2024-06-11 NOTE — Telephone Encounter (Signed)
 Copied from CRM [id number]. Topic: Clinical - Medication Question >> Jun 11, 2024  1:52 PM Deaijah H wrote: Reason for CRM: Patient's son called in to have nasal spray refilled. No located on the chart. Please call [phone] if needed

## 2024-06-11 NOTE — Telephone Encounter (Signed)
 Reviewed Dr. Cottie note which states patient was treated with nasal corticosteroid by ENT in the past. Please reach out to the patient to let her know I have sent her nasal Flonase , use 1 puff daily till she follows up with her ENT on need, duration of Nasal Flonase  for this.     Luke Shade, MD

## 2024-06-11 NOTE — Addendum Note (Signed)
 Addended by: Aadam Zhen on: 06/11/2024 04:15 PM   Modules accepted: Orders

## 2024-06-13 ENCOUNTER — Other Ambulatory Visit: Payer: Self-pay

## 2024-06-13 DIAGNOSIS — J029 Acute pharyngitis, unspecified: Secondary | ICD-10-CM

## 2024-06-13 NOTE — Telephone Encounter (Signed)
 New referral sent to ENT has pt previous has left the practice.

## 2024-07-09 DIAGNOSIS — L570 Actinic keratosis: Secondary | ICD-10-CM | POA: Diagnosis not present

## 2024-07-18 ENCOUNTER — Emergency Department (HOSPITAL_COMMUNITY)
Admission: EM | Admit: 2024-07-18 | Discharge: 2024-07-18 | Disposition: A | Discharge status: Home / Self Care | Attending: Emergency Medicine | Admitting: Emergency Medicine

## 2024-07-18 ENCOUNTER — Other Ambulatory Visit: Payer: Self-pay

## 2024-07-18 ENCOUNTER — Emergency Department (HOSPITAL_COMMUNITY)

## 2024-07-18 ENCOUNTER — Ambulatory Visit: Payer: Self-pay

## 2024-07-18 ENCOUNTER — Encounter (HOSPITAL_COMMUNITY): Payer: Self-pay

## 2024-07-18 DIAGNOSIS — S0083XA Contusion of other part of head, initial encounter: Secondary | ICD-10-CM | POA: Diagnosis not present

## 2024-07-18 DIAGNOSIS — I10 Essential (primary) hypertension: Secondary | ICD-10-CM | POA: Insufficient documentation

## 2024-07-18 DIAGNOSIS — R9082 White matter disease, unspecified: Secondary | ICD-10-CM | POA: Diagnosis not present

## 2024-07-18 DIAGNOSIS — Z8673 Personal history of transient ischemic attack (TIA), and cerebral infarction without residual deficits: Secondary | ICD-10-CM | POA: Insufficient documentation

## 2024-07-18 DIAGNOSIS — R9089 Other abnormal findings on diagnostic imaging of central nervous system: Secondary | ICD-10-CM | POA: Diagnosis not present

## 2024-07-18 DIAGNOSIS — G9389 Other specified disorders of brain: Secondary | ICD-10-CM | POA: Diagnosis not present

## 2024-07-18 DIAGNOSIS — S0990XA Unspecified injury of head, initial encounter: Secondary | ICD-10-CM

## 2024-07-18 DIAGNOSIS — Z79899 Other long term (current) drug therapy: Secondary | ICD-10-CM | POA: Insufficient documentation

## 2024-07-18 DIAGNOSIS — H539 Unspecified visual disturbance: Secondary | ICD-10-CM

## 2024-07-18 DIAGNOSIS — W2209XA Striking against other stationary object, initial encounter: Secondary | ICD-10-CM | POA: Insufficient documentation

## 2024-07-18 DIAGNOSIS — Z85828 Personal history of other malignant neoplasm of skin: Secondary | ICD-10-CM | POA: Insufficient documentation

## 2024-07-18 DIAGNOSIS — H547 Unspecified visual loss: Secondary | ICD-10-CM | POA: Insufficient documentation

## 2024-07-18 DIAGNOSIS — K219 Gastro-esophageal reflux disease without esophagitis: Secondary | ICD-10-CM | POA: Diagnosis not present

## 2024-07-18 DIAGNOSIS — R1310 Dysphagia, unspecified: Secondary | ICD-10-CM | POA: Diagnosis not present

## 2024-07-18 DIAGNOSIS — Z87891 Personal history of nicotine dependence: Secondary | ICD-10-CM | POA: Insufficient documentation

## 2024-07-18 DIAGNOSIS — I639 Cerebral infarction, unspecified: Secondary | ICD-10-CM | POA: Diagnosis not present

## 2024-07-18 DIAGNOSIS — Z471 Aftercare following joint replacement surgery: Secondary | ICD-10-CM | POA: Diagnosis not present

## 2024-07-18 DIAGNOSIS — R29818 Other symptoms and signs involving the nervous system: Secondary | ICD-10-CM | POA: Diagnosis not present

## 2024-07-18 DIAGNOSIS — R519 Headache, unspecified: Secondary | ICD-10-CM | POA: Diagnosis present

## 2024-07-18 LAB — DIFFERENTIAL
Abs Immature Granulocytes: 0.02 K/uL (ref 0.00–0.07)
Basophils Absolute: 0 K/uL (ref 0.0–0.1)
Basophils Relative: 0 %
Eosinophils Absolute: 0 K/uL (ref 0.0–0.5)
Eosinophils Relative: 0 %
Immature Granulocytes: 0 %
Lymphocytes Relative: 32 %
Lymphs Abs: 1.7 K/uL (ref 0.7–4.0)
Monocytes Absolute: 0.3 K/uL (ref 0.1–1.0)
Monocytes Relative: 6 %
Neutro Abs: 3.2 K/uL (ref 1.7–7.7)
Neutrophils Relative %: 62 %

## 2024-07-18 LAB — I-STAT CHEM 8, ED
BUN: 20 mg/dL (ref 8–23)
Calcium, Ion: 1.2 mmol/L (ref 1.15–1.40)
Chloride: 103 mmol/L (ref 98–111)
Creatinine, Ser: 1.1 mg/dL — ABNORMAL HIGH (ref 0.44–1.00)
Glucose, Bld: 141 mg/dL — ABNORMAL HIGH (ref 70–99)
HCT: 39 % (ref 36.0–46.0)
Hemoglobin: 13.3 g/dL (ref 12.0–15.0)
Potassium: 4.8 mmol/L (ref 3.5–5.1)
Sodium: 140 mmol/L (ref 135–145)
TCO2: 26 mmol/L (ref 22–32)

## 2024-07-18 LAB — RAPID URINE DRUG SCREEN, HOSP PERFORMED
Amphetamines: NOT DETECTED
Barbiturates: NOT DETECTED
Benzodiazepines: NOT DETECTED
Cocaine: NOT DETECTED
Opiates: NOT DETECTED
Tetrahydrocannabinol: NOT DETECTED

## 2024-07-18 LAB — CBC
HCT: 38.1 % (ref 36.0–46.0)
Hemoglobin: 12.3 g/dL (ref 12.0–15.0)
MCH: 31.1 pg (ref 26.0–34.0)
MCHC: 32.3 g/dL (ref 30.0–36.0)
MCV: 96.2 fL (ref 80.0–100.0)
Platelets: 170 K/uL (ref 150–400)
RBC: 3.96 MIL/uL (ref 3.87–5.11)
RDW: 13.4 % (ref 11.5–15.5)
WBC: 5.3 K/uL (ref 4.0–10.5)
nRBC: 0 % (ref 0.0–0.2)

## 2024-07-18 LAB — COMPREHENSIVE METABOLIC PANEL WITH GFR
ALT: 21 U/L (ref 0–44)
AST: 31 U/L (ref 15–41)
Albumin: 4 g/dL (ref 3.5–5.0)
Alkaline Phosphatase: 82 U/L (ref 38–126)
Anion gap: 8 (ref 5–15)
BUN: 18 mg/dL (ref 8–23)
CO2: 26 mmol/L (ref 22–32)
Calcium: 9.4 mg/dL (ref 8.9–10.3)
Chloride: 104 mmol/L (ref 98–111)
Creatinine, Ser: 1.13 mg/dL — ABNORMAL HIGH (ref 0.44–1.00)
GFR, Estimated: 48 mL/min — ABNORMAL LOW (ref >60)
Glucose, Bld: 140 mg/dL — ABNORMAL HIGH (ref 70–99)
Potassium: 4.4 mmol/L (ref 3.5–5.1)
Sodium: 138 mmol/L (ref 135–145)
Total Bilirubin: 0.9 mg/dL (ref 0.0–1.2)
Total Protein: 6.9 g/dL (ref 6.5–8.1)

## 2024-07-18 LAB — ETHANOL: Alcohol, Ethyl (B): <15 mg/dL (ref <15)

## 2024-07-18 LAB — PROTIME-INR
INR: 1 (ref 0.8–1.2)
Prothrombin Time: 14 s (ref 11.4–15.2)

## 2024-07-18 LAB — APTT: aPTT: 29 s (ref 24–36)

## 2024-07-18 NOTE — Telephone Encounter (Signed)
 3 attempts. Unable to reach pt. Please advise.

## 2024-07-18 NOTE — Telephone Encounter (Signed)
 FYI Only or Action Required?: Action required by provider: request for appointment.  Patient was last seen in primary care on 04/05/2024 by Hope Merle, MD.  Called Nurse Triage reporting Loss of Vision.  Symptoms began yesterday.  Interventions attempted: Nothing.  Symptoms are: unchanged.Loss of vision to right eye that comes and goes. Headache and eye pain at times.  Triage Disposition: Go to ED Now (or PCP Triage)  Patient/caregiver understands and will follow disposition?: Yes   Reason for Disposition  Patient sounds very sick or weak to the triager  Answer Assessment - Initial Assessment Questions 1. DESCRIPTION: How has your vision changed? (e.g., complete vision loss, blurred vision, double vision, floaters, etc.)     No vision - comes and goes 2. LOCATION: One or both eyes? If one, ask: Which eye?     Right eye 3. SEVERITY: Can you see anything? If Yes, ask: What can you see? (e.g., fine print)     Comes and goes , completely black 4. ONSET: When did this begin? Did it start suddenly or has this been gradual?     Last night 5. PATTERN: Does this come and go, or has it been constant since it started?     Comes and goes 6. PAIN: Is there any pain in your eye(s)?  (Scale 1-10; or mild, moderate, severe)     Headache and eye pain at times 7. CONTACTS-GLASSES: Do you wear contacts or glasses?     glasses 8. CAUSE: What do you think is causing this visual problem?     unsure 9. OTHER SYMPTOMS: Do you have any other symptoms? (e.g., confusion, headache, arm or leg weakness, speech problems)     no 10. PREGNANCY: Is there any chance you are pregnant? When was your last menstrual period?       no  Protocols used: Vision Loss or Change-A-AH

## 2024-07-18 NOTE — ED Notes (Signed)
 Awaiting patient from lobby.

## 2024-07-18 NOTE — ED Notes (Signed)
 CCMD called by this NT.

## 2024-07-18 NOTE — Telephone Encounter (Signed)
 Called and spoke to pt son he is on the way to take her to the ER now to be checked out. Will call and update us .

## 2024-07-18 NOTE — ED Notes (Signed)
 CCMD called by this RN

## 2024-07-18 NOTE — ED Notes (Signed)
 Pt discharged. Pt given discharge papers and papers explained. Pt in NAD at this time

## 2024-07-18 NOTE — Telephone Encounter (Signed)
 Copied from CRM [id number]. Topic: Clinical - Red Word Triage >> Jul 18, 2024  9:37 AM Mesmerise C wrote: [facility] that prompted transfer to Nurse Triage: Suzanne Leon from [place] advising she can't see out of her left eye and hypertension   Suzanne Leon DARLYN Glenn NP visit: mentioned couldn't see out of left eye. H/O stroke- BP 148/67. Marty not with pt. Called cell and home phone. LM on VM to mobile phone, busy signal to home phone.

## 2024-07-18 NOTE — ED Provider Triage Note (Signed)
 Emergency Medicine Provider Triage Evaluation Note  Suzanne Leon , a 83 y.o. female  was evaluated in triage.  Pt complains of intermittent R eye vision loss. She states last night she struck her head when she leaned over and had 2-3 episodes of vision loss since then. No headache.   No weakness  Review of Systems  Positive: Vision change Negative: Fever   Physical Exam  BP (!) 140/76 (BP Location: Right Arm)   Pulse (!) 59   Temp 98.2 F (36.8 C)   Resp 15   Ht 5' 7 (1.702 m)   Wt 47.6 kg   SpO2 100%   BMI 16.45 kg/m  Gen:   Awake, no distress   Resp:  Normal effort  MSK:   Moves extremities without difficulty  Other:  Tremors at rest. Smile symmetric. Moves all 4 extremities. Nml sensation  Medical Decision Making  Medically screening exam initiated at 12:41 PM.  Appropriate orders placed.  Suzanne Leon was informed that the remainder of the evaluation will be completed by another provider, this initial triage assessment does not replace that evaluation, and the importance of remaining in the ED until their evaluation is complete.  CT head, labs   Neldon Hamp RAMAN, [place] 07/18/24 1252

## 2024-07-18 NOTE — ED Notes (Addendum)
 Denies numbness/tingling. Hx of cataract surgery

## 2024-07-18 NOTE — ED Triage Notes (Signed)
 Pt bib POV accompanied by son c/o right sided vision loss on and off. Pt states last night her vision has been going in and out. Pt confirms a sinus headache that started last night as well.

## 2024-07-18 NOTE — Discharge Instructions (Addendum)
 It was a pleasure caring for you today in the emergency department.  Please go to Dr. Ubaldo office tomorrow 1 PM for eye exam  Please return to the emergency department for any worsening or worrisome symptoms.

## 2024-07-18 NOTE — Group Note (Deleted)
 Date:  07/18/2024 Time:  2:22 PM  Group Topic/Focus:  Wellness Toolbox:   The focus of this group is to discuss various aspects of wellness, balancing those aspects and exploring ways to increase the ability to experience wellness.  Patients will create a wellness toolbox for use upon discharge.     Participation Level:  {BHH PARTICIPATION OZCZO:77735}  Participation Quality:  {BHH PARTICIPATION QUALITY:22265}  Affect:  {BHH AFFECT:22266}  Cognitive:  {BHH COGNITIVE:22267}  Insight: {BHH Insight2:20797}  Engagement in Group:  {BHH ENGAGEMENT IN HMNLE:77731}  Modes of Intervention:  {BHH MODES OF INTERVENTION:22269}  Additional Comments:  ***  Suzanne Leon 07/18/2024, 2:22 PM

## 2024-07-18 NOTE — ED Provider Notes (Signed)
 [facility]MENT AT [facility] Provider Note  CSN: [id number] Arrival date & time: 07/18/24 1122  Chief Complaint(s) Loss of Vision  HPI Suzanne Leon is a 83 y.o. female with past medical history as below, significant for cerebellar stroke, vertigo, hypertension, essential tremor, HLD who presents to the ED with complaint of transient vision abnormality  Patient is a poor historian, she is accompanied by her son.  Reports yesterday evening she was attempting to get something other fridge rater and hit her head on one of the shelves.  She had no LOC.  She had headache and try to go to bed when she got to her bedroom she felt as though her right eye vision was very abnormal.  She is having difficulty describing the details of her vision changes, she feels like she still had vision to her eye but she was unable to identify the object she was seeing in her right eye, she thought they were somewhat blurry and perhaps more colorful than normal.  No vision abnormalities to the left eye.  The symptoms lasted for a good a while and that resolved.  She is unable to give a more accurate duration of the symptoms.  She thinks possibly symptoms are worse when she try to get up to go to the bathroom.  She is unsure if the symptoms are similar to her prior episodes of vertigo.  When she woke up this morning her symptoms had resolved. Denies any pain to her eye or pain with eye movement.  She denies similar symptoms the past.  Does report prior cataract surgery multiple years ago.  Normal state of health prior to onset of the symptoms.  Past Medical History Past Medical History:  Diagnosis Date   Alopecia totalis 10/27/2016   Anxiety    Arthritis    Chronic headaches    Depression    Dizziness    Educated about COVID-19 virus infection 04/02/2019   Essential tremor    HEAD. takes propranolol  as needed.   Fatigue    GERD (gastroesophageal reflux disease)    HLD  (hyperlipidemia)    Internal hemorrhoids    Low back pain    Osteopenia    Osteoporosis    Other specified disorders of nose and nasal sinuses 07/22/2017   Pharyngeal dysphagia 10/06/2023   Skin cancer    skin cancer   Weight loss 10/19/2018   Patient Active Problem List   Diagnosis Date Noted   Acute viral pharyngitis 01/17/2024   Dizziness 11/20/2023   History of cerebellar stroke 11/09/2023   History of arthritis 10/02/2023   GERD (gastroesophageal reflux disease) 08/28/2023   Constipation 08/28/2023   Vitamin B 12 deficiency 08/28/2023   Vitamin D  deficiency 08/28/2023   Syncope and collapse 08/14/2021   Loop recorder: Biotronik 08/13/2021 for synocpe 08/14/2021   PVC (premature ventricular contraction) 04/02/2019   Globus sensation 10/19/2018   Essential hypertension 10/19/2018   Weight loss 10/19/2018   Insomnia 10/27/2016   Essential tremor 10/27/2016   Home Medication(s) Prior to Admission medications   Medication Sig Start Date End Date Taking? Authorizing Provider  pantoprazole  (PROTONIX ) 40 MG tablet Take 1 tablet (40 mg total) by mouth daily. 04/11/24   [place], PA-C  acetaminophen  (TYLENOL ) 500 MG tablet Take 1 tablet (500 mg total) by mouth 2 (two) times daily as needed. 04/05/24   Hope Merle, MD  amLODipine  (NORVASC ) 2.5 MG tablet Take 1 tablet (2.5 mg total) by mouth every evening. 04/13/24  Hope Merle, MD  cholecalciferol (VITAMIN D3) 10 MCG/ML LIQD oral liquid Take 400 Units by mouth daily. Takes 8 drops daily  800 international unit daily    [provider]  fluticasone  (FLONASE ) 50 MCG/ACT nasal spray Place 2 sprays into both nostrils daily. 06/11/24   Bair, Kalpana, MD  polyethylene glycol powder (GLYCOLAX /MIRALAX ) 17 GM/SCOOP powder Take 17 g by mouth daily. 10/10/23   [place], PA-C  SV MAGNESIUM PO Take 249 mg by mouth daily.    [provider]                                                                                                                                     Past Surgical History Past Surgical History:  Procedure Laterality Date   ABDOMINAL HYSTERECTOMY     APPENDECTOMY     COLONOSCOPY     ESOPHAGEAL MANOMETRY  12/18/2012   Procedure: ESOPHAGEAL MANOMETRY (EM);  Surgeon: Princella CHRISTELLA Nida, MD;  Location: [facility];  Service: Endoscopy;  Laterality: N/A;   ESOPHAGOGASTRODUODENOSCOPY (EGD) WITH PROPOFOL  N/A 06/15/2021   Procedure: ESOPHAGOGASTRODUODENOSCOPY (EGD) WITH PROPOFOL ;  Surgeon: Therisa Bi, MD;  Location: [facility] ENDOSCOPY;  Service: Gastroenterology;  Laterality: N/A;   EYE SURGERY     LAMINECTOMY     Loop explant     Explanted Biomonitor III loop recorder 10/12/2022   LOOP RECORDER IMPLANT  08/13/2021   Biotronik Biomonitor III   LUMBAR SPINE SURGERY     2010   remote T&A     TONSILLECTOMY     Family History Family History  Problem Relation Age of Onset   Throat cancer Mother 63   Ulcers Father        peptic   Emphysema Father    Breast cancer Daughter    Thyroid  disease Daughter    Migraines Son    Diabetes Neg Hx     Social History Social History   Tobacco Use   Smoking status: Former    Current packs/day: 0.00    Average packs/day: 0.3 packs/day for 3.0 years (0.8 ttl pk-yrs)    Types: Cigarettes    Start date: 60    Quit date: 1960    Years since quitting: 65.6   Smokeless tobacco: Never   Tobacco comments:    when she was a teenager  Vaping Use   Vaping status: Never Used  Substance Use Topics   Alcohol  use: No   Drug use: No   Allergies Crestor [rosuvastatin], Fosamax [alendronate], Mysoline [primidone], Pepcid  [famotidine ], Prolia  [denosumab ], Prozac [fluoxetine], Sinemet [carbidopa-levodopa], Vitamin d  analogs, and Zocor [simvastatin]  Review of Systems A thorough review of systems was obtained and all systems are negative except as noted in the HPI and PMH.   Physical Exam Vital Signs  I have reviewed the triage vital signs BP (!) 165/82    Pulse (!) 52   Temp (!) 97.3 F (36.3 C) (Oral)  Resp 16   Ht 5' 7 (1.702 m)   Wt 47.6 kg   SpO2 100%   BMI 16.45 kg/m  Physical Exam Vitals and nursing note reviewed.  Constitutional:      General: She is not in acute distress.    Appearance: Normal appearance.  HENT:     Head: Normocephalic and atraumatic.     Jaw: There is normal jaw occlusion. No trismus.      Right Ear: External ear normal.     Left Ear: External ear normal.     Nose: Nose normal.     Mouth/Throat:     Mouth: Mucous membranes are moist.  Eyes:     General: No visual field deficit or scleral icterus.       Right eye: No discharge.        Left eye: No discharge.     Extraocular Movements: Extraocular movements intact.     Pupils: Pupils are equal, round, and reactive to light.  Cardiovascular:     Rate and Rhythm: Normal rate and regular rhythm.     Pulses: Normal pulses.     Heart sounds: Normal heart sounds.  Pulmonary:     Effort: Pulmonary effort is normal. No respiratory distress.     Breath sounds: Normal breath sounds. No stridor.  Abdominal:     General: Abdomen is flat. There is no distension.     Palpations: Abdomen is soft.     Tenderness: There is no abdominal tenderness.  Musculoskeletal:     Cervical back: No rigidity.     Right lower leg: No edema.     Left lower leg: No edema.  Skin:    General: Skin is warm and dry.     Capillary Refill: Capillary refill takes less than 2 seconds.  Neurological:     Mental Status: She is alert and oriented to person, place, and time. Mental status is at baseline.     GCS: GCS eye subscore is 4. GCS verbal subscore is 5. GCS motor subscore is 6.     Cranial Nerves: Cranial nerves 2-12 are intact. No dysarthria or facial asymmetry.     Sensory: Sensation is intact. No sensory deficit.     Motor: Tremor present. No weakness.     Coordination: Coordination is intact.     Comments: Strength 5/5 to BLUE/BLLE, equal and symmetric  Gait testing  deferred secondary to patient safety.   Psychiatric:        Mood and Affect: Mood normal.        Behavior: Behavior normal. Behavior is cooperative.     ED Results and Treatments Labs (all labs ordered are listed, but only abnormal results are displayed) Labs Reviewed  COMPREHENSIVE METABOLIC PANEL WITH GFR - Abnormal; Notable for the following components:      Result Value   Glucose, Bld 140 (*)    Creatinine, Ser 1.13 (*)    GFR, Estimated 48 (*)    All other components within normal limits  I-STAT CHEM 8, ED - Abnormal; Notable for the following components:   Creatinine, Ser 1.10 (*)    Glucose, Bld 141 (*)    All other components within normal limits  ETHANOL  PROTIME-INR  APTT  CBC  DIFFERENTIAL  RAPID URINE DRUG SCREEN, HOSP PERFORMED  Radiology MR BRAIN WO CONTRAST Result Date: 07/18/2024 EXAM: MRI BRAIN WITHOUT CONTRAST 07/18/2024 06:11:00 PM TECHNIQUE: Multiplanar multisequence MRI of the head/brain was performed without the administration of intravenous contrast. COMPARISON: CT of the head dated 07/18/2024. CLINICAL HISTORY: Neuro deficit, acute, stroke suspected; intermittent vision loss. FINDINGS: BRAIN AND VENTRICLES: No acute infarct. No intracranial hemorrhage. No mass. No midline shift. No hydrocephalus. Chronic encephalomalacia changes within the right cerebellar hemisphere. Moderate diffuse cerebral volume loss, preferentially involving the occipital and temporal lobes. Moderately advanced periventricular and deep cerebral white matter disease. The sella is unremarkable. Normal flow voids. ORBITS: The patient is status post bilateral lens replacement. SINUSES AND MASTOIDS: Left mastoid air cell effusion. BONES AND SOFT TISSUES: Normal marrow signal. No acute soft tissue abnormality. IMPRESSION: 1. No acute intracranial abnormality. 2. Chronic  encephalomalacia changes within the right cerebellar hemisphere. 3. Moderate diffuse cerebral volume loss, preferentially involving the occipital and temporal lobes. 4. Moderately advanced periventricular and deep cerebral white matter disease. Electronically signed by: evalene coho 07/18/2024 07:57 PM EDT RP Workstation: [id number]   CT Head Wo Contrast Result Date: 07/18/2024 EXAM: CT HEAD WITHOUT CONTRAST 07/18/2024 01:16:00 PM TECHNIQUE: CT of the head was performed without the administration of intravenous contrast. Automated exposure control, iterative reconstruction, and/or weight based adjustment of the mA/kV was utilized to reduce the radiation dose to as low as reasonably achievable. COMPARISON: MRI head 11/10/23 CLINICAL HISTORY: Neuro deficit, acute, stroke suspected. Pt complains of intermittent R eye vision loss. She states last night she struck her head when she leaned over and had 2-3 episodes of vision loss since then. No headache. FINDINGS: BRAIN AND VENTRICLES: Remote infarct in the right cerebellum. Similar age-related pericomal volume loss. Atrophy is more advanced in the left temporo-occipital lobes with associated dilation of the ventricle. Nonspecific hypoattenuation in the periventricular and subcortical white matter, most likely representing chronic small vessel disease. ORBITS: Bilateral lens replacement. SINUSES: No acute abnormality. SOFT TISSUES AND SKULL: No acute soft tissue abnormality. No skull fracture. IMPRESSION: 1. No acute intracranial abnormality. 2. Remote infarct in the right cerebellum. 3. Atrophy in the left temporo-occipital lobes with associated dilation of the ventricle. 4. Chronic small vessel disease. Electronically signed by: Donnice Mania MD 07/18/2024 02:15 PM EDT RP Workstation: [id number]    Pertinent labs & imaging results that were available during my care of the patient were reviewed by me and considered in my medical decision making (see MDM for  details).  Medications Ordered in ED Medications - No data to display                                                                                                                                   Procedures Procedures  (including critical care time)  Medical Decision Making / ED Course    Medical Decision Making:    DIONICIA CERRITOS is a 83 y.o. female  with past medical history as below,  significant for cerebellar stroke, vertigo, hypertension, essential tremor, HLD who presents to the ED with complaint of transient vision abnormality. The complaint involves an extensive differential diagnosis and also carries with it a high risk of complications and morbidity.  Serious etiology was considered. Ddx includes but is not limited to: CVA, TIA, orbital trauma, CRAO, CRVO, acute glaucoma, conjunctivitis, ocular migraine, etc  Complete initial physical exam performed, notably the patient was in no acute distress, at baseline per family.    Reviewed and confirmed nursing documentation for past medical history, family history, social history.  Vital signs reviewed.    Transient vision change > - transient vision change following minor head trauma last night. Symptoms have since resolved - pt is poor historian, she is having a very difficult time accurately describing her symptoms. Does not appear as though she ever had any vision loss overnight more so abnormal vision that was perhaps blurry or more colorful than normal but she again is not really sure.  - CTH stable MRI brain w/o stable - labs stable - her neuro exam is nonfocal - Spoke with ophthalmology Dr Charmayne, recommend pt come to office tomorrow at 1300 for evaluation  - Patient and family are agreeable to plan.      Patient in no distress and overall condition is stable. Detailed discussions were had with the patient/guardian regarding current findings, and need for close f/u with PCP or on call doctor. The patient/guardian has  been instructed to return immediately if the symptoms worsen in any way for re-evaluation. Patient/guardian verbalized understanding and is in agreement with current care plan. All questions answered prior to discharge.                Additional history obtained: -Additional history obtained from son in law -External records from outside source obtained and reviewed including: Chart review including previous notes, labs, imaging, consultation notes including  Primary care documentation, home medications, prior labs and imaging   Lab Tests: -I ordered, reviewed, and interpreted labs.   The pertinent results include:   Labs Reviewed  COMPREHENSIVE METABOLIC PANEL WITH GFR - Abnormal; Notable for the following components:      Result Value   Glucose, Bld 140 (*)    Creatinine, Ser 1.13 (*)    GFR, Estimated 48 (*)    All other components within normal limits  I-STAT CHEM 8, ED - Abnormal; Notable for the following components:   Creatinine, Ser 1.10 (*)    Glucose, Bld 141 (*)    All other components within normal limits  ETHANOL  PROTIME-INR  APTT  CBC  DIFFERENTIAL  RAPID URINE DRUG SCREEN, HOSP PERFORMED    Notable for labs are stable  EKG   EKG Interpretation Date/Time:  Wednesday July 18 2024 13:01:38 EDT Ventricular Rate:  63 PR Interval:  146 QRS Duration:  78 QT Interval:  416 QTC Calculation: 425 R Axis:   -62  Text Interpretation: Normal sinus rhythm Left axis deviation Abnormal ECG When compared with ECG of 01-Apr-2024 13:42, PREVIOUS ECG IS PRESENT Interpretation limited secondary to artifact no stemi Confirmed by Elnor Savant ([id number]) on 07/18/2024 7:23:46 PM         Imaging Studies ordered: I ordered imaging studies including CT head, MRI brain without I independently visualized the following imaging with scope of interpretation limited to determining acute life threatening conditions related to emergency care; findings noted above I agree with  the radiologist interpretation If any imaging was obtained with contrast I  closely monitored patient for any possible adverse reaction a/w contrast administration in the emergency department   Medicines ordered and prescription drug management: No orders of the defined types were placed in this encounter.   -I have reviewed the patients home medicines and have made adjustments as needed   Consultations Obtained: I requested consultation with the ophthalmology,  and discussed lab and imaging findings as well as pertinent plan - they recommend: Follow-up in office   Cardiac Monitoring: The patient was maintained on a cardiac monitor.  I personally viewed and interpreted the cardiac monitored which showed an underlying rhythm of: NSR Continuous pulse oximetry interpreted by myself, 100% on RA.    Social Determinants of Health:  Diagnosis or treatment significantly limited by social determinants of health: former smoker   Reevaluation: After the interventions noted above, I reevaluated the patient and found that they have resolved  Co morbidities that complicate the patient evaluation  Past Medical History:  Diagnosis Date   Alopecia totalis 10/27/2016   Anxiety    Arthritis    Chronic headaches    Depression    Dizziness    Educated about COVID-19 virus infection 04/02/2019   Essential tremor    HEAD. takes propranolol  as needed.   Fatigue    GERD (gastroesophageal reflux disease)    HLD (hyperlipidemia)    Internal hemorrhoids    Low back pain    Osteopenia    Osteoporosis    Other specified disorders of nose and nasal sinuses 07/22/2017   Pharyngeal dysphagia 10/06/2023   Skin cancer    skin cancer   Weight loss 10/19/2018      Dispostion: Disposition decision including need for hospitalization was considered, and patient discharged from emergency department.    Final Clinical Impression(s) / ED Diagnoses Final diagnoses:  Abnormal vision  Minor head  injury, initial encounter        Elnor Jayson LABOR, DO 07/18/24 2038

## 2024-07-19 DIAGNOSIS — H5310 Unspecified subjective visual disturbances: Secondary | ICD-10-CM | POA: Diagnosis not present

## 2024-08-03 ENCOUNTER — Ambulatory Visit: Payer: Self-pay

## 2024-08-03 NOTE — Telephone Encounter (Signed)
 Pt has been scheduled to see Dr. Abbey on 08/14/2024

## 2024-08-03 NOTE — Telephone Encounter (Signed)
 FYI Only or Action Required?: Action required by provider: update on patient condition.  Patient was last seen in primary care on 04/05/2024 by Hope Merle, MD.  Called Nurse Triage reporting Eye Problem.  Symptoms began several weeks ago.  Interventions attempted: Other: ED and ophthalmology evaluation.  Symptoms are: unchanged.  Triage Disposition: See PCP Within 2 Weeks  Patient/caregiver understands and will follow disposition?: Yes  Copied from CRM [id number]. Topic: Clinical - Red Word Triage >> Aug 03, 2024  9:58 AM Franky GRADE wrote: Red Word that prompted transfer to Nurse Triage: Patient's son Jerel is calling because the patient bumped her head on 07/18/2024, she has developed blurred vision in the right eye. Reason for Disposition  [1] Blurred vision or visual changes AND [2] gradual onset (e.g., weeks, months)  Answer Assessment - Initial Assessment Questions 1. DESCRIPTION: How has your vision changed? (e.g., complete vision loss, blurred vision, double vision, floaters, etc.)     Intermittent blurry vision to right eye after bumping her head on refrigerator door on 07/18/2024. Extensive evaluation by ED and ophthalmology No cause of visual disturbance diagnosed 2. LOCATION: One or both eyes? If one, ask: Which eye?     Right eye 3. SEVERITY: Can you see anything? If Yes, ask: What can you see? (e.g., fine print)     Intermittent blurry vision 4. ONSET: When did this begin? Did it start suddenly or has this been gradual?     About two weeks ago, on 07/18/2024 5. PATTERN: Does this come and go, or has it been constant since it started?     Comes and goes 6. PAIN: Is there any pain in your eye(s)?  (Scale 1-10; or mild, moderate, severe)     denies 7. CONTACTS-GLASSES: Do you wear contacts or glasses?     glasses 8. CAUSE: What do you think is causing this visual problem?     Visual disturbance caused after bumping her head 9. OTHER SYMPTOMS: Do you  have any other symptoms? (e.g., confusion, headache, arm or leg weakness, speech problems)     Denies any other symptoms 10. PREGNANCY: Is there any chance you are pregnant? When was your last menstrual period?       N/A  Protocols used: Vision Loss or Change-A-AH

## 2024-08-03 NOTE — Telephone Encounter (Signed)
 I have never seen the patient. Needs OV.  Luke Shade, MD

## 2024-08-07 ENCOUNTER — Ambulatory Visit (INDEPENDENT_AMBULATORY_CARE_PROVIDER_SITE_OTHER)

## 2024-08-07 VITALS — BP 138/80 | HR 68 | Temp 98.3°F | Ht 67.5 in | Wt 103.6 lb

## 2024-08-07 DIAGNOSIS — H539 Unspecified visual disturbance: Secondary | ICD-10-CM

## 2024-08-07 DIAGNOSIS — S46811A Strain of other muscles, fascia and tendons at shoulder and upper arm level, right arm, initial encounter: Secondary | ICD-10-CM | POA: Diagnosis not present

## 2024-08-07 DIAGNOSIS — G243 Spasmodic torticollis: Secondary | ICD-10-CM | POA: Diagnosis not present

## 2024-08-07 DIAGNOSIS — M810 Age-related osteoporosis without current pathological fracture: Secondary | ICD-10-CM | POA: Insufficient documentation

## 2024-08-07 NOTE — Assessment & Plan Note (Signed)
 Likely contributing to right trapezius muscle spasm. Defer to plan per trapezius strain.

## 2024-08-07 NOTE — Progress Notes (Signed)
 Acute Office Visit  Subjective:    Patient ID: Suzanne Leon, female    DOB: 12-10-1940, 83 y.o.   MRN: [id number]  Chief Complaint  Patient presents with   Eye Problem    HPI Discussed the use of AI scribe software for clinical note transcription with the patient, who gave verbal consent to proceed.  History of Present Illness Patient is here with her son Suzanne Leon, history obtained from both patient and her son. Patient presenting for ED follow up (sen at ED on 07/18/2024) for ongoing vision problems in her right eye following head trauma.  - 07/18/24:  Bumped her head on the refrigerator door causing onset of intermittent right vision change, pain. Patient continues to complain of pain right eye, primarily in the mornings and evenings. She experiences some pain on the right side of her head, but her eyes do not hurt significantly today.   - Was seen at ED on 07/18/24, underwent MRI: moderately advanced periventricular and deep cerebral white matter disease, chronic encephalomalacia (old infract) within right cerebellar hemisphere.  CT head remote right cerebellum infarct, left temporo occipital lobes atrophy with associated dilation of ventricle, chronic vessel disease. Patient was referred to ophthalmologist  Suzanne Molly, MD and was evaluated on 07/19/24. Eye exam was reassuring during this office visit.    - Patient has a h/o dementia, cervical dystonia/tremor. She has seen Dr. Maree at [facility] (last visit 02/2024).   - Patient has sensitivity to medications. She take Protonix  only as needed as she has noted diarrhea when taking it in the past.   - On Amlo 2.5 mg once daily for BP control. Has h/o myalgias to crestor, simvastatin/zocor, fatigue to (carbidopa-levodopa), Primidone made her feel dizzy. Suspected to have s/e to Famotidine  (dizziness).   - Her caregiver reports that she manages daily activities well, such as taking care of flowers and cooking, but has experienced  some memory issues, such as losing items.  ROS As per HPI    Objective:    BP 138/80 (BP Location: Right Arm, Patient Position: Sitting, Cuff Size: Small)   Pulse 68   Temp 98.3 F (36.8 C) (Oral)   Ht 5' 7.5 (1.715 m)   Wt 103 lb 9.6 oz (47 kg)   SpO2 99%   BMI 15.99 kg/m    Physical Exam Constitutional:      General: She is not in acute distress. HENT:     Head: Normocephalic and atraumatic.     Right Ear: Tympanic membrane and external ear normal.     Left Ear: Tympanic membrane and external ear normal.     Mouth/Throat:     Mouth: Mucous membranes are moist.  Eyes:     General: No scleral icterus.    Pupils: Pupils are equal, round, and reactive to light.  Neck:     Comments: Pain on palpation over right trapezius muscle, spasm noted. Cervical tremor noted. Cardiovascular:     Rate and Rhythm: Normal rate.  Pulmonary:     Breath sounds: No wheezing.  Abdominal:     Tenderness: There is no guarding.  Lymphadenopathy:     Cervical: No cervical adenopathy.  Skin:    General: Skin is warm.  Neurological:     Mental Status: She is alert and oriented to person, place, and time.     Cranial Nerves: No facial asymmetry.     Coordination: Coordination is intact.     Gait: Gait normal.     No  results found for any visits on 08/07/24.     Assessment & Plan:   Trapezius strain, right, initial encounter Assessment & Plan: Cervical dystonia with right-sided neck muscle spasm and pain could be contributing to headache.  - Apply Icy Hot to affected area. - Perform gentle massage to right neck muscle 2-3 times daily. - Use heating pad to relax muscle. - Follow up with neurologist for cervical dystonia management and propranolol  alternatives.   Cervical dystonia Assessment & Plan: Likely contributing to right trapezius muscle spasm. Defer to plan per trapezius strain.    Vision changes Assessment & Plan: Intermittent vision changes as mentioned by the patient  but during today's visit she declines change in vision.  ED imaging from 07/18/24 reviewed:   MRI: moderately advanced periventricular and deep cerebral white matter disease, chronic encephalomalacia (old infract) within right cerebellar hemisphere.   CT head: remote right cerebellum infarct, left temporo occipital lobes atrophy with associated dilation of ventricle, chronic vessel disease.  Patient f/u with Dr. Lyles/ophthalmologist with reassuring eye exam on 07/19/24.  Differential includes muscle spasm from cervical dystonia, dementia, visual hallucination.  Avoid further head injury. Recommend repeat eye exam, also recommend follow up with Dr. Maree to rule out neurological cause for vision changes.       Return in about 2 months (around 10/07/2024) for Transfer of care with Dr. Abbey .  Suzanne Abbey, MD

## 2024-08-07 NOTE — Assessment & Plan Note (Signed)
 Cervical dystonia with right-sided neck muscle spasm and pain could be contributing to headache.  - Apply Icy Hot to affected area. - Perform gentle massage to right neck muscle 2-3 times daily. - Use heating pad to relax muscle. - Follow up with neurologist for cervical dystonia management and propranolol  alternatives.

## 2024-08-07 NOTE — Assessment & Plan Note (Signed)
 Intermittent vision changes as mentioned by the patient but during today's visit she declines change in vision.  ED imaging from 07/18/24 reviewed:   MRI: moderately advanced periventricular and deep cerebral white matter disease, chronic encephalomalacia (old infract) within right cerebellar hemisphere.   CT head: remote right cerebellum infarct, left temporo occipital lobes atrophy with associated dilation of ventricle, chronic vessel disease.  Patient f/u with Dr. Lyles/ophthalmologist with reassuring eye exam on 07/19/24.  Differential includes muscle spasm from cervical dystonia, dementia, visual hallucination.  Avoid further head injury. Recommend repeat eye exam, also recommend follow up with Dr. Maree to rule out neurological cause for vision changes.

## 2024-08-07 NOTE — Patient Instructions (Signed)
-   Try Icy-hot 2-3 times a day, gentle massage to the spastic muscle, use heating pad. If vision change, recommend repeat eye exam.  - Schedule follow up appointment with Dr. Maree if there's concern for cognitive decline.  - Follow up with me in 2 months for TOC.

## 2024-08-14 ENCOUNTER — Ambulatory Visit

## 2024-10-15 DIAGNOSIS — Z809 Family history of malignant neoplasm, unspecified: Secondary | ICD-10-CM | POA: Diagnosis not present

## 2024-10-15 DIAGNOSIS — I700 Atherosclerosis of aorta: Secondary | ICD-10-CM | POA: Diagnosis not present

## 2024-10-15 DIAGNOSIS — E039 Hypothyroidism, unspecified: Secondary | ICD-10-CM | POA: Diagnosis not present

## 2024-10-15 DIAGNOSIS — R636 Underweight: Secondary | ICD-10-CM | POA: Diagnosis not present

## 2024-10-15 DIAGNOSIS — G250 Essential tremor: Secondary | ICD-10-CM | POA: Diagnosis not present

## 2024-10-15 DIAGNOSIS — K219 Gastro-esophageal reflux disease without esophagitis: Secondary | ICD-10-CM | POA: Diagnosis not present

## 2024-10-15 DIAGNOSIS — R001 Bradycardia, unspecified: Secondary | ICD-10-CM | POA: Diagnosis not present

## 2024-10-15 DIAGNOSIS — M810 Age-related osteoporosis without current pathological fracture: Secondary | ICD-10-CM | POA: Diagnosis not present

## 2024-10-15 DIAGNOSIS — I10 Essential (primary) hypertension: Secondary | ICD-10-CM | POA: Diagnosis not present

## 2024-10-15 DIAGNOSIS — I2510 Atherosclerotic heart disease of native coronary artery without angina pectoris: Secondary | ICD-10-CM | POA: Diagnosis not present

## 2024-11-07 ENCOUNTER — Ambulatory Visit: Payer: Self-pay

## 2024-11-07 ENCOUNTER — Ambulatory Visit

## 2024-11-07 VITALS — BP 120/70 | HR 61 | Temp 97.9°F | Ht 67.5 in | Wt 105.2 lb

## 2024-11-07 DIAGNOSIS — Z13820 Encounter for screening for osteoporosis: Secondary | ICD-10-CM | POA: Insufficient documentation

## 2024-11-07 DIAGNOSIS — I10 Essential (primary) hypertension: Secondary | ICD-10-CM

## 2024-11-07 DIAGNOSIS — Z681 Body mass index (BMI) 19 or less, adult: Secondary | ICD-10-CM | POA: Insufficient documentation

## 2024-11-07 DIAGNOSIS — E559 Vitamin D deficiency, unspecified: Secondary | ICD-10-CM

## 2024-11-07 DIAGNOSIS — R09A2 Foreign body sensation, throat: Secondary | ICD-10-CM

## 2024-11-07 DIAGNOSIS — R634 Abnormal weight loss: Secondary | ICD-10-CM

## 2024-11-07 DIAGNOSIS — K5900 Constipation, unspecified: Secondary | ICD-10-CM | POA: Diagnosis not present

## 2024-11-07 DIAGNOSIS — G243 Spasmodic torticollis: Secondary | ICD-10-CM | POA: Diagnosis not present

## 2024-11-07 DIAGNOSIS — Z780 Asymptomatic menopausal state: Secondary | ICD-10-CM | POA: Diagnosis not present

## 2024-11-07 LAB — VITAMIN B12: Vitamin B-12: 255 pg/mL (ref 211–911)

## 2024-11-07 LAB — COMPREHENSIVE METABOLIC PANEL WITH GFR
ALT: 12 U/L (ref 0–35)
AST: 21 U/L (ref 0–37)
Albumin: 4.3 g/dL (ref 3.5–5.2)
Alkaline Phosphatase: 81 U/L (ref 39–117)
BUN: 16 mg/dL (ref 6–23)
CO2: 31 meq/L (ref 19–32)
Calcium: 9.5 mg/dL (ref 8.4–10.5)
Chloride: 103 meq/L (ref 96–112)
Creatinine, Ser: 1.05 mg/dL (ref 0.40–1.20)
GFR: 49.12 mL/min — ABNORMAL LOW (ref >60.00)
Glucose, Bld: 94 mg/dL (ref 70–99)
Potassium: 4.7 meq/L (ref 3.5–5.1)
Sodium: 141 meq/L (ref 135–145)
Total Bilirubin: 0.6 mg/dL (ref 0.2–1.2)
Total Protein: 6.7 g/dL (ref 6.0–8.3)

## 2024-11-07 LAB — VITAMIN D 25 HYDROXY (VIT D DEFICIENCY, FRACTURES): VITD: 24.03 ng/mL — ABNORMAL LOW (ref 30.00–100.00)

## 2024-11-07 LAB — CBC WITH DIFFERENTIAL/PLATELET
Basophils Absolute: 0 K/uL (ref 0.0–0.1)
Basophils Relative: 0.5 % (ref 0.0–3.0)
Eosinophils Absolute: 0.1 K/uL (ref 0.0–0.7)
Eosinophils Relative: 1.5 % (ref 0.0–5.0)
HCT: 37.1 % (ref 36.0–46.0)
Hemoglobin: 12.4 g/dL (ref 12.0–15.0)
Lymphocytes Relative: 39.8 % (ref 12.0–46.0)
Lymphs Abs: 1.7 K/uL (ref 0.7–4.0)
MCHC: 33.5 g/dL (ref 30.0–36.0)
MCV: 93.1 fl (ref 78.0–100.0)
Monocytes Absolute: 0.3 K/uL (ref 0.1–1.0)
Monocytes Relative: 7.3 % (ref 3.0–12.0)
Neutro Abs: 2.2 K/uL (ref 1.4–7.7)
Neutrophils Relative %: 50.9 % (ref 43.0–77.0)
Platelets: 168 K/uL (ref 150.0–400.0)
RBC: 3.99 Mil/uL (ref 3.87–5.11)
RDW: 14 % (ref 11.5–15.5)
WBC: 4.3 K/uL (ref 4.0–10.5)

## 2024-11-07 LAB — TSH: TSH: 2.15 u[IU]/mL (ref 0.35–5.50)

## 2024-11-07 MED ORDER — VITAMIN B-12 1000 MCG PO TABS: 1000.0000 ug | ORAL_TABLET | Freq: Every day | ORAL | 0 refills | Status: AC

## 2024-11-07 MED ORDER — AMLODIPINE BESYLATE 2.5 MG PO TABS: 2.5000 mg | ORAL_TABLET | Freq: Every evening | ORAL | 3 refills | Status: DC

## 2024-11-07 NOTE — Assessment & Plan Note (Addendum)
 Chronic, previously seen GI, ENT. Stable.  Check B12.  Protonix , famotidine  discontinued due to lack of efficacy.  Continue to monitor, if worsens will consider GI and ENT evaluation again.  Orders:   B12

## 2024-11-07 NOTE — Assessment & Plan Note (Addendum)
 Ongoing despite patient reporting to eat 3 meals a day. BMI 16.23 kg/m2. Recommend intuitionalist evaluation to help assist with diet recommendations to meet nutritional needs and  help with weight.  Orders:   TSH   CBC with Differential/Platelet   Amb ref to Medical Nutrition Therapy-MNT

## 2024-11-07 NOTE — Progress Notes (Signed)
 Established Patient Office Visit   Subjective  Patient ID: Suzanne Leon, female    DOB: 19-Jun-1941  Age: 83 y.o. MRN: [id number]  Chief Complaint  Patient presents with   Establish Care    Discussed the use of AI scribe software for clinical note transcription with the patient, who gave verbal consent to proceed.  History of Present Illness Suzanne Leon is an 83 year old female with cervical dystonia who presents with her son/caregiver Suzanne Leon for transfer of care from previous primary care doctor.   She was last seen by me in 08/2024 for ED follow up:  During this visit patient and her son Suzanne Leon were recommended to follow up with neurologist Dr. Maree for neurological concerns including: discussion of MRI result from ED, CT head  and ongoing cervical dystonia. Deferred to visit note from 08/07/2024. However, patient has not followed up with him yet.   - Chronic globus like sensation, low BMI: Has seen GI and ENT in the past. Ongoing for years. Previously treated with Famotidine , Protonix  which she does not take anymore.She developed dizziness when taking this and has not had dizziness since discontinuing these medications. She reports she takes 3 meals a day. No pain with chewing. She f/u with dentist twice a year.   - Cervical dystonia and reports abnormal neck movements and neck pain. She has not previously engaged in physical therapy for this condition. The caregiver reports that she is not speaking as much as before.  - Her blood pressure readings have been stable, with systolic values ranging from 98 to 128 mmHg and diastolic values from 51 to 70 mmHg. She takes amlodipine  as needed, but has not used it since May 31, 2024. Her heart rate ranges from 53 to 72 bpm.  - She also takes vitamin D  and Miralax  as needed, and uses Tylenol  for pain relief.  - She lives wit her son Suzanne Leon who helps patient to get to doctor's appointments. She gets her son's help in ADL but for most part  takes care of ADLs by herself.     ROS As per HPI    Objective:     BP 120/70 (BP Location: Right Arm, Patient Position: Sitting)   Pulse 61   Temp 97.9 F (36.6 C) (Oral)   Ht 5' 7.5 (1.715 m)   Wt 105 lb 3.2 oz (47.7 kg)   SpO2 97%   BMI 16.23 kg/m      11/07/2024   10:11 AM 08/07/2024    4:41 PM 04/05/2024    9:33 AM  Depression screen PHQ 2/9  Decreased Interest 0 0 0  Down, Depressed, Hopeless 0 0 0  PHQ - 2 Score 0 0 0  Altered sleeping 0 0 1  Tired, decreased energy 0 0 1  Change in appetite 0 0 0  Feeling bad or failure about yourself  0 0 0  Trouble concentrating 0 0 0  Moving slowly or fidgety/restless 0 0 0  Suicidal thoughts 0 0 0  PHQ-9 Score 0 0  2   Difficult doing work/chores Not difficult at all Not difficult at all Not difficult at all     Data saved with a previous flowsheet row definition      11/07/2024   10:11 AM 08/07/2024    4:41 PM 04/05/2024    9:33 AM 01/12/2024   11:28 AM  GAD 7 : Generalized Anxiety Score  Nervous, Anxious, on Edge 0 0 0 0  Control/stop worrying 0  0 0 0  Worry too much - different things 0 0 0 0  Trouble relaxing 0 0 0 0  Restless 0 0 0 0  Easily annoyed or irritable 0 0 0 0  Afraid - awful might happen 0 0 0 0  Total GAD 7 Score 0 0 0 0  Anxiety Difficulty Not difficult at all Not difficult at all Not difficult at all Not difficult at all      11/07/2024   10:11 AM 08/07/2024    4:41 PM 04/05/2024    9:33 AM  Depression screen PHQ 2/9  Decreased Interest 0 0 0  Down, Depressed, Hopeless 0 0 0  PHQ - 2 Score 0 0 0  Altered sleeping 0 0 1  Tired, decreased energy 0 0 1  Change in appetite 0 0 0  Feeling bad or failure about yourself  0 0 0  Trouble concentrating 0 0 0  Moving slowly or fidgety/restless 0 0 0  Suicidal thoughts 0 0 0  PHQ-9 Score 0 0  2   Difficult doing work/chores Not difficult at all Not difficult at all Not difficult at all     Data saved with a previous flowsheet row definition       11/07/2024   10:11 AM 08/07/2024    4:41 PM 04/05/2024    9:33 AM 01/12/2024   11:28 AM  GAD 7 : Generalized Anxiety Score  Nervous, Anxious, on Edge 0 0 0 0  Control/stop worrying 0 0 0 0  Worry too much - different things 0 0 0 0  Trouble relaxing 0 0 0 0  Restless 0 0 0 0  Easily annoyed or irritable 0 0 0 0  Afraid - awful might happen 0 0 0 0  Total GAD 7 Score 0 0 0 0  Anxiety Difficulty Not difficult at all Not difficult at all Not difficult at all Not difficult at all   SDOH Screenings   Food Insecurity: No Food Insecurity (07/18/2024)   Received from [facility]  Housing: Low Risk  (07/18/2024)   Received from [facility]  Transportation Needs: No Transportation Needs (07/18/2024)   Received from [facility]  Utilities: Not At Risk (07/18/2024)   Received from [facility]  Alcohol  Screen: Low Risk (02/08/2024)  Depression (PHQ2-9): Low Risk (11/07/2024)  Financial Resource Strain: Low Risk  (07/18/2024)   Received from [facility]  Physical Activity: Insufficiently Active (02/08/2024)  Social Connections: Moderately Isolated (02/08/2024)  Stress: Stress Concern Present (02/08/2024)  Tobacco Use: Medium Risk (11/07/2024)  Health Literacy: Adequate Health Literacy (02/08/2024)     Physical Exam Constitutional:      General: She is not in acute distress.    Comments: Thin appearing, well dressed not in acute distress   HENT:     Head: Normocephalic and atraumatic.     Right Ear: Tympanic membrane and external ear normal. There is no impacted cerumen.     Left Ear: Tympanic membrane and external ear normal. There is no impacted cerumen.     Mouth/Throat:     Mouth: Mucous membranes are moist.     Pharynx: Oropharynx is clear. No oropharyngeal exudate or posterior oropharyngeal erythema.  Neck:     Comments: Involuntary head/neck flexion,extension motion noted. Cranial nerve II-XII grossly intact,  gait is normal. Cardiovascular:     Rate and Rhythm: Normal rate.  Pulmonary:     Effort: Pulmonary effort is normal.  Breath sounds: Normal breath sounds. No wheezing.  Abdominal:     General: Bowel sounds are normal.     Palpations: Abdomen is soft.     Tenderness: There is no abdominal tenderness. There is no guarding.  Musculoskeletal:     Cervical back: No rigidity or torticollis.     Right lower leg: No edema.     Left lower leg: No edema.  Skin:    General: Skin is warm.  Neurological:     Mental Status: She is alert and oriented to person, place, and time.     Comments: Involuntary head/neck flexion,extension motion noted. Cranial nerve II-XII grossly intact, gait is normal.  Psychiatric:        Mood and Affect: Mood normal.        Results for orders placed or performed in visit on 11/07/24  TSH  Result Value Ref Range   TSH 2.15 0.35 - 5.50 uIU/mL  CBC with Differential/Platelet  Result Value Ref Range   WBC 4.3 4.0 - 10.5 K/uL   RBC 3.99 3.87 - 5.11 Mil/uL   Hemoglobin 12.4 12.0 - 15.0 g/dL   HCT 62.8 63.9 - 53.9 %   MCV 93.1 78.0 - 100.0 fl   MCHC 33.5 30.0 - 36.0 g/dL   RDW 85.9 88.4 - 84.4 %   Platelets 168.0 150.0 - 400.0 K/uL   Neutrophils Relative % 50.9 43.0 - 77.0 %   Lymphocytes Relative 39.8 12.0 - 46.0 %   Monocytes Relative 7.3 3.0 - 12.0 %   Eosinophils Relative 1.5 0.0 - 5.0 %   Basophils Relative 0.5 0.0 - 3.0 %   Neutro Abs 2.2 1.4 - 7.7 K/uL   Lymphs Abs 1.7 0.7 - 4.0 K/uL   Monocytes Absolute 0.3 0.1 - 1.0 K/uL   Eosinophils Absolute 0.1 0.0 - 0.7 K/uL   Basophils Absolute 0.0 0.0 - 0.1 K/uL  Comprehensive metabolic panel  Result Value Ref Range   Sodium 141 135 - 145 mEq/L   Potassium 4.7 3.5 - 5.1 mEq/L   Chloride 103 96 - 112 mEq/L   CO2 31 19 - 32 mEq/L   Glucose, Bld 94 70 - 99 mg/dL   BUN 16 6 - 23 mg/dL   Creatinine, Ser 8.94 0.40 - 1.20 mg/dL   Total Bilirubin 0.6 0.2 - 1.2 mg/dL   Alkaline Phosphatase 81 39 - 117 U/L    AST 21 0 - 37 U/L   ALT 12 0 - 35 U/L   Total Protein 6.7 6.0 - 8.3 g/dL   Albumin 4.3 3.5 - 5.2 g/dL   GFR 50.87 (L) >39.99 mL/min   Calcium  9.5 8.4 - 10.5 mg/dL  Vitamin D  (25 hydroxy)  Result Value Ref Range   VITD 24.03 (L) 30.00 - 100.00 ng/mL  B12  Result Value Ref Range   Vitamin B-12 255 211 - 911 pg/mL    The ASCVD Risk score (Arnett DK, et al., 2019) failed to calculate for the following reasons:   The 2019 ASCVD risk score is only valid for ages 31 to 79   Risk score cannot be calculated because patient has a medical history suggesting prior/existing ASCVD   * - Cholesterol units were assumed     Assessment & Plan:  Declined shingles, pneumonia, and flu vaccines. Assessment & Plan Essential hypertension Home BP reading shows normal BP.  Her BP goal is <130/80 mmHg given h/o small vessel disease.  Given age, fragility and BP with in goal I recommend  we stop her Amlodipine  2.5 mg (she last took it in July 2025). We will check CMP. I recommend shw continues to check BP at home and reach out to our clinic if BP is consistently >130/80 mmHg and or headaches.  Son Suzanne Leon and patient both agrees.  Orders:   Comprehensive metabolic panel  Weight loss Ongoing despite patient reporting to eat 3 meals a day. BMI 16.23 kg/m2. Recommend intuitionalist evaluation to help assist with diet recommendations to meet nutritional needs and  help with weight.  Orders:   TSH   CBC with Differential/Platelet   Amb ref to Medical Nutrition Therapy-MNT  Constipation, unspecified constipation type Stable on prn miralax , continue.     Cervical dystonia Chronic cervical dystonia causing neck muscle strain and pain. Offered home physical therapy if she changes her mind. May benefit from follow up with neurologist Dr. Maree, phone number and address to his office provided today. Recommend they call his office to schedule an appointment.     Globus sensation Chronic, previously seen GI,  ENT. Stable.  Check B12.  Protonix , famotidine  discontinued due to lack of efficacy.  Continue to monitor, if worsens will consider GI and ENT evaluation again.  Orders:   B12  Vitamin D  deficiency Known h/o vitamin D  deficiency, currently taking vitamin D  supplement. Repeat vitamin D  level today.  Orders:   Vitamin D  (25 hydroxy)  Body mass index (BMI) less than 16.5 Plan per weight loss. Reach out to our clinic if no phone call in 2 weeks to set an appointment.  Orders:   Amb ref to Medical Nutrition Therapy-MNT  Postmenopausal estrogen deficiency Patient with known h/o low BMI which puts her at risk of fracture. Declined bone density initially but is agreeable to get DEXA. Imaging ordered today. Phone number provided to patient's son Suzanne Leon to call to schedule an appointment.    Orders:   DG Bone Density; Future   I personally spent a total of 45 minutes in the care of the patient today including preparing to see the patient, performing a medically appropriate exam/evaluation, counseling and educating, placing orders, referring and communicating with other health care professionals, documenting clinical information in the EHR, independently interpreting results, and communicating results.  Return in about 5 months (around 04/07/2025) for Chronic follow up .   Luke Shade, MD

## 2024-11-07 NOTE — Assessment & Plan Note (Addendum)
 Known h/o vitamin D  deficiency, currently taking vitamin D  supplement. Repeat vitamin D  level today.  Orders:   Vitamin D  (25 hydroxy)

## 2024-11-07 NOTE — Assessment & Plan Note (Addendum)
 Stable on prn miralax , continue.

## 2024-11-07 NOTE — Assessment & Plan Note (Addendum)
 Patient with known h/o low BMI which puts her at risk of fracture. Declined bone density initially but is agreeable to get DEXA. Imaging ordered today. Phone number provided to patient's son Jerel to call to schedule an appointment.    Orders:   DG Bone Density; Future

## 2024-11-07 NOTE — Assessment & Plan Note (Addendum)
 Home BP reading shows normal BP.  Her BP goal is <130/80 mmHg given h/o small vessel disease.  Given age, fragility and BP with in goal I recommend we stop her Amlodipine  2.5 mg (she last took it in July 2025). We will check CMP. I recommend shw continues to check BP at home and reach out to our clinic if BP is consistently >130/80 mmHg and or headaches.  Son Jerel and patient both agrees.  Orders:   Comprehensive metabolic panel

## 2024-11-07 NOTE — Assessment & Plan Note (Deleted)
 SABRA

## 2024-11-07 NOTE — Assessment & Plan Note (Addendum)
 Plan per weight loss. Reach out to our clinic if no phone call in 2 weeks to set an appointment.  Orders:   Amb ref to Medical Nutrition Therapy-MNT

## 2024-11-07 NOTE — Patient Instructions (Addendum)
-   Please reach out to neurology at [facility] clinic at following address to schedule an appointment:- [name], MD  [phone] [facility] [address]  [facility]  [name], [place] [address]  [phone] (phone number)   - Goal BP is less than 130/80 mmHg. If her BP is consistently over 130/80 on 3-4 days in a row please reach out to our office and we will restart her on Amlodipine  2.5 mg. Hold off on Amlodipine  for now.   - If you change your mind about physical therapy please reach out to me as well.   - I am putting a referral to nutritional specialist today. If you do not hear from them in 2 weeks please reach out to us .   - YOUR BONE DENISTY SCAN (dexa)  IS DUE, PLEASE CALL AND GET THIS SCHEDULED! [facility] - call [phone]   - Follow up in 5 months with Dr. Abbey

## 2024-11-07 NOTE — Assessment & Plan Note (Addendum)
 Chronic cervical dystonia causing neck muscle strain and pain. Offered home physical therapy if she changes her mind. May benefit from follow up with neurologist Dr. Maree, phone number and address to his office provided today. Recommend they call his office to schedule an appointment.

## 2024-11-23 ENCOUNTER — Ambulatory Visit
Admission: RE | Admit: 2024-11-23 | Discharge: 2024-11-23 | Disposition: A | Discharge status: Home / Self Care | Source: Ambulatory Visit

## 2024-11-23 DIAGNOSIS — Z780 Asymptomatic menopausal state: Secondary | ICD-10-CM | POA: Diagnosis present

## 2024-11-23 NOTE — Progress Notes (Signed)
 Talked to patient's son Jerel and updated him on DEXA results, risk of fall, potential treatment options. Given severity of osteoporosis recommend endocrine referral for further evaluation and treatment, Jerel agrees. Also recommend he reaches out to our office if he has not gotten a call to schedule an appointment. Referral to [facility] endo made.   1. Osteoporosis of multiple sites (Primary) - Ambulatory referral to Endocrinology  Luke Shade, MD

## 2024-12-04 ENCOUNTER — Telehealth: Payer: Self-pay

## 2024-12-04 NOTE — Telephone Encounter (Signed)
 Copied from CRM [id number]. Topic: Referral - Question >> Dec 04, 2024  9:06 AM Olam RAMAN wrote: Reason for CRM:  Pt son was calling about ref. stated he called and they told him no ref was sent as of yet: fax:  [phone] Can be re faxed please

## 2024-12-18 ENCOUNTER — Emergency Department (HOSPITAL_COMMUNITY)
Admission: EM | Admit: 2024-12-18 | Discharge: 2024-12-18 | Disposition: A | Discharge status: Home / Self Care | Attending: Emergency Medicine | Admitting: Emergency Medicine

## 2024-12-18 ENCOUNTER — Encounter

## 2024-12-18 ENCOUNTER — Emergency Department (HOSPITAL_COMMUNITY)

## 2024-12-18 ENCOUNTER — Other Ambulatory Visit: Payer: Self-pay

## 2024-12-18 DIAGNOSIS — W208XXA Other cause of strike by thrown, projected or falling object, initial encounter: Secondary | ICD-10-CM | POA: Insufficient documentation

## 2024-12-18 DIAGNOSIS — S90451A Superficial foreign body, right great toe, initial encounter: Secondary | ICD-10-CM | POA: Diagnosis not present

## 2024-12-18 DIAGNOSIS — S9031XA Contusion of right foot, initial encounter: Secondary | ICD-10-CM | POA: Insufficient documentation

## 2024-12-18 DIAGNOSIS — S99921A Unspecified injury of right foot, initial encounter: Secondary | ICD-10-CM | POA: Diagnosis present

## 2024-12-18 MED ORDER — IBUPROFEN 400 MG PO TABS
600.0000 mg | ORAL_TABLET | Freq: Once | ORAL | Status: AC
  Administered 2024-12-18: 600 mg via ORAL
  Filled 2024-12-18: qty 1

## 2024-12-18 NOTE — ED Triage Notes (Signed)
 Son stated, she dropped a piece of wood on her right foot and its swollen and bruised,

## 2024-12-18 NOTE — Discharge Instructions (Addendum)
 Continue ice and Tylenol .  Elevate.  Postop shoe for comfort.  Follow-up with your regular doctor.  Of note there was a foreign body noted on your x-rays at your big toe.  You do not have any signs of a wound or any tenderness or signs of infection in that area so it is possible it is been there a while.

## 2024-12-18 NOTE — ED Provider Triage Note (Signed)
 Emergency Medicine Provider Triage Evaluation Note  Suzanne Leon , a 84 y.o. female  was evaluated in triage.  Pt complains of right foot injury after dropped something on her foot yesterday.  Review of Systems  Positive: Bruising Negative: Wound  Physical Exam  BP 134/75   Pulse 78   Temp 98.1 F (36.7 C)   Resp 16   SpO2 100%  Gen:   Awake, no distress   Resp:  Normal effort  MSK:   Moves extremities without difficulty, tenderness and bruising mid distal right foot.  Palpable pulses.  No open wounds. Other:    Medical Decision Making  Medically screening exam initiated at 8:27 AM.  Appropriate orders placed.  Suzanne Leon was informed that the remainder of the evaluation will be completed by another provider, this initial triage assessment does not replace that evaluation, and the importance of remaining in the ED until their evaluation is complete.     Towana Ozell BROCKS, MD 12/18/24 [phone]

## 2024-12-18 NOTE — ED Provider Notes (Signed)
 " [facility] AT [facility] Provider Note   CSN: [id number] Arrival date & time: 12/18/24  0725     Patient presents with: Foot Injury   Suzanne Leon is a 84 y.o. female.  She is here for evaluation of right foot injury after she dropped something heavy on it yesterday.  There was significant bruising and a moderate amount of pain especially with ambulation.  No other complaints.   The history is provided by the patient and a relative.  Foot Injury Location:  Foot Time since incident:  1 day Injury: yes   Foot location:  Dorsum of R foot Pain details:    Timing:  Constant   Progression:  Unchanged Chronicity:  New Relieved by:  Nothing Worsened by:  Bearing weight Ineffective treatments:  Ice and rest      Prior to Admission medications  Medication Sig Start Date End Date Taking? Authorizing Provider  acetaminophen  (TYLENOL ) 500 MG tablet Take 1 tablet (500 mg total) by mouth 2 (two) times daily as needed. 04/05/24   Walsh, Tanya, MD  cholecalciferol (VITAMIN D3) 10 MCG/ML LIQD oral liquid Take 400 Units by mouth daily. Takes 8 drops daily  800 international unit daily    [provider]  cyanocobalamin  (VITAMIN B12) 1000 MCG tablet Take 1 tablet (1,000 mcg total) by mouth daily. 11/07/24   Bair, Kalpana, MD  polyethylene glycol powder (GLYCOLAX /MIRALAX ) 17 GM/SCOOP powder Take 17 g by mouth daily. 10/10/23   [place], PA-C    Allergies: Crestor [rosuvastatin], Fosamax [alendronate], Mysoline [primidone], Pepcid  [famotidine ], Prolia  [denosumab ], Prozac [fluoxetine], Sinemet [carbidopa-levodopa], Vitamin d  analogs, and Zocor [simvastatin]    Review of Systems  Updated Vital Signs BP (!) 97/56 (BP Location: Left Arm)   Pulse 71   Temp 97.6 F (36.4 C)   Resp 19   SpO2 100%   Physical Exam Constitutional:      Appearance: Normal appearance. She is well-developed.  HENT:     Head: Normocephalic and atraumatic.  Eyes:      Conjunctiva/sclera: Conjunctivae normal.  Musculoskeletal:        General: Tenderness and signs of injury present.     Cervical back: Neck supple.     Comments: Right midfoot with moderate ecchymosis and tenderness.  Ankle and heel nontender.  Digits nontender.  On x-ray patient has a foreign body of her distal phalanx great toe.  I do not see any open wounds there and there is no erythema or tenderness.  Skin:    General: Skin is warm and dry.  Neurological:     General: No focal deficit present.     Mental Status: She is alert.     GCS: GCS eye subscore is 4. GCS verbal subscore is 5. GCS motor subscore is 6.     Sensory: No sensory deficit.     Motor: No weakness.     (all labs ordered are listed, but only abnormal results are displayed) Labs Reviewed - No data to display  EKG: None  Radiology: DG Foot Complete Right Result Date: 12/18/2024 CLINICAL DATA:  Right foot pain and swelling after injury EXAM: RIGHT FOOT COMPLETE - 3+ VIEW COMPARISON:  None Available. FINDINGS: There is no evidence of fracture or dislocation. There is no evidence of arthropathy or other focal bone abnormality. Linear metallic density is seen adjacent the first distal phalanx concerning for retained foreign body. IMPRESSION: No fracture or dislocation is noted. Linear metallic density seen adjacent to first  distal phalanx concerning for retained foreign body. Electronically Signed   By: Lynwood Landy Raddle M.D.   On: 12/18/2024 08:52     Procedures   Medications Ordered in the ED  ibuprofen  (ADVIL ) tablet 600 mg (600 mg Oral Given 12/18/24 0816)                                    Medical Decision Making Amount and/or Complexity of Data Reviewed Radiology: ordered.   This patient complains of right foot injury; this involves an extensive number of treatment Options and is a complaint that carries with it a high risk of complications and morbidity. The differential includes contusion, fracture,  dislocation  I ordered imaging studies which included right foot x-ray and I independently    visualized and interpreted imaging which showed no acute fracture.  Does have possible foreign body great toe Additional history obtained from patient's family member Previous records obtained and reviewed in epic no recent admissions Social determinants considered, increase stress Critical Interventions: None  After the interventions stated above, I reevaluated the patient and found patient to be well-appearing in no distress Admission and further testing considered, no indications for further workup at this time.  Will treat symptomatically with postop shoe and recommended follow-up with PCP.  Return instructions discussed.      Final diagnoses:  Contusion of right foot, initial encounter  Foreign body of skin of right great toe    ED Discharge Orders     None          Towana Ozell BROCKS, MD 12/18/24 1719  "

## 2025-01-02 DIAGNOSIS — K219 Gastro-esophageal reflux disease without esophagitis: Secondary | ICD-10-CM

## 2025-01-02 DIAGNOSIS — R1312 Dysphagia, oropharyngeal phase: Secondary | ICD-10-CM

## 2025-01-03 DIAGNOSIS — R1310 Dysphagia, unspecified: Secondary | ICD-10-CM | POA: Insufficient documentation

## 2025-01-03 NOTE — Telephone Encounter (Signed)
 1. Oropharyngeal dysphagia (Primary) - Ambulatory referral to ENT  2. Laryngopharyngeal reflux (LPR) - Ambulatory referral to ENT   Luke Shade, MD

## 2025-02-12 ENCOUNTER — Ambulatory Visit

## 2025-04-08 ENCOUNTER — Ambulatory Visit
